# Patient Record
Sex: Female | Born: 1955
Health system: Southern US, Community
[De-identification: ages and names within clinical notes are randomized; demographics above are authoritative.]

## PROBLEM LIST (undated history)

## (undated) DIAGNOSIS — E119 Type 2 diabetes mellitus without complications: Secondary | ICD-10-CM

## (undated) DIAGNOSIS — R600 Localized edema: Secondary | ICD-10-CM

## (undated) DIAGNOSIS — E039 Hypothyroidism, unspecified: Secondary | ICD-10-CM

## (undated) DIAGNOSIS — K219 Gastro-esophageal reflux disease without esophagitis: Secondary | ICD-10-CM

## (undated) DIAGNOSIS — Z9289 Personal history of other medical treatment: Secondary | ICD-10-CM

## (undated) DIAGNOSIS — F419 Anxiety disorder, unspecified: Secondary | ICD-10-CM

## (undated) DIAGNOSIS — M1990 Unspecified osteoarthritis, unspecified site: Secondary | ICD-10-CM

## (undated) DIAGNOSIS — E785 Hyperlipidemia, unspecified: Secondary | ICD-10-CM

## (undated) DIAGNOSIS — K829 Disease of gallbladder, unspecified: Secondary | ICD-10-CM

## (undated) DIAGNOSIS — F32A Depression, unspecified: Secondary | ICD-10-CM

## (undated) DIAGNOSIS — R0600 Dyspnea, unspecified: Secondary | ICD-10-CM

## (undated) DIAGNOSIS — C801 Malignant (primary) neoplasm, unspecified: Secondary | ICD-10-CM

## (undated) DIAGNOSIS — K5900 Constipation, unspecified: Secondary | ICD-10-CM

## (undated) DIAGNOSIS — I2699 Other pulmonary embolism without acute cor pulmonale: Secondary | ICD-10-CM

## (undated) DIAGNOSIS — J45909 Unspecified asthma, uncomplicated: Secondary | ICD-10-CM

## (undated) DIAGNOSIS — F329 Major depressive disorder, single episode, unspecified: Secondary | ICD-10-CM

## (undated) DIAGNOSIS — M549 Dorsalgia, unspecified: Secondary | ICD-10-CM

## (undated) DIAGNOSIS — I10 Essential (primary) hypertension: Secondary | ICD-10-CM

## (undated) HISTORY — DX: Dorsalgia, unspecified: M54.9

## (undated) HISTORY — DX: Constipation, unspecified: K59.00

## (undated) HISTORY — DX: Essential (primary) hypertension: I10

## (undated) HISTORY — PX: BACK SURGERY: SHX140

## (undated) HISTORY — DX: Unspecified asthma, uncomplicated: J45.909

## (undated) HISTORY — DX: Disease of gallbladder, unspecified: K82.9

## (undated) HISTORY — PX: HERNIA REPAIR: SHX51

## (undated) HISTORY — DX: Hyperlipidemia, unspecified: E78.5

## (undated) HISTORY — DX: Gastro-esophageal reflux disease without esophagitis: K21.9

## (undated) HISTORY — DX: Localized edema: R60.0

## (undated) HISTORY — PX: TUBAL LIGATION: SHX77

## (undated) HISTORY — PX: OTHER SURGICAL HISTORY: SHX169

## (undated) HISTORY — PX: CYSTECTOMY: SUR359

## (undated) HISTORY — DX: Dyspnea, unspecified: R06.00

## (undated) HISTORY — DX: Type 2 diabetes mellitus without complications: E11.9

---

## 1984-01-15 DIAGNOSIS — I2699 Other pulmonary embolism without acute cor pulmonale: Secondary | ICD-10-CM

## 1984-01-15 HISTORY — DX: Other pulmonary embolism without acute cor pulmonale: I26.99

## 1985-01-14 HISTORY — PX: CHOLECYSTECTOMY: SHX55

## 1988-01-15 DIAGNOSIS — Z9289 Personal history of other medical treatment: Secondary | ICD-10-CM

## 1988-01-15 HISTORY — DX: Personal history of other medical treatment: Z92.89

## 2000-06-17 ENCOUNTER — Ambulatory Visit (HOSPITAL_COMMUNITY): Admission: RE | Admit: 2000-06-17 | Discharge: 2000-06-17 | Payer: Self-pay | Admitting: Specialist

## 2000-06-17 ENCOUNTER — Encounter: Payer: Self-pay | Admitting: Specialist

## 2000-07-30 ENCOUNTER — Other Ambulatory Visit: Admission: RE | Admit: 2000-07-30 | Discharge: 2000-07-30 | Payer: Self-pay | Admitting: Obstetrics and Gynecology

## 2001-10-23 ENCOUNTER — Ambulatory Visit (HOSPITAL_COMMUNITY): Admission: RE | Admit: 2001-10-23 | Discharge: 2001-10-23 | Payer: Self-pay | Admitting: Specialist

## 2001-10-23 ENCOUNTER — Encounter: Payer: Self-pay | Admitting: Specialist

## 2002-01-06 ENCOUNTER — Encounter: Payer: Self-pay | Admitting: General Surgery

## 2002-01-06 ENCOUNTER — Ambulatory Visit (HOSPITAL_COMMUNITY): Admission: RE | Admit: 2002-01-06 | Discharge: 2002-01-06 | Payer: Self-pay | Admitting: General Surgery

## 2002-10-29 ENCOUNTER — Encounter: Payer: Self-pay | Admitting: Specialist

## 2002-10-29 ENCOUNTER — Ambulatory Visit (HOSPITAL_COMMUNITY): Admission: RE | Admit: 2002-10-29 | Discharge: 2002-10-29 | Payer: Self-pay | Admitting: Specialist

## 2002-11-09 ENCOUNTER — Emergency Department (HOSPITAL_COMMUNITY): Admission: EM | Admit: 2002-11-09 | Discharge: 2002-11-09 | Payer: Self-pay | Admitting: Emergency Medicine

## 2003-08-19 ENCOUNTER — Ambulatory Visit (HOSPITAL_COMMUNITY): Admission: RE | Admit: 2003-08-19 | Discharge: 2003-08-19 | Payer: Self-pay | Admitting: General Surgery

## 2003-12-02 ENCOUNTER — Ambulatory Visit (HOSPITAL_COMMUNITY): Admission: RE | Admit: 2003-12-02 | Discharge: 2003-12-02 | Payer: Self-pay | Admitting: Specialist

## 2004-12-07 ENCOUNTER — Ambulatory Visit (HOSPITAL_COMMUNITY): Admission: RE | Admit: 2004-12-07 | Discharge: 2004-12-07 | Payer: Self-pay | Admitting: Obstetrics and Gynecology

## 2005-12-09 ENCOUNTER — Ambulatory Visit (HOSPITAL_COMMUNITY): Admission: RE | Admit: 2005-12-09 | Discharge: 2005-12-09 | Payer: Self-pay | Admitting: Obstetrics and Gynecology

## 2006-02-28 ENCOUNTER — Ambulatory Visit (HOSPITAL_COMMUNITY): Admission: RE | Admit: 2006-02-28 | Discharge: 2006-02-28 | Payer: Self-pay | Admitting: General Surgery

## 2006-05-19 ENCOUNTER — Ambulatory Visit (HOSPITAL_COMMUNITY): Admission: RE | Admit: 2006-05-19 | Discharge: 2006-05-19 | Payer: Self-pay | Admitting: Family Medicine

## 2006-12-12 ENCOUNTER — Ambulatory Visit (HOSPITAL_COMMUNITY): Admission: RE | Admit: 2006-12-12 | Discharge: 2006-12-12 | Payer: Self-pay | Admitting: Obstetrics and Gynecology

## 2007-02-13 ENCOUNTER — Other Ambulatory Visit: Admission: RE | Admit: 2007-02-13 | Discharge: 2007-02-13 | Payer: Self-pay | Admitting: Obstetrics and Gynecology

## 2007-12-16 ENCOUNTER — Ambulatory Visit (HOSPITAL_COMMUNITY): Admission: RE | Admit: 2007-12-16 | Discharge: 2007-12-16 | Payer: Self-pay | Admitting: Obstetrics and Gynecology

## 2008-03-16 ENCOUNTER — Other Ambulatory Visit: Admission: RE | Admit: 2008-03-16 | Discharge: 2008-03-16 | Payer: Self-pay | Admitting: Obstetrics and Gynecology

## 2008-12-16 ENCOUNTER — Ambulatory Visit (HOSPITAL_COMMUNITY): Admission: RE | Admit: 2008-12-16 | Discharge: 2008-12-16 | Payer: Self-pay | Admitting: Obstetrics and Gynecology

## 2009-03-20 ENCOUNTER — Ambulatory Visit (HOSPITAL_COMMUNITY): Admission: RE | Admit: 2009-03-20 | Discharge: 2009-03-20 | Payer: Self-pay | Admitting: Family Medicine

## 2009-08-28 ENCOUNTER — Other Ambulatory Visit: Admission: RE | Admit: 2009-08-28 | Discharge: 2009-08-28 | Payer: Self-pay | Admitting: Obstetrics and Gynecology

## 2009-11-06 ENCOUNTER — Ambulatory Visit (HOSPITAL_COMMUNITY): Admission: RE | Admit: 2009-11-06 | Discharge: 2009-11-06 | Payer: Self-pay | Admitting: Internal Medicine

## 2009-12-18 ENCOUNTER — Ambulatory Visit (HOSPITAL_COMMUNITY)
Admission: RE | Admit: 2009-12-18 | Discharge: 2009-12-18 | Payer: Self-pay | Source: Home / Self Care | Admitting: Obstetrics and Gynecology

## 2010-06-01 NOTE — H&P (Signed)
Crystal Roberts, Crystal Roberts                ACCOUNT NO.:  [id number]   MEDICAL RECORD NO.:  [id number]          PATIENT TYPE:  AMB   LOCATION:  DAY                           FACILITY:  [facility]   PHYSICIAN:  Dalia Heading, M.D.  DATE OF BIRTH:  March 04, 1955   DATE OF ADMISSION:  DATE OF DISCHARGE:  LH                              HISTORY & PHYSICAL   CHIEF COMPLAINT:  Need for screening colonoscopy.   HISTORY OF PRESENT ILLNESS:  The patient is a 55 year old white female  who is referred for endoscopic evaluation.  She needs a colonoscopy for  hematochezia.  She has been having episodes of blood per rectum  intermittently over the past few months.  No abdominal pain, weight  loss, nausea, vomiting, diarrhea, constipation, melena have been noted.  She has never had a colonoscopy.  There is no family history of colon  carcinoma.   PAST MEDICAL HISTORY:  Includes non-insulin dependent diabetes mellitus,  hypothyroidism.   PAST SURGICAL HISTORY:  Cholecystectomy, incisional herniorrhaphy,  excision of cyst on her back, tubal ligation.   CURRENT MEDICATIONS:  Levothyroxine, metformin, hydrochlorothiazide.   ALLERGIES:  SULFA.   REVIEW OF SYSTEMS:  Noncontributory.   PHYSICAL EXAMINATION:  GENERAL APPEARANCE:  The patient is a well-  developed, well-nourished white female in no acute distress.  LUNGS:  Clear to auscultation with equal breath sounds bilaterally.  HEART:  Examination reveals a regular rate and rhythm without S3, S4 or  murmurs.  ABDOMEN:  The abdomen is soft, nontender, nondistended.  No  hepatosplenomegaly or masses are noted.  RECTAL:  Examination was deferred to the procedure.   IMPRESSION:  Need for screening colonoscopy.   PLAN:  The patient is scheduled for a colonoscopy on February 28, 2006.  The risks and benefits of the procedure including bleeding and  perforation were fully explained to the patient, who gave informed  consent.      Dalia Heading, M.D.  Electronically Signed     MAJ/MEDQ  D:  02/13/2006  T:  02/13/2006  Job:  [id number]   cc:   [facility]  Fax: [phone]   Tilda Burrow, M.D.  Fax: [phone]   Corrie Mckusick, M.D.  Fax: [phone]

## 2010-06-01 NOTE — Op Note (Signed)
NAME:  Crystal Roberts, Crystal Roberts                          ACCOUNT NO.:  [id number]   MEDICAL RECORD NO.:  [id number]                   PATIENT TYPE:  AMB   LOCATION:  DAY                                  FACILITY:  [facility]   PHYSICIAN:  Dalia Heading, M.D.               DATE OF BIRTH:  16-Jul-1955   DATE OF PROCEDURE:  08/19/2003  DATE OF DISCHARGE:                                 OPERATIVE REPORT   PREOPERATIVE DIAGNOSIS:  Infected sebaceous cysts, back.   POSTOPERATIVE DIAGNOSIS:  Infected sebaceous cysts, back.   PROCEDURE:  Excision of 3-cm, infected, sebaceous cysts, back.   SURGEON:  Dalia Heading, M.D.   ANESTHESIA:  General endotracheal.   INDICATIONS:  The patient is a 55 year old white female who underwent a  limited incision and drainage of an infected sebaceous cyst.  Due to ongoing  pain and induration, the patient now comes to the operating room for formal  excision of the sebaceous cysts.  The risks and benefits of the procedure  were fully explained to the patient, who gave informed consent.   DESCRIPTION OF PROCEDURE:  The patient was placed in the right lateral  decubitus position after induction of general endotracheal anesthesia.  The  left upper back was prepped and draped using the usual sterile technique  with Betadine.  Surgical site confirmation was performed.   An oblique incision was made to include both areas of induration.  A large  sebaceous cyst with a smaller sebaceous cyst in tandem were removed.  Any  bleeding was controlled using Bovie electrocautery.  Any remaining necrotic  tissue was debrided using a curette.  The wound was injected with 0.5%  Sensorcaine.  Two 4-0 nylon sutures were placed to reapproximate the wound  loosely.  The wound was then packed with iodoform gauze.  A dry sterile  dressing was then applied.   All tape and needle counts were correct at the end of the procedure.  The  patient was extubated in the operating room and went back  to recovery room  awake, in stable condition.   COMPLICATIONS:  None.   SPECIMEN:  Sebaceous cyst, back.   BLOOD LOSS:  Minimal.      ___________________________________________                                            Dalia Heading, M.D.   MAJ/MEDQ  D:  08/19/2003  T:  08/21/2003  Job:  [id number]   cc:   Dalia Heading, M.D.  [address] [place]  [place] [address]  Fax: [phone]   Patrica Duel, M.D.  [address]  [place]  [place] [address]  Fax: [phone]

## 2010-06-01 NOTE — H&P (Signed)
NAME:  Crystal Roberts, Crystal Roberts                          ACCOUNT NO.:  [id number]   MEDICAL RECORD NO.:  [id number]                   PATIENT TYPE:  AMB   LOCATION:  DAY                                  FACILITY:  [facility]   PHYSICIAN:  Dalia Heading, M.D.               DATE OF BIRTH:  Jan 05, 1956   DATE OF ADMISSION:  DATE OF DISCHARGE:                                HISTORY & PHYSICAL   CHIEF COMPLAINT:  Multiple sebaceous cysts, back.   HISTORY OF PRESENT ILLNESS:  The patient is a 55 year old white female  referred for evaluation and treatment of an infected sebaceous cyst on her  back.  This was incised and drained at her primary care physician.  She has  been on antibiotics since that time.  She also has another area of  induration which is presumably secondary to an infected sebaceous cyst.  She  now presents to the operating room for formal excision of both sebaceous  cysts.   PAST MEDICAL HISTORY:  Hypertension.   PAST SURGICAL HISTORY:  1. Cholecystectomy.  2. Herniorrhaphy.   CURRENT MEDICATIONS:  Blood pressure pill, hydrocodone, an aspirin which she  is holding.   ALLERGIES:  SULFA.   REVIEW OF SYSTEMS:  The patient denies drinking or smoking.  She denies any  other cardiopulmonary difficulties or bleeding disorders.   PHYSICAL EXAMINATION:  GENERAL:  The patient is a well-developed, well-  nourished white female in no acute distress.  VITAL SIGNS:  She is afebrile and vital signs are stable.  LUNGS:  Clear to auscultation with equal breath sounds bilaterally.  HEART:  Regular rate and rhythm without S3, S4, or murmurs.  BACK:  Two areas of induration with punctums present.  No drainage is noted  presently.  Both are greater than 2 cm in size.   IMPRESSION:  Resolving infected sebaceous cysts, back.   PLAN:  The patient is scheduled for excision of the sebaceous cysts, back,  on August 19, 2003.  The risks and benefits of the procedure including  bleeding, infection, and  recurrence of the cysts were fully explained to the  patient who gave informed consent.     ___________________________________________                                         Dalia Heading, M.D.   MAJ/MEDQ  D:  08/16/2003  T:  08/16/2003  Job:  [id number]   cc:   Patrica Duel, M.D.  [address]  [place]  [place] [address]  Fax: [phone]

## 2010-11-15 ENCOUNTER — Other Ambulatory Visit: Payer: Self-pay | Admitting: Obstetrics and Gynecology

## 2010-11-15 DIAGNOSIS — Z139 Encounter for screening, unspecified: Secondary | ICD-10-CM

## 2010-12-25 ENCOUNTER — Ambulatory Visit (HOSPITAL_COMMUNITY)
Admission: RE | Admit: 2010-12-25 | Discharge: 2010-12-25 | Disposition: A | Discharge status: Home / Self Care | Payer: BC Managed Care – PPO | Source: Ambulatory Visit | Attending: Obstetrics and Gynecology | Admitting: Obstetrics and Gynecology

## 2010-12-25 DIAGNOSIS — Z139 Encounter for screening, unspecified: Secondary | ICD-10-CM

## 2010-12-25 DIAGNOSIS — Z1231 Encounter for screening mammogram for malignant neoplasm of breast: Secondary | ICD-10-CM | POA: Insufficient documentation

## 2010-12-26 ENCOUNTER — Other Ambulatory Visit: Payer: Self-pay | Admitting: Obstetrics and Gynecology

## 2010-12-26 ENCOUNTER — Other Ambulatory Visit (HOSPITAL_COMMUNITY)
Admission: RE | Admit: 2010-12-26 | Discharge: 2010-12-26 | Disposition: A | Discharge status: Home / Self Care | Payer: BC Managed Care – PPO | Source: Ambulatory Visit | Attending: Obstetrics and Gynecology | Admitting: Obstetrics and Gynecology

## 2010-12-26 DIAGNOSIS — Z01419 Encounter for gynecological examination (general) (routine) without abnormal findings: Secondary | ICD-10-CM | POA: Insufficient documentation

## 2011-12-17 ENCOUNTER — Other Ambulatory Visit (HOSPITAL_COMMUNITY): Payer: Self-pay | Admitting: Family Medicine

## 2011-12-17 DIAGNOSIS — Z139 Encounter for screening, unspecified: Secondary | ICD-10-CM

## 2011-12-27 ENCOUNTER — Ambulatory Visit (HOSPITAL_COMMUNITY)
Admission: RE | Admit: 2011-12-27 | Discharge: 2011-12-27 | Disposition: A | Discharge status: Home / Self Care | Payer: BC Managed Care – PPO | Source: Ambulatory Visit | Attending: Family Medicine | Admitting: Family Medicine

## 2011-12-27 DIAGNOSIS — Z1231 Encounter for screening mammogram for malignant neoplasm of breast: Secondary | ICD-10-CM | POA: Insufficient documentation

## 2011-12-27 DIAGNOSIS — Z139 Encounter for screening, unspecified: Secondary | ICD-10-CM

## 2012-05-26 ENCOUNTER — Ambulatory Visit (HOSPITAL_COMMUNITY)
Admission: RE | Admit: 2012-05-26 | Discharge: 2012-05-26 | Disposition: A | Discharge status: Home / Self Care | Payer: BC Managed Care – PPO | Source: Ambulatory Visit | Attending: Family Medicine | Admitting: Family Medicine

## 2012-05-26 ENCOUNTER — Other Ambulatory Visit (HOSPITAL_COMMUNITY): Payer: Self-pay | Admitting: Family Medicine

## 2012-05-26 DIAGNOSIS — M25532 Pain in left wrist: Secondary | ICD-10-CM

## 2012-05-26 DIAGNOSIS — M25539 Pain in unspecified wrist: Secondary | ICD-10-CM | POA: Insufficient documentation

## 2012-11-17 ENCOUNTER — Other Ambulatory Visit (HOSPITAL_COMMUNITY): Payer: Self-pay | Admitting: Family Medicine

## 2012-11-17 DIAGNOSIS — Z139 Encounter for screening, unspecified: Secondary | ICD-10-CM

## 2012-12-07 ENCOUNTER — Other Ambulatory Visit: Payer: Self-pay | Admitting: Obstetrics and Gynecology

## 2012-12-07 ENCOUNTER — Encounter: Payer: Self-pay | Admitting: *Deleted

## 2012-12-28 ENCOUNTER — Ambulatory Visit (HOSPITAL_COMMUNITY): Payer: BC Managed Care – PPO

## 2012-12-30 ENCOUNTER — Other Ambulatory Visit: Payer: Self-pay | Admitting: Obstetrics and Gynecology

## 2013-01-20 ENCOUNTER — Other Ambulatory Visit (HOSPITAL_COMMUNITY)
Admission: RE | Admit: 2013-01-20 | Discharge: 2013-01-20 | Disposition: A | Discharge status: Home / Self Care | Payer: BC Managed Care – PPO | Source: Ambulatory Visit | Attending: Obstetrics and Gynecology | Admitting: Obstetrics and Gynecology

## 2013-01-20 ENCOUNTER — Encounter (INDEPENDENT_AMBULATORY_CARE_PROVIDER_SITE_OTHER): Payer: Self-pay

## 2013-01-20 ENCOUNTER — Encounter: Payer: Self-pay | Admitting: Obstetrics and Gynecology

## 2013-01-20 ENCOUNTER — Ambulatory Visit (INDEPENDENT_AMBULATORY_CARE_PROVIDER_SITE_OTHER): Payer: BC Managed Care – PPO | Admitting: Obstetrics and Gynecology

## 2013-01-20 VITALS — BP 142/80 | Ht 65.0 in | Wt 256.0 lb

## 2013-01-20 DIAGNOSIS — Z01419 Encounter for gynecological examination (general) (routine) without abnormal findings: Secondary | ICD-10-CM

## 2013-01-20 DIAGNOSIS — Z1151 Encounter for screening for human papillomavirus (HPV): Secondary | ICD-10-CM | POA: Insufficient documentation

## 2013-01-20 DIAGNOSIS — Z1212 Encounter for screening for malignant neoplasm of rectum: Secondary | ICD-10-CM

## 2013-01-20 LAB — HEMOCCULT GUIAC POC 1CARD (OFFICE): FECAL OCCULT BLD: NEGATIVE

## 2013-01-20 NOTE — Progress Notes (Signed)
Patient ID: Crystal Roberts, female   DOB: February 04, 1955, 58 y.o.   MRN: [id number]  Assessment:  Annual Gyn Exam   Plan:  1. pap smear done, next pap due 3 yr 2. return annually or prn 3    Annual mammogram advised Subjective:  Crystal Roberts is a 58 y.o. female No obstetric history on file. who presents for annual exam. No LMP recorded. Patient is postmenopausal. The patient has complaints today of weight gain x 30 lb / 1 yr. Diet change acknowledged   The following portions of the patient's history were reviewed and updated as appropriate: allergies, current medications, past family history, past medical history, past social history, past surgical history and problem list.  Review of Systems Constitutional: negative Gastrointestinal: negative Genitourinary: Denies stress incontinence pelvic discomfort other than the mild lower abdominal ache nonspecific, no postmenopausal bleeding  Objective:  There were no vitals taken for this visit. weight 256 pounds pulse 70 blood pressure 142/80 height 65 inches  BMI: There is no height or weight on file to calculate BMI.  General Appearance: Alert, appropriate appearance for age. No acute distress HEENT: Grossly normal  crackling lower lip from fever blister, being treated Neck / Thyroid:  Cardiovascular: RRR; normal S1, S2, no murmur Lungs: CTA bilaterally Back: No CVAT Breast Exam: No dimpling, nipple retraction or discharge. No masses or nodes. and right breast slightly larger than left, unchanged, axilla negative Gastrointestinal: Soft, non-tender, no masses or organomegaly Pelvic Exam: Vulva and vagina appear normal. Bimanual exam reveals normal uterus and adnexa. Good support Rectovaginal: normal rectal, no masses and guaiac negative stool obtained Lymphatic Exam: Non-palpable nodes in neck, clavicular, axillary, or inguinal regions Skin: no rash or abnormalities Neurologic: Normal gait and speech, no tremor  Psychiatric: Alert and  oriented, appropriate affect.  Urinalysis:Not done  Mallory Shirk. MD Pgr [phone] 8:33 AM

## 2013-01-20 NOTE — Patient Instructions (Signed)
Your BMI is 42 please buy a step meter

## 2013-02-02 ENCOUNTER — Ambulatory Visit (HOSPITAL_COMMUNITY)
Admission: RE | Admit: 2013-02-02 | Discharge: 2013-02-02 | Disposition: A | Discharge status: Home / Self Care | Payer: BC Managed Care – PPO | Source: Ambulatory Visit | Attending: Family Medicine | Admitting: Family Medicine

## 2013-02-02 DIAGNOSIS — Z139 Encounter for screening, unspecified: Secondary | ICD-10-CM

## 2013-02-02 DIAGNOSIS — Z1231 Encounter for screening mammogram for malignant neoplasm of breast: Secondary | ICD-10-CM | POA: Insufficient documentation

## 2014-01-10 ENCOUNTER — Other Ambulatory Visit (HOSPITAL_COMMUNITY): Payer: Self-pay | Admitting: Family Medicine

## 2014-01-10 DIAGNOSIS — Z1231 Encounter for screening mammogram for malignant neoplasm of breast: Secondary | ICD-10-CM

## 2014-01-24 ENCOUNTER — Other Ambulatory Visit: Payer: BC Managed Care – PPO | Admitting: Obstetrics and Gynecology

## 2014-02-03 ENCOUNTER — Ambulatory Visit (HOSPITAL_COMMUNITY): Payer: BC Managed Care – PPO

## 2014-02-03 ENCOUNTER — Other Ambulatory Visit: Payer: Self-pay | Admitting: Obstetrics and Gynecology

## 2014-04-01 ENCOUNTER — Ambulatory Visit (HOSPITAL_COMMUNITY)
Admission: RE | Admit: 2014-04-01 | Discharge: 2014-04-01 | Disposition: A | Discharge status: Home / Self Care | Payer: BC Managed Care – PPO | Source: Ambulatory Visit | Attending: Family Medicine | Admitting: Family Medicine

## 2014-04-01 DIAGNOSIS — Z1231 Encounter for screening mammogram for malignant neoplasm of breast: Secondary | ICD-10-CM | POA: Insufficient documentation

## 2015-03-20 MED FILL — BUPROPION HCL XL 150 MG TAB: 150 | 90 days supply | Qty: 90 | Fill #0

## 2015-03-20 MED FILL — SIMVASTATIN 40 MG TABLET: 40 | 90 days supply | Qty: 90 | Fill #0

## 2015-03-20 MED FILL — NAPROXEN 500 MG TABLET: 500 | 30 days supply | Qty: 60 | Fill #0

## 2015-03-21 ENCOUNTER — Other Ambulatory Visit (HOSPITAL_COMMUNITY): Payer: Self-pay | Admitting: Family Medicine

## 2015-03-21 DIAGNOSIS — Z1231 Encounter for screening mammogram for malignant neoplasm of breast: Secondary | ICD-10-CM

## 2015-04-04 MED FILL — LOSARTAN-HCTZ 100-25 MG TAB: 100-25 | 90 days supply | Qty: 90 | Fill #0

## 2015-04-04 MED FILL — FREESTYLE LITE TEST STRIP: 25 days supply | Qty: 50 | Fill #0

## 2015-04-04 MED FILL — MELOXICAM 15 MG TABLET: 15 | 90 days supply | Qty: 90 | Fill #0

## 2015-04-07 ENCOUNTER — Ambulatory Visit (HOSPITAL_COMMUNITY)
Admission: RE | Admit: 2015-04-07 | Discharge: 2015-04-07 | Disposition: A | Discharge status: Home / Self Care | Payer: 59 | Source: Ambulatory Visit | Attending: Family Medicine | Admitting: Family Medicine

## 2015-04-07 DIAGNOSIS — Z1231 Encounter for screening mammogram for malignant neoplasm of breast: Secondary | ICD-10-CM | POA: Diagnosis not present

## 2015-04-09 DIAGNOSIS — E119 Type 2 diabetes mellitus without complications: Secondary | ICD-10-CM | POA: Diagnosis not present

## 2015-04-11 MED FILL — metFORMIN HCL 500 MG TABS: 500 | 30 days supply | Qty: 120 | Fill #0

## 2015-04-20 DIAGNOSIS — I10 Essential (primary) hypertension: Secondary | ICD-10-CM | POA: Diagnosis not present

## 2015-04-20 DIAGNOSIS — Z6841 Body Mass Index (BMI) 40.0 and over, adult: Secondary | ICD-10-CM | POA: Diagnosis not present

## 2015-04-20 DIAGNOSIS — J209 Acute bronchitis, unspecified: Secondary | ICD-10-CM | POA: Diagnosis not present

## 2015-04-20 DIAGNOSIS — E119 Type 2 diabetes mellitus without complications: Secondary | ICD-10-CM | POA: Diagnosis not present

## 2015-04-20 DIAGNOSIS — J069 Acute upper respiratory infection, unspecified: Secondary | ICD-10-CM | POA: Diagnosis not present

## 2015-04-20 DIAGNOSIS — Z1389 Encounter for screening for other disorder: Secondary | ICD-10-CM | POA: Diagnosis not present

## 2015-04-20 MED FILL — CYCLOBENZAPRINE 10 MG TAB: 10 | 20 days supply | Qty: 60 | Fill #0

## 2015-04-26 MED FILL — LEVOTHYROXINE 75 MCG TABLET: 75 | 90 days supply | Qty: 90 | Fill #0

## 2015-05-26 MED FILL — BUPROPION HCL XL 300 MG TAB: 300 | 90 days supply | Qty: 90 | Fill #0

## 2015-05-29 MED FILL — metFORMIN HCL 500 MG TABS: 500 | 30 days supply | Qty: 120 | Fill #1

## 2015-06-20 MED FILL — SIMVASTATIN 40 MG TABLET: 40 | 90 days supply | Qty: 90 | Fill #1

## 2015-07-03 MED FILL — MELOXICAM 15 MG TABLET: 15 | 90 days supply | Qty: 90 | Fill #1

## 2015-07-03 MED FILL — metFORMIN HCL 500 MG TABS: 500 | 30 days supply | Qty: 120 | Fill #0

## 2015-07-25 MED FILL — LOSARTAN-HCTZ 100-25 MG TAB: 100-25 | 90 days supply | Qty: 90 | Fill #1

## 2015-07-25 MED FILL — LEVOTHYROXINE 75 MCG TABLET: 75 | 90 days supply | Qty: 90 | Fill #1

## 2015-08-21 MED FILL — BUPROPION HCL XL 300 MG TAB: 300 | 90 days supply | Qty: 90 | Fill #1

## 2015-08-23 MED FILL — metFORMIN HCL 500 MG TABS: 500 | 30 days supply | Qty: 120 | Fill #0

## 2015-08-28 DIAGNOSIS — M310 Hypersensitivity angiitis: Secondary | ICD-10-CM | POA: Diagnosis not present

## 2015-08-28 DIAGNOSIS — Z1389 Encounter for screening for other disorder: Secondary | ICD-10-CM | POA: Diagnosis not present

## 2015-08-28 DIAGNOSIS — M9903 Segmental and somatic dysfunction of lumbar region: Secondary | ICD-10-CM | POA: Diagnosis not present

## 2015-08-28 DIAGNOSIS — M5432 Sciatica, left side: Secondary | ICD-10-CM | POA: Diagnosis not present

## 2015-08-28 DIAGNOSIS — E119 Type 2 diabetes mellitus without complications: Secondary | ICD-10-CM | POA: Diagnosis not present

## 2015-08-28 DIAGNOSIS — Z6841 Body Mass Index (BMI) 40.0 and over, adult: Secondary | ICD-10-CM | POA: Diagnosis not present

## 2015-08-28 DIAGNOSIS — M791 Myalgia: Secondary | ICD-10-CM | POA: Diagnosis not present

## 2015-08-28 DIAGNOSIS — M9904 Segmental and somatic dysfunction of sacral region: Secondary | ICD-10-CM | POA: Diagnosis not present

## 2015-09-01 DIAGNOSIS — M9903 Segmental and somatic dysfunction of lumbar region: Secondary | ICD-10-CM | POA: Diagnosis not present

## 2015-09-01 DIAGNOSIS — M9904 Segmental and somatic dysfunction of sacral region: Secondary | ICD-10-CM | POA: Diagnosis not present

## 2015-09-01 DIAGNOSIS — M791 Myalgia: Secondary | ICD-10-CM | POA: Diagnosis not present

## 2015-09-01 DIAGNOSIS — M5432 Sciatica, left side: Secondary | ICD-10-CM | POA: Diagnosis not present

## 2015-09-08 DIAGNOSIS — M9904 Segmental and somatic dysfunction of sacral region: Secondary | ICD-10-CM | POA: Diagnosis not present

## 2015-09-08 DIAGNOSIS — M9903 Segmental and somatic dysfunction of lumbar region: Secondary | ICD-10-CM | POA: Diagnosis not present

## 2015-09-08 DIAGNOSIS — M791 Myalgia: Secondary | ICD-10-CM | POA: Diagnosis not present

## 2015-09-08 DIAGNOSIS — M5432 Sciatica, left side: Secondary | ICD-10-CM | POA: Diagnosis not present

## 2015-09-19 MED FILL — SIMVASTATIN 40 MG TABLET: 40 | 30 days supply | Qty: 30 | Fill #2

## 2015-10-02 DIAGNOSIS — L708 Other acne: Secondary | ICD-10-CM | POA: Diagnosis not present

## 2015-10-02 DIAGNOSIS — Z08 Encounter for follow-up examination after completed treatment for malignant neoplasm: Secondary | ICD-10-CM | POA: Diagnosis not present

## 2015-10-02 DIAGNOSIS — Z85828 Personal history of other malignant neoplasm of skin: Secondary | ICD-10-CM | POA: Diagnosis not present

## 2015-10-05 MED FILL — MELOXICAM 15 MG TABLET: 15 | 30 days supply | Qty: 30 | Fill #0

## 2015-10-05 MED FILL — metFORMIN HCL 500 MG TABS: 500 | 30 days supply | Qty: 120 | Fill #0

## 2015-10-17 MED FILL — SIMVASTATIN 40 MG TABLET: 40 | 90 days supply | Qty: 90 | Fill #0

## 2015-10-18 MED FILL — LOSARTAN-HCTZ 100-25 MG TAB: 100-25 | 30 days supply | Qty: 30 | Fill #2

## 2015-10-26 DIAGNOSIS — Z6841 Body Mass Index (BMI) 40.0 and over, adult: Secondary | ICD-10-CM | POA: Diagnosis not present

## 2015-10-26 DIAGNOSIS — E039 Hypothyroidism, unspecified: Secondary | ICD-10-CM | POA: Diagnosis not present

## 2015-10-26 DIAGNOSIS — I10 Essential (primary) hypertension: Secondary | ICD-10-CM | POA: Diagnosis not present

## 2015-10-26 DIAGNOSIS — E782 Mixed hyperlipidemia: Secondary | ICD-10-CM | POA: Diagnosis not present

## 2015-10-26 DIAGNOSIS — E119 Type 2 diabetes mellitus without complications: Secondary | ICD-10-CM | POA: Diagnosis not present

## 2015-10-26 DIAGNOSIS — Z1389 Encounter for screening for other disorder: Secondary | ICD-10-CM | POA: Diagnosis not present

## 2015-10-26 MED FILL — CYCLOBENZAPRINE 10 MG TAB: 10 | 20 days supply | Qty: 60 | Fill #1

## 2015-11-02 MED FILL — LEVOTHYROXINE 75 MCG TABLET: 75 | 90 days supply | Qty: 90 | Fill #2

## 2015-11-03 MED FILL — MELOXICAM 15 MG TABLET: 15 | 30 days supply | Qty: 30 | Fill #0

## 2015-11-17 MED FILL — metFORMIN HCL 500 MG TABS: 500 | 30 days supply | Qty: 120 | Fill #0

## 2015-11-20 MED FILL — BUPROPION HCL XL 300 MG TAB: 300 | 90 days supply | Qty: 90 | Fill #2

## 2015-11-21 MED FILL — LOSARTAN-HCTZ 100-25 MG TAB: 100-25 | 90 days supply | Qty: 90 | Fill #0

## 2015-12-11 MED FILL — MELOXICAM 15 MG TABLET: 15 | 30 days supply | Qty: 30 | Fill #0

## 2015-12-26 MED FILL — metFORMIN HCL 500 MG TABS: 500 | 30 days supply | Qty: 120 | Fill #1

## 2016-01-17 MED FILL — MELOXICAM 15 MG TABLET: 15 | 30 days supply | Qty: 30 | Fill #0

## 2016-01-29 MED FILL — SIMVASTATIN 40 MG TABLET: 40 | 90 days supply | Qty: 90 | Fill #1

## 2016-01-29 MED FILL — LEVOTHYROXINE 75 MCG TABLET: 75 | 90 days supply | Qty: 90 | Fill #0

## 2016-02-06 MED FILL — metFORMIN HCL 500 MG TABS: 500 | 30 days supply | Qty: 120 | Fill #0

## 2016-02-15 MED FILL — LOSARTAN-HCTZ 100-25 MG TAB: 100-25 | 90 days supply | Qty: 90 | Fill #1

## 2016-02-15 MED FILL — MELOXICAM 15 MG TABLET: 15 | 30 days supply | Qty: 30 | Fill #1

## 2016-03-04 MED FILL — BUPROPION HCL XL 300 MG TAB: 300 | 90 days supply | Qty: 90 | Fill #3

## 2016-03-11 DIAGNOSIS — Z1389 Encounter for screening for other disorder: Secondary | ICD-10-CM | POA: Diagnosis not present

## 2016-03-11 DIAGNOSIS — J209 Acute bronchitis, unspecified: Secondary | ICD-10-CM | POA: Diagnosis not present

## 2016-03-11 DIAGNOSIS — R070 Pain in throat: Secondary | ICD-10-CM | POA: Diagnosis not present

## 2016-03-11 DIAGNOSIS — J343 Hypertrophy of nasal turbinates: Secondary | ICD-10-CM | POA: Diagnosis not present

## 2016-03-11 DIAGNOSIS — Z681 Body mass index (BMI) 19 or less, adult: Secondary | ICD-10-CM | POA: Diagnosis not present

## 2016-03-11 DIAGNOSIS — E063 Autoimmune thyroiditis: Secondary | ICD-10-CM | POA: Diagnosis not present

## 2016-03-11 DIAGNOSIS — J069 Acute upper respiratory infection, unspecified: Secondary | ICD-10-CM | POA: Diagnosis not present

## 2016-03-11 DIAGNOSIS — E782 Mixed hyperlipidemia: Secondary | ICD-10-CM | POA: Diagnosis not present

## 2016-03-11 DIAGNOSIS — E119 Type 2 diabetes mellitus without complications: Secondary | ICD-10-CM | POA: Diagnosis not present

## 2016-03-11 MED FILL — OSELTAMIVIR PHOSPHATE 75 MG: 75 | 5 days supply | Qty: 10 | Fill #0

## 2016-03-11 MED FILL — AMOX TR-K CLV 875-125 MG TA: 875-125 | 10 days supply | Qty: 20 | Fill #0

## 2016-03-20 MED FILL — MELOXICAM 15 MG TABLET: 15 | 30 days supply | Qty: 30 | Fill #2

## 2016-03-21 MED FILL — metFORMIN HCL 500 MG TABS: 500 | 30 days supply | Qty: 120 | Fill #0

## 2016-04-24 DIAGNOSIS — M5412 Radiculopathy, cervical region: Secondary | ICD-10-CM | POA: Diagnosis not present

## 2016-04-24 DIAGNOSIS — Z6841 Body Mass Index (BMI) 40.0 and over, adult: Secondary | ICD-10-CM | POA: Diagnosis not present

## 2016-04-24 DIAGNOSIS — M542 Cervicalgia: Secondary | ICD-10-CM | POA: Diagnosis not present

## 2016-04-24 DIAGNOSIS — Z1389 Encounter for screening for other disorder: Secondary | ICD-10-CM | POA: Diagnosis not present

## 2016-04-24 MED FILL — CYCLOBENZAPRINE 10 MG TAB: 10 | 20 days supply | Qty: 60 | Fill #0

## 2016-04-25 ENCOUNTER — Other Ambulatory Visit (HOSPITAL_COMMUNITY): Payer: Self-pay | Admitting: Family Medicine

## 2016-04-25 DIAGNOSIS — M5412 Radiculopathy, cervical region: Secondary | ICD-10-CM

## 2016-04-25 DIAGNOSIS — M542 Cervicalgia: Secondary | ICD-10-CM

## 2016-05-01 ENCOUNTER — Ambulatory Visit (HOSPITAL_COMMUNITY)
Admission: RE | Admit: 2016-05-01 | Discharge: 2016-05-01 | Disposition: A | Discharge status: Home / Self Care | Payer: 59 | Source: Ambulatory Visit | Attending: Family Medicine | Admitting: Family Medicine

## 2016-05-01 DIAGNOSIS — M542 Cervicalgia: Secondary | ICD-10-CM | POA: Diagnosis not present

## 2016-05-01 DIAGNOSIS — R221 Localized swelling, mass and lump, neck: Secondary | ICD-10-CM | POA: Diagnosis not present

## 2016-05-01 DIAGNOSIS — M5412 Radiculopathy, cervical region: Secondary | ICD-10-CM | POA: Diagnosis not present

## 2016-05-03 DIAGNOSIS — E119 Type 2 diabetes mellitus without complications: Secondary | ICD-10-CM | POA: Diagnosis not present

## 2016-05-03 DIAGNOSIS — Z6839 Body mass index (BMI) 39.0-39.9, adult: Secondary | ICD-10-CM | POA: Diagnosis not present

## 2016-05-03 DIAGNOSIS — I10 Essential (primary) hypertension: Secondary | ICD-10-CM | POA: Diagnosis not present

## 2016-05-03 DIAGNOSIS — M542 Cervicalgia: Secondary | ICD-10-CM | POA: Diagnosis not present

## 2016-05-03 DIAGNOSIS — Z1389 Encounter for screening for other disorder: Secondary | ICD-10-CM | POA: Diagnosis not present

## 2016-05-03 DIAGNOSIS — E063 Autoimmune thyroiditis: Secondary | ICD-10-CM | POA: Diagnosis not present

## 2016-05-03 DIAGNOSIS — E039 Hypothyroidism, unspecified: Secondary | ICD-10-CM | POA: Diagnosis not present

## 2016-05-03 DIAGNOSIS — E782 Mixed hyperlipidemia: Secondary | ICD-10-CM | POA: Diagnosis not present

## 2016-05-06 MED FILL — LEVOTHYROXINE 75 MCG TABLET: 75 | 90 days supply | Qty: 90 | Fill #1

## 2016-05-07 MED FILL — metFORMIN HCL 500 MG TABS: 500 | 90 days supply | Qty: 360 | Fill #0

## 2016-05-07 MED FILL — SIMVASTATIN 40 MG TABLET: 40 | 90 days supply | Qty: 90 | Fill #0

## 2016-05-07 MED FILL — MELOXICAM 15 MG TABLET: 15 | 90 days supply | Qty: 90 | Fill #0

## 2016-05-08 ENCOUNTER — Other Ambulatory Visit (HOSPITAL_COMMUNITY): Payer: Self-pay | Admitting: Family Medicine

## 2016-05-10 ENCOUNTER — Other Ambulatory Visit (HOSPITAL_COMMUNITY): Payer: Self-pay | Admitting: Family Medicine

## 2016-05-10 DIAGNOSIS — R9389 Abnormal findings on diagnostic imaging of other specified body structures: Secondary | ICD-10-CM

## 2016-05-15 ENCOUNTER — Ambulatory Visit (HOSPITAL_COMMUNITY)
Admission: RE | Admit: 2016-05-15 | Discharge: 2016-05-15 | Disposition: A | Discharge status: Home / Self Care | Payer: 59 | Source: Ambulatory Visit | Attending: Family Medicine | Admitting: Family Medicine

## 2016-05-15 DIAGNOSIS — D170 Benign lipomatous neoplasm of skin and subcutaneous tissue of head, face and neck: Secondary | ICD-10-CM | POA: Insufficient documentation

## 2016-05-15 DIAGNOSIS — M4802 Spinal stenosis, cervical region: Secondary | ICD-10-CM | POA: Diagnosis not present

## 2016-05-15 DIAGNOSIS — M50221 Other cervical disc displacement at C4-C5 level: Secondary | ICD-10-CM | POA: Insufficient documentation

## 2016-05-15 DIAGNOSIS — R9389 Abnormal findings on diagnostic imaging of other specified body structures: Secondary | ICD-10-CM

## 2016-05-15 DIAGNOSIS — R938 Abnormal findings on diagnostic imaging of other specified body structures: Secondary | ICD-10-CM | POA: Diagnosis present

## 2016-05-17 DIAGNOSIS — M5030 Other cervical disc degeneration, unspecified cervical region: Secondary | ICD-10-CM | POA: Diagnosis not present

## 2016-05-17 DIAGNOSIS — Z6839 Body mass index (BMI) 39.0-39.9, adult: Secondary | ICD-10-CM | POA: Diagnosis not present

## 2016-05-17 DIAGNOSIS — Z1389 Encounter for screening for other disorder: Secondary | ICD-10-CM | POA: Diagnosis not present

## 2016-05-17 DIAGNOSIS — D179 Benign lipomatous neoplasm, unspecified: Secondary | ICD-10-CM | POA: Diagnosis not present

## 2016-05-17 MED FILL — FREESTYLE LITE TEST STRIP: 50 days supply | Qty: 100 | Fill #0

## 2016-05-20 MED FILL — LOSARTAN-HCTZ 100-25 MG TAB: 100-25 | 90 days supply | Qty: 90 | Fill #2

## 2016-06-03 MED FILL — BUPROPION HCL XL 300 MG TAB: 300 | 90 days supply | Qty: 90 | Fill #0

## 2016-06-16 DIAGNOSIS — E119 Type 2 diabetes mellitus without complications: Secondary | ICD-10-CM | POA: Diagnosis not present

## 2016-06-16 DIAGNOSIS — H524 Presbyopia: Secondary | ICD-10-CM | POA: Diagnosis not present

## 2016-07-24 MED FILL — FREESTYLE LITE TEST STRIP: 50 days supply | Qty: 100 | Fill #1

## 2016-07-29 DIAGNOSIS — I10 Essential (primary) hypertension: Secondary | ICD-10-CM | POA: Diagnosis not present

## 2016-07-29 DIAGNOSIS — M542 Cervicalgia: Secondary | ICD-10-CM | POA: Diagnosis not present

## 2016-07-29 DIAGNOSIS — Z6841 Body Mass Index (BMI) 40.0 and over, adult: Secondary | ICD-10-CM | POA: Diagnosis not present

## 2016-07-29 DIAGNOSIS — M5020 Other cervical disc displacement, unspecified cervical region: Secondary | ICD-10-CM | POA: Diagnosis not present

## 2016-07-29 DIAGNOSIS — M5412 Radiculopathy, cervical region: Secondary | ICD-10-CM | POA: Diagnosis not present

## 2016-07-30 ENCOUNTER — Other Ambulatory Visit: Payer: Self-pay | Admitting: Neurosurgery

## 2016-08-06 MED FILL — LEVOTHYROXINE 75 MCG TABLET: 75 | 90 days supply | Qty: 90 | Fill #2

## 2016-08-07 MED FILL — SIMVASTATIN 40 MG TABLET: 40 | 90 days supply | Qty: 90 | Fill #1

## 2016-08-12 ENCOUNTER — Other Ambulatory Visit (HOSPITAL_COMMUNITY): Payer: 59

## 2016-08-12 NOTE — H&P (Signed)
Patient ID:   [id number] Patient: Crystal Roberts  Date of Birth: 1955-07-03 Visit Type: Office Visit   Date: 07/29/2016 03:30 PM Provider: Marchia Meiers. Vertell Limber MD   This 61 year old female presents for neck pain.   History of Present Illness: 1.  neck pain  Crystal Roberts, 61 year old female employed with [employer], visits for evaluation of neck right shoulder, and right arm pain, numbness, weakness.  She recalls no injury, stating symptoms have been increasing since March.  Home exercise has increased range of motion  Mobic 15 milligrams daily  Norco and Flexeril taken very rarely  History:  HTN, NIDDM, hypothyroidism Surgical history:  Sebaceous cyst on back 2009, 1997cholecystectomy, 1997 incisional hernia, ovarian cyst/tubal ligation  CT and MRI, x-ray on [place]  MRI of the cervical spine shows a significant disc herniation   at the C4-5 level with a right sided  C5 nerve root compression.   I reviewed the soft tissue MRI which was suggestive a small lipoma and I told her that I do not think that the lipoma is the problem  but that the disc herniation is.  She also has spinal  stenosis at the C4-5 level.    The patient is complaining of neck and right  shoulder and arm pain.  She says this is been going on since March.  She has not been able  to get any relief from the discomfort.           PAST MEDICAL/SURGICAL HISTORY   (Detailed)  Disease/disorder Onset Date Management Date Comments    sebaceous cyst on back removed 2009     incisional hernia 1997     ovarian cyst/tubal ligation    hypothyroidism        Cholecystectomy 1997   Diabetes      Hypertension         PAST MEDICAL HISTORY, SURGICAL HISTORY, FAMILY HISTORY, SOCIAL HISTORY AND REVIEW OF SYSTEMS I have reviewed the patient's past medical, surgical, family and social history as well as the comprehensive review of systems as included on the [facility] history  form dated 07/29/2016, which I have signed.  Family History  (Detailed) Relationship Family Member Name Deceased Age at Death Condition Onset Age Cause of Death      Family history of Melanoma  N      Family history of Diabetes mellitus  N      Family history of Cardiovascular disease  N      Family history of Hypertension  N     Social History:  (Detailed) Tobacco use reviewed. Preferred language is Unknown.   Tobacco use status: Current non-smoker. Smoking status: Never smoker.  SMOKING STATUS Type Smoking Status Usage Per Day Years Used Total Pack Years   Never smoker          MEDICATIONS(added, continued or stopped this visit): Started Medication Directions Instruction Stopped   Aspirin Low Dose 81 mg tablet,delayed release take 1 tablet by oral route  every day     Ibuprofen  ORAL TABLET      levothyroxine 75 mcg tablet take 1 tablet by oral route  every day     losartan 100 mg-hydrochlorothiazide 25 mg tablet take 1 tablet by oral route  every day     meloxicam 15 mg tablet take 1 tablet by oral route  every day     metformin ER 750 mg tablet,extended release 24 hr take 1 tablet by oral route  2 times every day with the evening meal     simvastatin 40 mg tablet take 1 tablet by oral route  every day in the evening       ALLERGIES: Ingredient Reaction Medication Name Comment  SULFA (SULFONAMIDE ANTIBIOTICS) Rash     Reviewed, updated.   Review of Systems System Neg/Pos Details  Constitutional Negative Chills, Fatigue, Fever, Malaise, Night sweats, Weight gain and Weight loss.  ENMT Negative Ear drainage, Hearing loss, Nasal drainage, Otalgia, Sinus pressure and Sore throat.  Eyes Negative Eye discharge, Eye pain and Vision changes.  Respiratory Negative Chronic cough, Cough, Dyspnea, Known TB exposure and Wheezing.  Cardio Negative Chest pain, Claudication, Edema and Irregular heartbeat/palpitations.  GI Negative Abdominal pain, Blood in stool, Change in stool  pattern, Constipation, Decreased appetite, Diarrhea, Heartburn, Nausea and Vomiting.  GU Negative Dysuria, Hematuria, Polyuria (Genitourinary), Urinary frequency, Urinary incontinence and Urinary retention.  Endocrine Negative Cold intolerance, Heat intolerance, Polydipsia and Polyphagia.  Neuro Positive Extremity weakness, Numbness in extremity.  Psych Negative Anxiety, Depression and Insomnia.  Integumentary Negative Brittle hair, Brittle nails, Change in shape/size of mole(s), Hair loss, Hirsutism, Hives, Pruritus, Rash and Skin lesion.  MS Positive Neck pain.  Hema/Lymph Negative Easy bleeding, Easy bruising and Lymphadenopathy.  Allergic/Immuno Negative Contact allergy, Environmental allergies, Food allergies and Seasonal allergies.  Reproductive Negative Breast discharge, Breast lumps, Dysmenorrhea, Dyspareunia, History of abnormal PAP smear, Hot flashes, Irregular menses and Vaginal discharge.   Vitals Date Temp F BP Pulse Ht In Wt Lb BMI BSA Pain Score  07/29/2016  128/75 80 65 241.6 40.2  3/10     PHYSICAL EXAM General Level of Distress: no acute distress Overall Appearance: normal  Head and Face  Right Left  Fundoscopic Exam:  normal normal    Cardiovascular Cardiac: regular rate and rhythm without murmur  Right Left  Carotid Pulses: normal normal  Respiratory Lungs: clear to auscultation  Neurological Orientation: normal Recent and Remote Memory: normal Attention Span and Concentration:   normal Language: normal Fund of Knowledge: normal  Right Left Sensation: normal normal Upper Extremity Coordination: normal normal  Lower Extremity Coordination: normal normal  Musculoskeletal Gait and Station: normal  Right Left Upper Extremity Muscle Strength: normal normal Lower Extremity Muscle Strength: normal normal Upper Extremity Muscle Tone:  normal normal Lower Extremity Muscle Tone: normal normal   Motor Strength Upper and lower extremity motor strength  was tested in the clinically pertinent muscles. Any abnormal findings will be noted below.   Right Left Deltoid: 4-/5  Biceps: 4/5    Deep Tendon Reflexes  Right Left Biceps: normal normal Triceps: normal normal Brachioradialis: normal normal Patellar: normal normal Achilles: normal normal  Sensory Sensation was tested at C2 to T1. Any abnormal findings will be noted below.  Right Left C5: decreased    Cranial Nerves II. Optic Nerve/Visual Fields: normal III. Oculomotor: normal IV. Trochlear: normal V. Trigeminal: normal VI. Abducens: normal VII. Facial: normal VIII. Acoustic/Vestibular: normal IX. Glossopharyngeal: normal X. Vagus: normal XI. Spinal Accessory: normal XII. Hypoglossal: normal  Motor and other Tests Lhermittes: negative Rhomberg: negative Pronator drift: absent     Right Left Spurlings positive negative Hoffman's: normal normal Clonus: normal normal Babinski: normal normal SLR: negative negative Patrick's Corky Sox): negative negative Toe Walk: normal normal Toe Lift: normal normal Heel Walk: normal normal Tinels Elbow: negative negative Tinels Wrist: negative negative Phalen: negative negative   Additional Findings:   small lipoma in the right periscapular region which is  mildly tender.  Patient also has significant right   periscapular discomfort to palpation.  This appears to be more consistent with   a radiculopathy.    IMPRESSION   Cervical disc herniation C4-5 level with profound right arm weakness.  I recommended proceeding with anterior cervical  decompression and fusion at the C4-5 level.  Completed Orders (this encounter) Order Details Reason Side Interpretation Result Initial Treatment Date Region  Cervical Spine- AP/Lat/Obls/Odontoid/Flex/Ex W/ SWIMMERS     07/29/2016 All Levels to All Levels   Assessment/Plan # Detail Type Description   1. Assessment Cervicalgia (M54.2).       2. Assessment Cervical radiculopathy  (M54.12).       3. Assessment Herniated nucleus pulposus, cervical (M50.20).   Plan Orders Soft Collar Regular [phone]).       4. Assessment Essential (primary) hypertension (I10).       5. Assessment Body mass index (BMI) 40.0-44.9, adult (Z68.41).   Plan Orders Today's instructions / counseling include(s) Dietary management education, guidance, and counseling.           Pain Management Plan Pain Scale: 3/10. Method: Numeric Pain Intensity Scale. Location: neck and right side shoulder. Onset: 03/29/2016. Duration: varies. Quality: discomforting. Pain management follow-up plan of care: Patient uses ice therapy to help with the pain..    ACDF C4-5 in the near future.  Patient was fitted with a soft cervical  collar.     nurse education was performed with the patient  Orders: Diagnostic Procedures: Assessment Procedure  M54.12 Cervical Spine- AP/Lat/Obls/Odontoid/Flex/Ex  M54.12 Cervical Spine- Lateral  Instruction(s)/Education: Assessment Instruction  I10 Hypertension education  Z68.41 Dietary management education, guidance, and counseling  Miscellaneous: Assessment   M50.20 Soft Collar Regular ([id number])             Provider:  Vertell Limber MD, Marchia Meiers 08/03/2016 3:03 PM  Dictation edited by: Marchia Meiers. Vertell Limber    CC Providers: Sharilyn Sites [facility] [address],  [place]  [address]-   John Golding  [facility] [address] [place] [address]-              Electronically signed by Marchia Meiers. Vertell Limber MD on 08/03/2016 03:03 PM

## 2016-08-12 NOTE — Pre-Procedure Instructions (Signed)
Crystal Roberts  08/12/2016      [facility] [address], [address] [address] [address] [place] [address] Phone: [phone] Fax: [phone]  [place], [place] - [address] [facility]. [address] [place] [address] Phone: [phone] Fax: [phone]    Your procedure is scheduled on  August 16, 2016  Report to [place] at 1120 AM.  Call this number if you have problems the morning of surgery:  [phone]   Remember:  Do not eat food or drink liquids after midnight.  Take these medicines the morning of surgery with A SIP OF WATER bupropion (wellbutrin), allergy eye drops-if needed, levothyroxine (synthroid)  7 days prior to surgery STOP taking any meloxicam (mobic),  Aspirin, Aleve, Naproxen, Ibuprofen, Motrin, Advil, Goody's, BC's, all herbal medications, fish oil, and all vitamins     How to Manage Your Diabetes Before and After Surgery  Why is it important to control my blood sugar before and after surgery? . Improving blood sugar levels before and after surgery helps healing and can limit problems. . A way of improving blood sugar control is eating a healthy diet by: o  Eating less sugar and carbohydrates o  Increasing activity/exercise o  Talking with your doctor about reaching your blood sugar goals . High blood sugars (greater than 180 mg/dL) can raise your risk of infections and slow your recovery, so you will need to focus on controlling your diabetes during the weeks before surgery. . Make sure that the doctor who takes care of your diabetes knows about your planned surgery including the date and location.  How do I manage my blood sugar before surgery? . Check your blood sugar at least 4 times a day, starting 2 days before surgery, to make sure that the level is not too high or low. o Check your blood sugar the morning of your surgery when you wake up and every 2  hours until you get to the Short Stay unit. . If your blood sugar is less than 70 mg/dL, you will need to treat for low blood sugar: o Do not take insulin. o Treat a low blood sugar (less than 70 mg/dL) with  cup of clear juice (cranberry or apple), 4 glucose tablets, OR glucose gel. o Recheck blood sugar in 15 minutes after treatment (to make sure it is greater than 70 mg/dL). If your blood sugar is not greater than 70 mg/dL on recheck, call [phone] for further instructions. . Report your blood sugar to the short stay nurse when you get to Short Stay.  . If you are admitted to the hospital after surgery: o Your blood sugar will be checked by the staff and you will probably be given insulin after surgery (instead of oral diabetes medicines) to make sure you have good blood sugar levels. o The goal for blood sugar control after surgery is 80-180 mg/dL.       WHAT DO I DO ABOUT MY DIABETES MEDICATION?   Marland Kitchen Do not take oral diabetes medicines (pills) the morning of surgery.  . The day of surgery, do not take other diabetes injectables, including Byetta (exenatide), Bydureon (exenatide ER), Victoza (liraglutide), or Trulicity (dulaglutide).  . If your CBG is greater than 220 mg/dL, you may take  of your sliding scale (correction) dose of insulin.   Reviewed and Endorsed by [facility] Patient Education Committee, August 2015  Do  not wear jewelry, make-up or nail polish.  Do not wear lotions, powders, or perfumes, or deoderant.  Do not shave 48 hours prior to surgery.    Do not bring valuables to the hospital.  [facility] is not responsible for any belongings or valuables.  Contacts, dentures or bridgework may not be worn into surgery.  Leave your suitcase in the car.  After surgery it may be brought to your room.  For patients admitted to the hospital, discharge time will be determined by your treatment team.  Patients discharged the day of surgery will not be allowed to drive  home.    Special instructions:  [facility]- Preparing For Surgery  Before surgery, you can play an important role. Because skin is not sterile, your skin needs to be as free of germs as possible. You can reduce the number of germs on your skin by washing with CHG (chlorahexidine gluconate) Soap before surgery.  CHG is an antiseptic cleaner which kills germs and bonds with the skin to continue killing germs even after washing.  Please do not use if you have an allergy to CHG or antibacterial soaps. If your skin becomes reddened/irritated stop using the CHG.  Do not shave (including legs and underarms) for at least 48 hours prior to first CHG shower. It is OK to shave your face.  Please follow these instructions carefully.   1. Shower the NIGHT BEFORE SURGERY and the MORNING OF SURGERY with CHG.   2. If you chose to wash your hair, wash your hair first as usual with your normal shampoo.  3. After you shampoo, rinse your hair and body thoroughly to remove the shampoo.  4. Use CHG as you would any other liquid soap. You can apply CHG directly to the skin and wash gently with a scrungie or a clean washcloth.   5. Apply the CHG Soap to your body ONLY FROM THE NECK DOWN.  Do not use on open wounds or open sores. Avoid contact with your eyes, ears, mouth and genitals (private parts). Wash genitals (private parts) with your normal soap.  6. Wash thoroughly, paying special attention to the area where your surgery will be performed.  7. Thoroughly rinse your body with warm water from the neck down.  8. DO NOT shower/wash with your normal soap after using and rinsing off the CHG Soap.  9. Pat yourself dry with a CLEAN TOWEL.   10. Wear CLEAN PAJAMAS   11. Place CLEAN SHEETS on your bed the night of your first shower and DO NOT SLEEP WITH PETS.    Day of Surgery: Do not apply any deodorants/lotions. Please wear clean clothes to the hospital/surgery center.     Please read over the  following fact sheets that you were given. Pain Booklet, Coughing and Deep Breathing, MRSA Information and Surgical Site Infection Prevention

## 2016-08-13 ENCOUNTER — Encounter (HOSPITAL_COMMUNITY)
Admission: RE | Admit: 2016-08-13 | Discharge: 2016-08-13 | Disposition: A | Discharge status: Home / Self Care | Payer: 59 | Source: Ambulatory Visit | Attending: Neurosurgery | Admitting: Neurosurgery

## 2016-08-13 ENCOUNTER — Other Ambulatory Visit: Payer: Self-pay

## 2016-08-13 ENCOUNTER — Encounter (HOSPITAL_COMMUNITY): Payer: Self-pay

## 2016-08-13 DIAGNOSIS — Z01812 Encounter for preprocedural laboratory examination: Secondary | ICD-10-CM | POA: Insufficient documentation

## 2016-08-13 DIAGNOSIS — Z87891 Personal history of nicotine dependence: Secondary | ICD-10-CM | POA: Diagnosis not present

## 2016-08-13 DIAGNOSIS — E039 Hypothyroidism, unspecified: Secondary | ICD-10-CM | POA: Diagnosis not present

## 2016-08-13 DIAGNOSIS — Z882 Allergy status to sulfonamides status: Secondary | ICD-10-CM | POA: Diagnosis not present

## 2016-08-13 DIAGNOSIS — M50121 Cervical disc disorder at C4-C5 level with radiculopathy: Secondary | ICD-10-CM | POA: Diagnosis not present

## 2016-08-13 DIAGNOSIS — M5080 Other cervical disc disorders, unspecified cervical region: Secondary | ICD-10-CM | POA: Insufficient documentation

## 2016-08-13 DIAGNOSIS — Z79899 Other long term (current) drug therapy: Secondary | ICD-10-CM | POA: Diagnosis not present

## 2016-08-13 DIAGNOSIS — E119 Type 2 diabetes mellitus without complications: Secondary | ICD-10-CM | POA: Diagnosis not present

## 2016-08-13 DIAGNOSIS — Z6841 Body Mass Index (BMI) 40.0 and over, adult: Secondary | ICD-10-CM | POA: Diagnosis not present

## 2016-08-13 DIAGNOSIS — I10 Essential (primary) hypertension: Secondary | ICD-10-CM | POA: Diagnosis not present

## 2016-08-13 HISTORY — DX: Other pulmonary embolism without acute cor pulmonale: I26.99

## 2016-08-13 HISTORY — DX: Anxiety disorder, unspecified: F41.9

## 2016-08-13 HISTORY — DX: Unspecified osteoarthritis, unspecified site: M19.90

## 2016-08-13 HISTORY — DX: Malignant (primary) neoplasm, unspecified: C80.1

## 2016-08-13 HISTORY — DX: Hypothyroidism, unspecified: E03.9

## 2016-08-13 HISTORY — DX: Personal history of other medical treatment: Z92.89

## 2016-08-13 HISTORY — DX: Major depressive disorder, single episode, unspecified: F32.9

## 2016-08-13 HISTORY — DX: Depression, unspecified: F32.A

## 2016-08-13 LAB — CBC
HEMATOCRIT: 39.6 % (ref 36.0–46.0)
HEMOGLOBIN: 13 g/dL (ref 12.0–15.0)
MCH: 29.6 pg (ref 26.0–34.0)
MCHC: 32.8 g/dL (ref 30.0–36.0)
MCV: 90.2 fL (ref 78.0–100.0)
PLATELETS: 370 10*3/uL (ref 150–400)
RBC: 4.39 MIL/uL (ref 3.87–5.11)
RDW: 12.8 % (ref 11.5–15.5)
WBC: 7 10*3/uL (ref 4.0–10.5)

## 2016-08-13 LAB — SURGICAL PCR SCREEN
MRSA, PCR: NEGATIVE
Staphylococcus aureus: NEGATIVE

## 2016-08-13 LAB — BASIC METABOLIC PANEL
ANION GAP: 10 (ref 5–15)
BUN: 13 mg/dL (ref 6–20)
CHLORIDE: 102 mmol/L (ref 101–111)
CO2: 26 mmol/L (ref 22–32)
CREATININE: 0.69 mg/dL (ref 0.44–1.00)
Calcium: 9.8 mg/dL (ref 8.9–10.3)
GFR calc non Af Amer: >60 mL/min (ref >60)
Glucose, Bld: 87 mg/dL (ref 65–99)
POTASSIUM: 3.6 mmol/L (ref 3.5–5.1)
SODIUM: 138 mmol/L (ref 135–145)

## 2016-08-13 LAB — GLUCOSE, CAPILLARY: Glucose-Capillary: 81 mg/dL (ref 65–99)

## 2016-08-13 LAB — TYPE AND SCREEN
ABO/RH(D): A POS
Antibody Screen: NEGATIVE

## 2016-08-13 LAB — ABO/RH: ABO/RH(D): A POS

## 2016-08-13 NOTE — Progress Notes (Signed)
Pt. Denies all chest concerns. PCP- Eddie Candle. Pt. Reports that she had an exercise stress test many yrs. Ago, told that it was negative & that she was reacting to stress with some chest pressure.

## 2016-08-14 LAB — HEMOGLOBIN A1C
HEMOGLOBIN A1C: 5.8 % — AB (ref 4.8–5.6)
Mean Plasma Glucose: 120 mg/dL

## 2016-08-16 ENCOUNTER — Ambulatory Visit (HOSPITAL_COMMUNITY): Payer: 59

## 2016-08-16 ENCOUNTER — Inpatient Hospital Stay (HOSPITAL_COMMUNITY)
Admission: AD | Admit: 2016-08-16 | Discharge: 2016-08-17 | DRG: 472 | Disposition: A | Discharge status: Home / Self Care | Payer: 59 | Source: Ambulatory Visit | Attending: Neurosurgery | Admitting: Neurosurgery

## 2016-08-16 ENCOUNTER — Encounter (HOSPITAL_COMMUNITY): Payer: Self-pay | Admitting: Anesthesiology

## 2016-08-16 ENCOUNTER — Encounter (HOSPITAL_COMMUNITY)
Admission: AD | Disposition: A | Discharge status: Home / Self Care | Payer: Self-pay | Source: Ambulatory Visit | Attending: Neurosurgery

## 2016-08-16 ENCOUNTER — Ambulatory Visit (HOSPITAL_COMMUNITY): Payer: 59 | Admitting: Anesthesiology

## 2016-08-16 DIAGNOSIS — E039 Hypothyroidism, unspecified: Secondary | ICD-10-CM | POA: Diagnosis present

## 2016-08-16 DIAGNOSIS — E119 Type 2 diabetes mellitus without complications: Secondary | ICD-10-CM | POA: Diagnosis not present

## 2016-08-16 DIAGNOSIS — Z6841 Body Mass Index (BMI) 40.0 and over, adult: Secondary | ICD-10-CM | POA: Diagnosis not present

## 2016-08-16 DIAGNOSIS — Z419 Encounter for procedure for purposes other than remedying health state, unspecified: Secondary | ICD-10-CM

## 2016-08-16 DIAGNOSIS — I10 Essential (primary) hypertension: Secondary | ICD-10-CM | POA: Diagnosis present

## 2016-08-16 DIAGNOSIS — Z882 Allergy status to sulfonamides status: Secondary | ICD-10-CM | POA: Diagnosis not present

## 2016-08-16 DIAGNOSIS — M50121 Cervical disc disorder at C4-C5 level with radiculopathy: Secondary | ICD-10-CM | POA: Diagnosis not present

## 2016-08-16 DIAGNOSIS — Z87891 Personal history of nicotine dependence: Secondary | ICD-10-CM | POA: Diagnosis not present

## 2016-08-16 DIAGNOSIS — Z79899 Other long term (current) drug therapy: Secondary | ICD-10-CM | POA: Diagnosis not present

## 2016-08-16 DIAGNOSIS — Z7984 Long term (current) use of oral hypoglycemic drugs: Secondary | ICD-10-CM | POA: Diagnosis not present

## 2016-08-16 DIAGNOSIS — M4322 Fusion of spine, cervical region: Secondary | ICD-10-CM | POA: Diagnosis not present

## 2016-08-16 DIAGNOSIS — M50221 Other cervical disc displacement at C4-C5 level: Secondary | ICD-10-CM | POA: Diagnosis not present

## 2016-08-16 DIAGNOSIS — M4802 Spinal stenosis, cervical region: Secondary | ICD-10-CM | POA: Diagnosis not present

## 2016-08-16 DIAGNOSIS — M5020 Other cervical disc displacement, unspecified cervical region: Secondary | ICD-10-CM | POA: Diagnosis present

## 2016-08-16 HISTORY — PX: ANTERIOR CERVICAL DECOMP/DISCECTOMY FUSION: SHX1161

## 2016-08-16 LAB — GLUCOSE, CAPILLARY
GLUCOSE-CAPILLARY: 85 mg/dL (ref 65–99)
GLUCOSE-CAPILLARY: 82 mg/dL (ref 65–99)
GLUCOSE-CAPILLARY: 129 mg/dL — AB (ref 65–99)
Glucose-Capillary: 97 mg/dL (ref 65–99)
Glucose-Capillary: 176 mg/dL — ABNORMAL HIGH (ref 65–99)

## 2016-08-16 SURGERY — ANTERIOR CERVICAL DECOMPRESSION/DISCECTOMY FUSION 1 LEVEL
Anesthesia: General | Site: Spine Cervical

## 2016-08-16 MED ORDER — BUPROPION HCL ER (XL) 150 MG PO TB24
150.0000 mg | ORAL_TABLET | Freq: Every day | ORAL | Status: DC
  Administered 2016-08-17: 150 mg via ORAL
  Filled 2016-08-16: qty 1

## 2016-08-16 MED ORDER — SENNOSIDES-DOCUSATE SODIUM 8.6-50 MG PO TABS: 1.0000 | ORAL_TABLET | Freq: Every evening | ORAL | Status: DC | PRN

## 2016-08-16 MED ORDER — INSULIN ASPART 100 UNIT/ML ~~LOC~~ SOLN
6.0000 [IU] | Freq: Three times a day (TID) | SUBCUTANEOUS | Status: DC
  Administered 2016-08-17: 6 [IU] via SUBCUTANEOUS

## 2016-08-16 MED ORDER — CHLORHEXIDINE GLUCONATE CLOTH 2 % EX PADS: 6.0000 | MEDICATED_PAD | Freq: Once | CUTANEOUS | Status: DC

## 2016-08-16 MED ORDER — LACTATED RINGERS IV SOLN
INTRAVENOUS | Status: DC
  Administered 2016-08-16: 50 mL/h via INTRAVENOUS
  Administered 2016-08-16 (×2): via INTRAVENOUS

## 2016-08-16 MED ORDER — ADULT MULTIVITAMIN W/MINERALS CH
1.0000 | ORAL_TABLET | Freq: Every day | ORAL | Status: DC
  Administered 2016-08-17: 1 via ORAL
  Filled 2016-08-16 (×2): qty 1

## 2016-08-16 MED ORDER — HYDROCHLOROTHIAZIDE 25 MG PO TABS
25.0000 mg | ORAL_TABLET | Freq: Every day | ORAL | Status: DC
  Administered 2016-08-17: 25 mg via ORAL
  Filled 2016-08-16: qty 1

## 2016-08-16 MED ORDER — ONDANSETRON HCL 4 MG PO TABS: 4.0000 mg | ORAL_TABLET | Freq: Four times a day (QID) | ORAL | Status: DC | PRN

## 2016-08-16 MED ORDER — DEXAMETHASONE SODIUM PHOSPHATE 10 MG/ML IJ SOLN
INTRAMUSCULAR | Status: AC
  Filled 2016-08-16: qty 1

## 2016-08-16 MED ORDER — METHOCARBAMOL 1000 MG/10ML IJ SOLN
500.0000 mg | Freq: Four times a day (QID) | INTRAVENOUS | Status: DC | PRN
  Filled 2016-08-16: qty 5

## 2016-08-16 MED ORDER — BISACODYL 10 MG RE SUPP: 10.0000 mg | Freq: Every day | RECTAL | Status: DC | PRN

## 2016-08-16 MED ORDER — PANTOPRAZOLE SODIUM 40 MG IV SOLR
40.0000 mg | Freq: Every day | INTRAVENOUS | Status: DC
  Administered 2016-08-16: 40 mg via INTRAVENOUS
  Filled 2016-08-16: qty 40

## 2016-08-16 MED ORDER — LIDOCAINE HCL (CARDIAC) 20 MG/ML IV SOLN
INTRAVENOUS | Status: DC | PRN
  Administered 2016-08-16: 100 mg via INTRAVENOUS

## 2016-08-16 MED ORDER — MENTHOL 3 MG MT LOZG: 1.0000 | LOZENGE | OROMUCOSAL | Status: DC | PRN

## 2016-08-16 MED ORDER — LOSARTAN POTASSIUM 50 MG PO TABS
100.0000 mg | ORAL_TABLET | Freq: Every day | ORAL | Status: DC
  Administered 2016-08-17: 100 mg via ORAL
  Filled 2016-08-16: qty 2

## 2016-08-16 MED ORDER — PROPOFOL 10 MG/ML IV BOLUS
INTRAVENOUS | Status: DC | PRN
  Administered 2016-08-16: 120 mg via INTRAVENOUS

## 2016-08-16 MED ORDER — HYDROMORPHONE HCL 1 MG/ML IJ SOLN
INTRAMUSCULAR | Status: AC
  Filled 2016-08-16: qty 1

## 2016-08-16 MED ORDER — BUPIVACAINE HCL (PF) 0.5 % IJ SOLN
INTRAMUSCULAR | Status: DC | PRN
  Administered 2016-08-16: 3.5 mL

## 2016-08-16 MED ORDER — HYDROMORPHONE HCL 1 MG/ML IJ SOLN
0.2500 mg | INTRAMUSCULAR | Status: DC | PRN
  Administered 2016-08-16 (×2): 0.5 mg via INTRAVENOUS

## 2016-08-16 MED ORDER — LEVOTHYROXINE SODIUM 75 MCG PO TABS
75.0000 ug | ORAL_TABLET | Freq: Every day | ORAL | Status: DC
  Administered 2016-08-17: 75 ug via ORAL
  Filled 2016-08-16: qty 1

## 2016-08-16 MED ORDER — ROCURONIUM BROMIDE 100 MG/10ML IV SOLN
INTRAVENOUS | Status: DC | PRN
  Administered 2016-08-16: 50 mg via INTRAVENOUS

## 2016-08-16 MED ORDER — HEMOSTATIC AGENTS (NO CHARGE) OPTIME
TOPICAL | Status: DC | PRN
  Administered 2016-08-16: 1 via TOPICAL

## 2016-08-16 MED ORDER — ONDANSETRON HCL 4 MG/2ML IJ SOLN
INTRAMUSCULAR | Status: DC | PRN
  Administered 2016-08-16: 4 mg via INTRAVENOUS

## 2016-08-16 MED ORDER — SCOPOLAMINE 1 MG/3DAYS TD PT72
MEDICATED_PATCH | TRANSDERMAL | Status: DC | PRN
  Administered 2016-08-16: 1 via TRANSDERMAL

## 2016-08-16 MED ORDER — 0.9 % SODIUM CHLORIDE (POUR BTL) OPTIME
TOPICAL | Status: DC | PRN
  Administered 2016-08-16: 1000 mL

## 2016-08-16 MED ORDER — SCOPOLAMINE 1 MG/3DAYS TD PT72
MEDICATED_PATCH | TRANSDERMAL | Status: AC
  Filled 2016-08-16: qty 1

## 2016-08-16 MED ORDER — THROMBIN 5000 UNITS EX SOLR
CUTANEOUS | Status: DC | PRN
  Administered 2016-08-16 (×2): 5000 [IU] via TOPICAL

## 2016-08-16 MED ORDER — SODIUM CHLORIDE 0.9% FLUSH: 3.0000 mL | INTRAVENOUS | Status: DC | PRN

## 2016-08-16 MED ORDER — SIMVASTATIN 20 MG PO TABS
40.0000 mg | ORAL_TABLET | Freq: Every day | ORAL | Status: DC
  Administered 2016-08-16: 40 mg via ORAL
  Filled 2016-08-16: qty 2

## 2016-08-16 MED ORDER — ZOLPIDEM TARTRATE 5 MG PO TABS: 5.0000 mg | ORAL_TABLET | Freq: Every evening | ORAL | Status: DC | PRN

## 2016-08-16 MED ORDER — LIDOCAINE 2% (20 MG/ML) 5 ML SYRINGE
INTRAMUSCULAR | Status: AC
  Filled 2016-08-16: qty 5

## 2016-08-16 MED ORDER — PHENYLEPHRINE 40 MCG/ML (10ML) SYRINGE FOR IV PUSH (FOR BLOOD PRESSURE SUPPORT)
PREFILLED_SYRINGE | INTRAVENOUS | Status: AC
  Filled 2016-08-16: qty 10

## 2016-08-16 MED ORDER — KCL IN DEXTROSE-NACL 20-5-0.45 MEQ/L-%-% IV SOLN: INTRAVENOUS | Status: DC

## 2016-08-16 MED ORDER — HYDROCODONE-ACETAMINOPHEN 7.5-325 MG PO TABS: 1.0000 | ORAL_TABLET | Freq: Once | ORAL | Status: DC | PRN

## 2016-08-16 MED ORDER — METHOCARBAMOL 500 MG PO TABS
500.0000 mg | ORAL_TABLET | Freq: Four times a day (QID) | ORAL | Status: DC | PRN
  Administered 2016-08-17: 500 mg via ORAL
  Filled 2016-08-16: qty 1

## 2016-08-16 MED ORDER — CINNAMON 500 MG PO CAPS: ORAL_CAPSULE | Freq: Every day | ORAL | Status: DC

## 2016-08-16 MED ORDER — ACETAMINOPHEN 650 MG RE SUPP: 650.0000 mg | RECTAL | Status: DC | PRN

## 2016-08-16 MED ORDER — ROCURONIUM BROMIDE 10 MG/ML (PF) SYRINGE
PREFILLED_SYRINGE | INTRAVENOUS | Status: AC
  Filled 2016-08-16: qty 5

## 2016-08-16 MED ORDER — ASPIRIN EC 81 MG PO TBEC
81.0000 mg | DELAYED_RELEASE_TABLET | Freq: Every day | ORAL | Status: DC
  Administered 2016-08-16 – 2016-08-17 (×2): 81 mg via ORAL
  Filled 2016-08-16 (×2): qty 1

## 2016-08-16 MED ORDER — DEXAMETHASONE SODIUM PHOSPHATE 10 MG/ML IJ SOLN
INTRAMUSCULAR | Status: DC | PRN
  Administered 2016-08-16: 10 mg via INTRAVENOUS

## 2016-08-16 MED ORDER — ACETAMINOPHEN 325 MG PO TABS: 650.0000 mg | ORAL_TABLET | ORAL | Status: DC | PRN

## 2016-08-16 MED ORDER — CEFAZOLIN SODIUM-DEXTROSE 2-4 GM/100ML-% IV SOLN
2.0000 g | Freq: Three times a day (TID) | INTRAVENOUS | Status: AC
  Administered 2016-08-16 – 2016-08-17 (×2): 2 g via INTRAVENOUS
  Filled 2016-08-16: qty 100

## 2016-08-16 MED ORDER — CYCLOBENZAPRINE HCL 10 MG PO TABS
10.0000 mg | ORAL_TABLET | Freq: Every day | ORAL | Status: DC
  Administered 2016-08-16: 10 mg via ORAL
  Filled 2016-08-16: qty 1

## 2016-08-16 MED ORDER — ONDANSETRON HCL 4 MG/2ML IJ SOLN
INTRAMUSCULAR | Status: AC
  Filled 2016-08-16: qty 2

## 2016-08-16 MED ORDER — SODIUM CHLORIDE 0.9% FLUSH
3.0000 mL | Freq: Two times a day (BID) | INTRAVENOUS | Status: DC
  Administered 2016-08-16: 3 mL via INTRAVENOUS

## 2016-08-16 MED ORDER — MIDAZOLAM HCL 5 MG/5ML IJ SOLN
INTRAMUSCULAR | Status: DC | PRN
  Administered 2016-08-16: 2 mg via INTRAVENOUS

## 2016-08-16 MED ORDER — PROPOFOL 10 MG/ML IV BOLUS
INTRAVENOUS | Status: AC
  Filled 2016-08-16: qty 20

## 2016-08-16 MED ORDER — LIDOCAINE-EPINEPHRINE 1 %-1:100000 IJ SOLN
INTRAMUSCULAR | Status: AC
  Filled 2016-08-16: qty 1

## 2016-08-16 MED ORDER — MIDAZOLAM HCL 2 MG/2ML IJ SOLN
INTRAMUSCULAR | Status: AC
  Filled 2016-08-16: qty 2

## 2016-08-16 MED ORDER — METFORMIN HCL 500 MG PO TABS
750.0000 mg | ORAL_TABLET | Freq: Two times a day (BID) | ORAL | Status: DC
  Administered 2016-08-17: 750 mg via ORAL
  Filled 2016-08-16: qty 2

## 2016-08-16 MED ORDER — METOCLOPRAMIDE HCL 5 MG/ML IJ SOLN
10.0000 mg | Freq: Once | INTRAMUSCULAR | Status: DC | PRN
Start: 2016-08-16 — End: 2016-08-16

## 2016-08-16 MED ORDER — THROMBIN 5000 UNITS EX SOLR
OROMUCOSAL | Status: DC | PRN
  Administered 2016-08-16: 17:00:00 via TOPICAL

## 2016-08-16 MED ORDER — ALUM & MAG HYDROXIDE-SIMETH 200-200-20 MG/5ML PO SUSP: 30.0000 mL | Freq: Four times a day (QID) | ORAL | Status: DC | PRN

## 2016-08-16 MED ORDER — FENTANYL CITRATE (PF) 250 MCG/5ML IJ SOLN
INTRAMUSCULAR | Status: AC
  Filled 2016-08-16: qty 5

## 2016-08-16 MED ORDER — CEFAZOLIN SODIUM-DEXTROSE 2-4 GM/100ML-% IV SOLN
2.0000 g | INTRAVENOUS | Status: AC
  Administered 2016-08-16: 2 g via INTRAVENOUS
  Filled 2016-08-16: qty 100

## 2016-08-16 MED ORDER — FLEET ENEMA 7-19 GM/118ML RE ENEM: 1.0000 | ENEMA | Freq: Once | RECTAL | Status: DC | PRN

## 2016-08-16 MED ORDER — LIDOCAINE-EPINEPHRINE 1 %-1:100000 IJ SOLN
INTRAMUSCULAR | Status: DC | PRN
  Administered 2016-08-16: 3.5 mL

## 2016-08-16 MED ORDER — OXYCODONE HCL 5 MG PO TABS
5.0000 mg | ORAL_TABLET | ORAL | Status: DC | PRN
  Administered 2016-08-16 – 2016-08-17 (×4): 10 mg via ORAL
  Filled 2016-08-16 (×4): qty 2

## 2016-08-16 MED ORDER — INSULIN ASPART 100 UNIT/ML ~~LOC~~ SOLN
0.0000 [IU] | Freq: Three times a day (TID) | SUBCUTANEOUS | Status: DC
  Administered 2016-08-17: 3 [IU] via SUBCUTANEOUS

## 2016-08-16 MED ORDER — THROMBIN 5000 UNITS EX SOLR
CUTANEOUS | Status: AC
  Filled 2016-08-16: qty 15000

## 2016-08-16 MED ORDER — KETOTIFEN FUMARATE 0.025 % OP SOLN
1.0000 [drp] | Freq: Every day | OPHTHALMIC | Status: DC | PRN
  Filled 2016-08-16: qty 5

## 2016-08-16 MED ORDER — NEOSTIGMINE METHYLSULFATE 5 MG/5ML IV SOSY
PREFILLED_SYRINGE | INTRAVENOUS | Status: AC
  Filled 2016-08-16: qty 5

## 2016-08-16 MED ORDER — PHENYLEPHRINE HCL 10 MG/ML IJ SOLN
INTRAMUSCULAR | Status: DC | PRN
  Administered 2016-08-16 (×2): 80 ug via INTRAVENOUS

## 2016-08-16 MED ORDER — FENTANYL CITRATE (PF) 100 MCG/2ML IJ SOLN
INTRAMUSCULAR | Status: DC | PRN
  Administered 2016-08-16: 100 ug via INTRAVENOUS
  Administered 2016-08-16: 50 ug via INTRAVENOUS
  Administered 2016-08-16: 100 ug via INTRAVENOUS

## 2016-08-16 MED ORDER — BUPIVACAINE HCL (PF) 0.5 % IJ SOLN
INTRAMUSCULAR | Status: AC
  Filled 2016-08-16: qty 30

## 2016-08-16 MED ORDER — INSULIN ASPART 100 UNIT/ML ~~LOC~~ SOLN: 0.0000 [IU] | Freq: Every day | SUBCUTANEOUS | Status: DC

## 2016-08-16 MED ORDER — NEOSTIGMINE METHYLSULFATE 10 MG/10ML IV SOLN
INTRAVENOUS | Status: DC | PRN
  Administered 2016-08-16: 3 mg via INTRAVENOUS

## 2016-08-16 MED ORDER — GLYCOPYRROLATE 0.2 MG/ML IJ SOLN
INTRAMUSCULAR | Status: DC | PRN
  Administered 2016-08-16: 0.4 mg via INTRAVENOUS

## 2016-08-16 MED ORDER — DOCUSATE SODIUM 100 MG PO CAPS
100.0000 mg | ORAL_CAPSULE | Freq: Two times a day (BID) | ORAL | Status: DC
  Administered 2016-08-16 – 2016-08-17 (×2): 100 mg via ORAL
  Filled 2016-08-16 (×2): qty 1

## 2016-08-16 MED ORDER — PHENOL 1.4 % MT LIQD: 1.0000 | OROMUCOSAL | Status: DC | PRN

## 2016-08-16 MED ORDER — MORPHINE SULFATE (PF) 4 MG/ML IV SOLN: 2.0000 mg | INTRAVENOUS | Status: DC | PRN

## 2016-08-16 MED ORDER — ONDANSETRON HCL 4 MG/2ML IJ SOLN: 4.0000 mg | Freq: Four times a day (QID) | INTRAMUSCULAR | Status: DC | PRN

## 2016-08-16 MED ORDER — LOSARTAN POTASSIUM-HCTZ 100-25 MG PO TABS: 1.0000 | ORAL_TABLET | Freq: Every day | ORAL | Status: DC

## 2016-08-16 MED ORDER — MEPERIDINE HCL 25 MG/ML IJ SOLN: 6.2500 mg | INTRAMUSCULAR | Status: DC | PRN

## 2016-08-16 SURGICAL SUPPLY — 63 items
ADH SKN CLS APL DERMABOND .7 (GAUZE/BANDAGES/DRESSINGS) ×1
BASKET BONE COLLECTION (BASKET) ×2 IMPLANT
BIT DRILL 12X2.5XAVTR (BIT) ×1 IMPLANT
BIT DRILL AVIATOR 12 (BIT) ×2
BIT DRILL NEURO 2X3.1 SFT TUCH (MISCELLANEOUS) ×1 IMPLANT
BIT DRL 12X2.5XAVTR (BIT) ×1
BNDG GAUZE ELAST 4 BULKY (GAUZE/BANDAGES/DRESSINGS) IMPLANT
BUR BARREL STRAIGHT FLUTE 4.0 (BURR) ×2 IMPLANT
CANISTER SUCT 3000ML PPV (MISCELLANEOUS) ×2 IMPLANT
CARTRIDGE OIL MAESTRO DRILL (MISCELLANEOUS) ×1 IMPLANT
COVER MAYO STAND STRL (DRAPES) ×2 IMPLANT
DERMABOND ADVANCED (GAUZE/BANDAGES/DRESSINGS) ×1
DERMABOND ADVANCED .7 DNX12 (GAUZE/BANDAGES/DRESSINGS) ×1 IMPLANT
DIFFUSER DRILL AIR PNEUMATIC (MISCELLANEOUS) ×2 IMPLANT
DRAPE HALF SHEET 40X57 (DRAPES) ×1 IMPLANT
DRAPE LAPAROTOMY 100X72 PEDS (DRAPES) ×2 IMPLANT
DRAPE MICROSCOPE LEICA (MISCELLANEOUS) ×2 IMPLANT
DRAPE POUCH INSTRU U-SHP 10X18 (DRAPES) ×2 IMPLANT
DRILL NEURO 2X3.1 SOFT TOUCH (MISCELLANEOUS) ×2
DRSG OPSITE POSTOP 3X4 (GAUZE/BANDAGES/DRESSINGS) ×2 IMPLANT
DURAPREP 6ML APPLICATOR 50/CS (WOUND CARE) ×2 IMPLANT
ELECT COATED BLADE 2.86 ST (ELECTRODE) ×2 IMPLANT
ELECT REM PT RETURN 9FT ADLT (ELECTROSURGICAL) ×2
ELECTRODE REM PT RTRN 9FT ADLT (ELECTROSURGICAL) ×1 IMPLANT
GAUZE SPONGE 4X4 12PLY STRL (GAUZE/BANDAGES/DRESSINGS) IMPLANT
GAUZE SPONGE 4X4 16PLY XRAY LF (GAUZE/BANDAGES/DRESSINGS) IMPLANT
GLOVE BIO SURGEON STRL SZ8 (GLOVE) ×4 IMPLANT
GLOVE BIOGEL PI IND STRL 8 (GLOVE) ×1 IMPLANT
GLOVE BIOGEL PI IND STRL 8.5 (GLOVE) ×2 IMPLANT
GLOVE BIOGEL PI INDICATOR 8 (GLOVE) ×2
GLOVE BIOGEL PI INDICATOR 8.5 (GLOVE) ×2
GLOVE ECLIPSE 8.0 STRL XLNG CF (GLOVE) ×3 IMPLANT
GOWN STRL REUS W/ TWL LRG LVL3 (GOWN DISPOSABLE) IMPLANT
GOWN STRL REUS W/ TWL XL LVL3 (GOWN DISPOSABLE) IMPLANT
GOWN STRL REUS W/TWL 2XL LVL3 (GOWN DISPOSABLE) ×4 IMPLANT
GOWN STRL REUS W/TWL LRG LVL3 (GOWN DISPOSABLE)
GOWN STRL REUS W/TWL XL LVL3 (GOWN DISPOSABLE) ×4
HALTER HD/CHIN CERV TRACTION D (MISCELLANEOUS) ×2 IMPLANT
HEMOSTAT POWDER KIT SURGIFOAM (HEMOSTASIS) ×1 IMPLANT
KIT BASIN OR (CUSTOM PROCEDURE TRAY) ×2 IMPLANT
KIT ROOM TURNOVER OR (KITS) ×2 IMPLANT
NDL HYPO 25X1 1.5 SAFETY (NEEDLE) ×1 IMPLANT
NDL SPNL 22GX3.5 QUINCKE BK (NEEDLE) ×1 IMPLANT
NEEDLE HYPO 25X1 1.5 SAFETY (NEEDLE) ×2 IMPLANT
NEEDLE SPNL 22GX3.5 QUINCKE BK (NEEDLE) ×2 IMPLANT
NS IRRIG 1000ML POUR BTL (IV SOLUTION) ×2 IMPLANT
OIL CARTRIDGE MAESTRO DRILL (MISCELLANEOUS) ×2
PACK LAMINECTOMY NEURO (CUSTOM PROCEDURE TRAY) ×2 IMPLANT
PAD ARMBOARD 7.5X6 YLW CONV (MISCELLANEOUS) ×6 IMPLANT
PEEK SPACER AVS AS 7X14X16X4% (Peek) ×1 IMPLANT
PIN DISTRACTION 14MM (PIN) ×4 IMPLANT
PLATE AVIATOR ASSY 1LVL SZ 14 (Plate) ×1 IMPLANT
PUTTY DBX 1CC (Putty) ×2 IMPLANT
PUTTY DBX 1CC DEPUY (Putty) ×1 IMPLANT
RUBBERBAND STERILE (MISCELLANEOUS) ×4 IMPLANT
SCREW AVIATOR VAR SELFTAP 4X14 (Screw) ×8 IMPLANT
SPONGE INTESTINAL PEANUT (DISPOSABLE) ×2 IMPLANT
SPONGE SURGIFOAM ABS GEL SZ50 (HEMOSTASIS) ×2 IMPLANT
SUT VIC AB 3-0 SH 8-18 (SUTURE) ×4 IMPLANT
SYR 3ML LL SCALE MARK (SYRINGE) IMPLANT
TOWEL GREEN STERILE (TOWEL DISPOSABLE) ×2 IMPLANT
TOWEL GREEN STERILE FF (TOWEL DISPOSABLE) ×2 IMPLANT
WATER STERILE IRR 1000ML POUR (IV SOLUTION) ×2 IMPLANT

## 2016-08-16 NOTE — Brief Op Note (Signed)
08/16/2016  5:52 PM  PATIENT:  Crystal Roberts  61 y.o. female  PRE-OPERATIVE DIAGNOSIS:  Cervical herniated disc C 45 with radiculopathy, stenosis, cervicalgia  POST-OPERATIVE DIAGNOSIS:  Cervical herniated disc C 45 with radiculopathy, stenosis, cervicalgia  PROCEDURE:  Procedure(s) with comments: Cervical Four-Five Anterior cervical decompression/discectomy/fusion (N/A) - C4-5 Anterior cervical decompression/discectomy/fusion with PEEK cage, autograft,, DBM, plate  SURGEON:  Surgeon(s) and Role:    Erline Levine, MD - Primary    * Newman Pies, MD - Assisting  PHYSICIAN ASSISTANT:   ASSISTANTS: Poteat, RN   ANESTHESIA:   general  EBL:  Total I/O In: 1000 [I.V.:1000] Out: 120 [Blood:120]  BLOOD ADMINISTERED:none  DRAINS: none   LOCAL MEDICATIONS USED:  LIDOCAINE   SPECIMEN:  No Specimen  DISPOSITION OF SPECIMEN:  N/A  COUNTS:  YES  TOURNIQUET:  * No tourniquets in log *  DICTATION: Patient is 61 year old female with herniated cervical disc C 45 with stenosis,radiculopathy.  It was elected to take her to surgery for anterior cervical decompression and fusion C 45 level.  PROCEDURE: Patient was brought to operating room and following the smooth and uncomplicated induction of general endotracheal anesthesia her head was placed on a horseshoe head holder she was placed in 5 pounds of Holter traction and her anterior neck was prepped and draped in usual sterile fashion. An incision was made on the left side of midline after infiltrating the skin and subcutaneous tissues with local lidocaine. The platysmal layer was incised and subplatysmal dissection was performed exposing the anterior border sternocleidomastoid muscle. Using blunt dissection the carotid sheath was kept lateral and trachea and esophagus kept medial exposing the anterior cervical spine. A bent spinal needle was placed it was felt to be the C 34 level and this was confirmed on intraoperative x-ray. Longus  coli muscles were taken down from the anterior cervical spine using electrocautery and key elevator and self-retaining retractor was placed exposing the C 45 level. The interspace was incised and a thorough discectomy was performed. Distraction pins were placed. Uncinate spurs and central spondylitic ridges were drilled down with a high-speed drill. The spinal cord dura and both C5 nerve roots were widely decompressed. A disc herniation was removed from the right side, with wide decompression of exiting right C 5 nerve root.  Hemostasis was assured. After trial sizing a 7 mm lordotic PEEK cage was selected and packed with local autograft along with DBM. This was tamped into position and countersunk appropriately. Distraction weight was removed. A 14 mm Aviator anterior cervical plate was affixed to the cervical spine with 14 mm variable-angle screws 2 at C4, 2 at C5. All screws were well-positioned and locking mechanisms were engaged. A final X ray was obtained which showed well positioned anterior plate at the uppermost aspect of the construct without complicating features. Soft tissues were inspected and found to be in good repair. The wound was irrigated. The platysma layer was closed with 3-0 Vicryl stitches and the skin was reapproximated with 3-0 Vicryl subcuticular stitches. The wound was dressed with Dermabond and an occlusive dressing. Counts were correct at the end of the case. Patient was extubated and taken to recovery in stable and satisfactory condition.   PLAN OF CARE: Admit to inpatient   PATIENT DISPOSITION:  PACU - hemodynamically stable.   Delay start of Pharmacological VTE agent (>24hrs) due to surgical blood loss or risk of bleeding: yes

## 2016-08-16 NOTE — Interval H&P Note (Signed)
History and Physical Interval Note:  08/16/2016 3:22 PM  Crystal Roberts  has presented today for surgery, with the diagnosis of Cervical herniated disc  The various methods of treatment have been discussed with the patient and family. After consideration of risks, benefits and other options for treatment, the patient has consented to  Procedure(s) with comments: C4-5 Anterior cervical decompression/discectomy/fusion (N/A) - C4-5 Anterior cervical decompression/discectomy/fusion as a surgical intervention .  The patient's history has been reviewed, patient examined, no change in status, stable for surgery.  I have reviewed the patient's chart and labs.  Questions were answered to the patient's satisfaction.     [name]

## 2016-08-16 NOTE — Anesthesia Preprocedure Evaluation (Signed)
Anesthesia Evaluation  Patient identified by MRN, date of birth, ID band Patient awake    Reviewed: Allergy & Precautions, NPO status , Patient's Chart, lab work & pertinent test results  Airway Mallampati: II  TM Distance: >3 FB Neck ROM: Full    Dental no notable dental hx. (+) Teeth Intact   Pulmonary former smoker,    Pulmonary exam normal breath sounds clear to auscultation       Cardiovascular hypertension, Pt. on medications Normal cardiovascular exam Rhythm:Regular Rate:Normal  Hx/o PTE   Neuro/Psych negative neurological ROS  negative psych ROS   GI/Hepatic negative GI ROS, Neg liver ROS,   Endo/Other  diabetes, Well Controlled, Type 2, Oral Hypoglycemic AgentsHypothyroidism Morbid obesityHyperlipidemia  Renal/GU negative Renal ROS  negative genitourinary   Musculoskeletal  (+) Arthritis , Herniated cervical disc- C4-5    Abdominal (+) + obese,   Peds  Hematology negative hematology ROS (+)   Anesthesia Other Findings   Reproductive/Obstetrics                             Anesthesia Physical Anesthesia Plan  ASA: III  Anesthesia Plan: General   Post-op Pain Management:    Induction: Intravenous  PONV Risk Score and Plan: 4 or greater and Ondansetron, Dexamethasone, Midazolam, Scopolamine patch - Pre-op and Propofol infusion  Airway Management Planned: Oral ETT  Additional Equipment:   Intra-op Plan:   Post-operative Plan: Extubation in OR  Informed Consent: I have reviewed the patients History and Physical, chart, labs and discussed the procedure including the risks, benefits and alternatives for the proposed anesthesia with the patient or authorized representative who has indicated his/her understanding and acceptance.   Dental advisory given  Plan Discussed with: CRNA, Anesthesiologist and Surgeon  Anesthesia Plan Comments:         Anesthesia Quick  Evaluation

## 2016-08-16 NOTE — Progress Notes (Signed)
Awake, alert, conversant.  Full strength bilateral Deltoids, biceps.  MAEW.  Doing well.

## 2016-08-16 NOTE — Op Note (Signed)
08/16/2016  5:52 PM  PATIENT:  Crystal Roberts  61 y.o. female  PRE-OPERATIVE DIAGNOSIS:  Cervical herniated disc C 45 with radiculopathy, stenosis, cervicalgia  POST-OPERATIVE DIAGNOSIS:  Cervical herniated disc C 45 with radiculopathy, stenosis, cervicalgia  PROCEDURE:  Procedure(s) with comments: Cervical Four-Five Anterior cervical decompression/discectomy/fusion (N/A) - C4-5 Anterior cervical decompression/discectomy/fusion with PEEK cage, autograft,, DBM, plate  SURGEON:  Surgeon(s) and Role:    Erline Levine, MD - Primary    * Newman Pies, MD - Assisting  PHYSICIAN ASSISTANT:   ASSISTANTS: Poteat, RN   ANESTHESIA:   general  EBL:  Total I/O In: 1000 [I.V.:1000] Out: 120 [Blood:120]  BLOOD ADMINISTERED:none  DRAINS: none   LOCAL MEDICATIONS USED:  LIDOCAINE   SPECIMEN:  No Specimen  DISPOSITION OF SPECIMEN:  N/A  COUNTS:  YES  TOURNIQUET:  * No tourniquets in log *  DICTATION: Patient is 61 year old female with herniated cervical disc C 45 with stenosis,radiculopathy.  It was elected to take her to surgery for anterior cervical decompression and fusion C 45 level.  PROCEDURE: Patient was brought to operating room and following the smooth and uncomplicated induction of general endotracheal anesthesia her head was placed on a horseshoe head holder she was placed in 5 pounds of Holter traction and her anterior neck was prepped and draped in usual sterile fashion. An incision was made on the left side of midline after infiltrating the skin and subcutaneous tissues with local lidocaine. The platysmal layer was incised and subplatysmal dissection was performed exposing the anterior border sternocleidomastoid muscle. Using blunt dissection the carotid sheath was kept lateral and trachea and esophagus kept medial exposing the anterior cervical spine. A bent spinal needle was placed it was felt to be the C 34 level and this was confirmed on intraoperative x-ray. Longus  coli muscles were taken down from the anterior cervical spine using electrocautery and key elevator and self-retaining retractor was placed exposing the C 45 level. The interspace was incised and a thorough discectomy was performed. Distraction pins were placed. Uncinate spurs and central spondylitic ridges were drilled down with a high-speed drill. The spinal cord dura and both C5 nerve roots were widely decompressed. A disc herniation was removed from the right side, with wide decompression of exiting right C 5 nerve root.  Hemostasis was assured. After trial sizing a 7 mm lordotic PEEK cage was selected and packed with local autograft along with DBM. This was tamped into position and countersunk appropriately. Distraction weight was removed. A 14 mm Aviator anterior cervical plate was affixed to the cervical spine with 14 mm variable-angle screws 2 at C4, 2 at C5. All screws were well-positioned and locking mechanisms were engaged. A final X ray was obtained which showed well positioned anterior plate at the uppermost aspect of the construct without complicating features. Soft tissues were inspected and found to be in good repair. The wound was irrigated. The platysma layer was closed with 3-0 Vicryl stitches and the skin was reapproximated with 3-0 Vicryl subcuticular stitches. The wound was dressed with Dermabond and an occlusive dressing. Counts were correct at the end of the case. Patient was extubated and taken to recovery in stable and satisfactory condition.   PLAN OF CARE: Admit to inpatient   PATIENT DISPOSITION:  PACU - hemodynamically stable.   Delay start of Pharmacological VTE agent (>24hrs) due to surgical blood loss or risk of bleeding: yes

## 2016-08-16 NOTE — Progress Notes (Signed)
PHARMACIST - PHYSICIAN ORDER COMMUNICATION  CONCERNING: P&T Medication Policy on Herbal Medications  DESCRIPTION:  This patient's order for:  Cinnamon  has been noted.  This product(s) is classified as an "herbal" or natural product. Due to a lack of definitive safety studies or FDA approval, nonstandard manufacturing practices, plus the potential risk of unknown drug-drug interactions while on inpatient medications, the Pharmacy and Therapeutics Committee does not permit the use of "herbal" or natural products of this type within [facility]   ACTION TAKEN: The pharmacy department is unable to verify this order at this time and your patient has been informed of this safety policy. Please reevaluate patient's clinical condition at discharge and address if the herbal or natural product(s) should be resumed at that time.   Lewie Chamber., PharmD Clinical Pharmacist [facility]

## 2016-08-16 NOTE — Anesthesia Postprocedure Evaluation (Signed)
Anesthesia Post Note  Patient: Crystal Roberts  Procedure(s) Performed: Procedure(s) (LRB): Cervical Four-Five Anterior cervical decompression/discectomy/fusion (N/A)     Patient location during evaluation: PACU Anesthesia Type: General Level of consciousness: sedated Pain management: pain level controlled Vital Signs Assessment: post-procedure vital signs reviewed and stable Respiratory status: spontaneous breathing and respiratory function stable Cardiovascular status: stable Anesthetic complications: no    Last Vitals:  Vitals:   08/16/16 1851 08/16/16 1908  BP:  140/67  Pulse:  79  Resp:  18  Temp: 36.9 C 36.6 C    Last Pain:  Vitals:   08/16/16 1908  TempSrc: Oral  PainSc: 3                  Josephyne Tarter DANIEL

## 2016-08-16 NOTE — Transfer of Care (Signed)
Immediate Anesthesia Transfer of Care Note  Patient: Crystal Roberts  Procedure(s) Performed: Procedure(s) with comments: Cervical Four-Five Anterior cervical decompression/discectomy/fusion (N/A) - C4-5 Anterior cervical decompression/discectomy/fusion  Patient Location: PACU  Anesthesia Type:General  Level of Consciousness: awake, alert , oriented and patient cooperative  Airway & Oxygen Therapy: Patient Spontanous Breathing and Patient connected to nasal cannula oxygen  Post-op Assessment: Report given to RN and Post -op Vital signs reviewed and stable  Post vital signs: Reviewed and stable  Last Vitals:  Vitals:   08/16/16 1107 08/16/16 1755  BP: (!) 184/84   Pulse: 63   Resp: 18   Temp:  37.1 C    Last Pain:  Vitals:   08/16/16 1107  TempSrc: Oral         Complications: No apparent anesthesia complications

## 2016-08-17 LAB — GLUCOSE, CAPILLARY: GLUCOSE-CAPILLARY: 146 mg/dL — AB (ref 65–99)

## 2016-08-17 MED ORDER — HYDROCODONE-ACETAMINOPHEN 5-325 MG PO TABS: 1.0000 | ORAL_TABLET | ORAL | 0 refills | Status: DC | PRN

## 2016-08-17 MED ORDER — CYCLOBENZAPRINE HCL 10 MG PO TABS: 10.0000 mg | ORAL_TABLET | Freq: Every day | ORAL | 0 refills | Status: DC

## 2016-08-17 NOTE — Progress Notes (Signed)
PT Note:    08/17/16 0800  PT Visit Information  Last PT Received On 08/17/16  Reason Eval/Treat Not Completed PT screened, no needs identified, will sign off   Pt worked with OT this AM and no PT needs identified. I met with pt and she states she has no mobility issues and no questions for PT. She feels ready for DC home. Ambulating independently without device. Justin, Albin 08/17/2016

## 2016-08-17 NOTE — Evaluation (Signed)
Occupational Therapy Evaluation Patient Details Name: Crystal Roberts MRN: [id number] DOB: [birth year] Today's Date: 08/17/2016    History of Present Illness 61 y.o. Female s/p C4-5 decompression/fusion.    Clinical Impression   PTA, pt was living with her husband and was independent. Currently, pt performing ADLs and functional mobility at [employer] level. Provided education on cervical precautions and adherence during ADLs; pt demonstrated good understanding. Recommend dc home once medically stable per physician. Provided all education and answered pt's questions in preparation for dc today. All acute OT needs met and will sign off. Thank you.     Follow Up Recommendations  DC plan and follow up therapy as arranged by surgeon;Supervision/Assistance - 24 hour    Equipment Recommendations  None recommended by OT    Recommendations for Other Services       Precautions / Restrictions Precautions Precautions: Back;Cervical Precaution Booklet Issued: Yes (comment) Precaution Comments: Reviewed neck and back precautions. Pt able to verbalize and demonstrate understanding during ADLs Required Braces or Orthoses: Cervical Brace Cervical Brace: Soft collar;At all times;Other (comment) (Off in showers) Restrictions Weight Bearing Restrictions: No      Mobility Bed Mobility Overal bed mobility: Needs Assistance Bed Mobility: Rolling;Sidelying to Sit Rolling: Min guard Sidelying to sit: Min guard       General bed mobility comments: Pt demonstrating good log roll technique with bed set up to simulate home (HOB and rails lowered).   Transfers Overall transfer level: Independent                    Balance Overall balance assessment: Independent                                         ADL either performed or assessed with clinical judgement   ADL Overall ADL's : Needs assistance/impaired Eating/Feeding: Set up;Sitting   Grooming: Set  up;Sitting Grooming Details (indicate cue type and reason): Provided education for grooming and hair care while adhering to cervical precautions Upper Body Bathing: Min guard;Sitting;Cueing for sequencing   Lower Body Bathing: Min guard;Sit to/from stand;Cueing for sequencing   Upper Body Dressing : Min guard;Sitting;Cueing for sequencing Upper Body Dressing Details (indicate cue type and reason): Pt donned shirt with Min VCs for sequencing task while adhering to cervical precautions Lower Body Dressing: Min guard;Sit to/from stand Lower Body Dressing Details (indicate cue type and reason): Pt donned pants with educational VCs for LB dressing techniques Toilet Transfer: Min guard;Ambulation (Simulated to recliner)     Toileting - Clothing Manipulation Details (indicate cue type and reason): Educated pt on toilet hygiene while adhering to precautions     Functional mobility during ADLs: Supervision/safety General ADL Comments: Pt performing ADLs and functional mobility at supervision-Min guard level. Educated pt on cerivcal/back precautions during ADLs. Pt demonstrating understanding. Feel pt will progress well with time.      Vision Baseline Vision/History: Wears glasses Wears Glasses: At all times Patient Visual Report: No change from baseline       Perception     Praxis      Pertinent Vitals/Pain Pain Assessment: Faces Faces Pain Scale: Hurts a little bit Pain Location: Neck/Back Pain Descriptors / Indicators: Constant;Grimacing Pain Intervention(s): Monitored during session     Hand Dominance Right   Extremity/Trunk Assessment Upper Extremity Assessment Upper Extremity Assessment: Overall WFL for tasks assessed   Lower Extremity Assessment Lower Extremity  Assessment: Overall WFL for tasks assessed   Cervical / Trunk Assessment Cervical / Trunk Assessment: Other exceptions (s/p)   Communication Communication Communication: No difficulties   Cognition  Arousal/Alertness: Awake/alert Behavior During Therapy: WFL for tasks assessed/performed Overall Cognitive Status: Within Functional Limits for tasks assessed                                     General Comments       Exercises     Shoulder Instructions      Home Living Family/patient expects to be discharged to:: Private residence Living Arrangements: Spouse/significant other Available Help at Discharge: Family (Sister and husband) Type of Home: House Home Access: Stairs to enter Technical brewer of Steps: 4 Entrance Stairs-Rails: Can reach both [place]: One level     Bathroom Shower/Tub: Occupational psychologist: [place]: [place];Shower seat - built in          Prior Functioning/Environment Level of Independence: Independent        Comments: ADLs, IADLs, and working        OT Problem List: Decreased range of motion;Decreased knowledge of use of DME or AE;Decreased knowledge of precautions;Pain      OT Treatment/Interventions:      OT Goals(Current goals can be found in the care plan section) Acute Rehab OT Goals Patient Stated Goal: Go home today OT Goal Formulation: With patient Time For Goal Achievement: 08/31/16 Potential to Achieve Goals: Good  OT Frequency:     Barriers to D/C:            Co-evaluation              AM-PAC PT "6 Clicks" Daily Activity     Outcome Measure Help from another person eating meals?: None Help from another person taking care of personal grooming?: A Little Help from another person toileting, which includes using toliet, bedpan, or urinal?: A Little Help from another person bathing (including washing, rinsing, drying)?: A Little Help from another person to put on and taking off regular upper body clothing?: A Little Help from another person to put on and taking off regular lower body clothing?: A Little 6 Click Score: 19   End of Session  Equipment Utilized During Treatment: Cervical collar Nurse Communication: Mobility status  Activity Tolerance: Patient tolerated treatment well Patient left: in chair;with call bell/phone within reach;with family/visitor present;with nursing/sitter in room  OT Visit Diagnosis: Pain;Other (comment) (Cervical precautions) Pain - Right/Left:  (Neck/back) Pain - part of body:  (Back.neck)                Time: 8115-7262 OT Time Calculation (min): 17 min Charges:  OT General Charges $OT Visit: 1 Procedure OT Evaluation $OT Eval Low Complexity: 1 Procedure G-Codes:     Toia Micale MSOT, OTR/L Acute Rehab Pager: [phone] Office: [place] 08/17/2016, 8:43 AM

## 2016-08-17 NOTE — Care Management Note (Signed)
Case Management Note  Patient Details  Name: Crystal Roberts MRN: [id number] Date of Birth: 06-28-1955  Subjective/Objective:                 Patient with order to DC to home today. Chart reviewed. No Home Health or Equipment needs, no unacknowledged Case Management consults or medication needs identified at the time of this note. Plan for DC to home. If needs arise today prior to discharge, please call Carles Collet RN CM at [phone].    Action/Plan:   Expected Discharge Date:  08/17/16               Expected Discharge Plan:  Home/Self Care  In-House Referral:     Discharge planning Services  CM Consult  Post Acute Care Choice:    Choice offered to:     DME Arranged:    DME Agency:     HH Arranged:    HH Agency:     Status of Service:  Completed, signed off  If discussed at [employer] of Stay Meetings, dates discussed:    Additional Comments:  Carles Collet, RN 08/17/2016, 9:05 AM

## 2016-08-17 NOTE — Progress Notes (Signed)
Patient is discharged from room 3C09 at this time. Alert and in stable condition. IV site d/c'd and instructions read to patient and husband with understanding verbalized. Left unit via wheelchair with all belongings at side.

## 2016-08-17 NOTE — Discharge Summary (Signed)
Physician Discharge Summary  Patient ID: Crystal Roberts MRN: [id number] DOB/AGE: 59-Feb-1957 59 y.o.  Admit date: 08/16/2016 Discharge date: 08/17/2016  Admission Diagnoses:  HNP Cervical  Discharge Diagnoses:  Same Active Problems:   Herniated cervical disc   Discharged Condition: Stable  Hospital Course:  Crystal Roberts is a 61 y.o. female who was admitted for the below procedure. There were no post operative complications. At time of discharge, pain was well controlled, ambulating with Pt/OT, tolerating po, voiding normal. Ready for discharge.  Treatments: Surgery -  Cervical Four-Five Anterior cervical decompression/discectomy/fusion (N/A) - C4-5 Anterior cervical decompression/discectomy/fusion with PEEK cage, autograft,, DBM, plate  Discharge Exam: Blood pressure (!) 146/67, pulse 79, temperature (!) 97.5 F (36.4 C), temperature source Oral, resp. rate 18, height 5\' 5"  (1.651 m), weight 109.8 kg (242 lb 1.6 oz), SpO2 93 %. Awake, alert, oriented Speech fluent, appropriate CN grossly intact 5/5 BUE/BLE Wound c/d/i  Disposition: Final discharge disposition not confirmed  Discharge Instructions    Call MD for:  difficulty breathing, headache or visual disturbances    Complete by:  As directed    Call MD for:  persistant dizziness or light-headedness    Complete by:  As directed    Call MD for:  redness, tenderness, or signs of infection (pain, swelling, redness, odor or green/yellow discharge around incision site)    Complete by:  As directed    Call MD for:  severe uncontrolled pain    Complete by:  As directed    Call MD for:  temperature >100.4    Complete by:  As directed    Diet general    Complete by:  As directed    Driving Restrictions    Complete by:  As directed    Do not drive until given clearance.   Increase activity slowly    Complete by:  As directed    Lifting restrictions    Complete by:  As directed    Do not lift anything >10lbs. Avoid  bending and twisting in awkward positions. Avoid bending at the back.   May shower / Bathe    Complete by:  As directed    In 24 hours. Okay to wash wound with warm soapy water. Avoid scrubbing the wound. Pat dry.   Remove dressing in 24 hours    Complete by:  As directed      Allergies as of 08/17/2016      Reactions   Sulfa Antibiotics Itching, Rash      Medication List    TAKE these medications   ALLERGY EYE DROPS OP Apply 1 drop to eye daily as needed (itching).   aspirin 81 MG tablet Take 81 mg by mouth daily.   buPROPion 150 MG 24 hr tablet Commonly known as:  WELLBUTRIN XL Take 150 mg by mouth daily before breakfast.   CINNAMON PO Take 1 tablet by mouth at bedtime.   cyclobenzaprine 10 MG tablet Commonly known as:  FLEXERIL Take 1 tablet (10 mg total) by mouth at bedtime.   HYDROcodone-acetaminophen 5-325 MG tablet Commonly known as:  NORCO/VICODIN Take 1-2 tablets by mouth every 4 (four) hours as needed for moderate pain.   ibuprofen 200 MG tablet Commonly known as:  ADVIL,MOTRIN Take 400 mg by mouth daily as needed for headache.   levothyroxine 75 MCG tablet Commonly known as:  SYNTHROID, LEVOTHROID Take 75 mcg by mouth daily before breakfast.   losartan-hydrochlorothiazide 100-25 MG tablet Commonly known as:  HYZAAR Take 1  tablet by mouth daily.   meloxicam 15 MG tablet Commonly known as:  MOBIC Take 15 mg by mouth daily.   metFORMIN 500 MG tablet Commonly known as:  GLUCOPHAGE Take 750 mg by mouth 2 (two) times daily with a meal.   multivitamin tablet Take 1 tablet by mouth daily.   naproxen sodium 220 MG tablet Commonly known as:  ANAPROX Take 440 mg by mouth daily as needed (headaches).   simvastatin 40 MG tablet Commonly known as:  ZOCOR Take 40 mg by mouth at bedtime.      Follow-up Information    Erline Levine, MD Follow up.   Specialty:  Neurosurgery Contact information: [address] [place]  [address] [phone]           Signed: Traci Sermon 08/17/2016, 8:56 AM

## 2016-08-19 ENCOUNTER — Encounter (HOSPITAL_COMMUNITY): Payer: Self-pay | Admitting: Neurosurgery

## 2016-08-26 MED FILL — LOSARTAN-HCTZ 100-25 MG TAB: 100-25 | 90 days supply | Qty: 90 | Fill #3

## 2016-08-26 MED FILL — metFORMIN HCL 500 MG TABS: 500 | 30 days supply | Qty: 120 | Fill #0

## 2016-08-26 MED FILL — buPROPion HCL ER (XL) 300 M: 300 | 90 days supply | Qty: 90 | Fill #1

## 2016-08-26 MED FILL — MELOXICAM 15 MG TABLET: 15 | 90 days supply | Qty: 90 | Fill #1

## 2016-09-18 DIAGNOSIS — Z6841 Body Mass Index (BMI) 40.0 and over, adult: Secondary | ICD-10-CM | POA: Diagnosis not present

## 2016-09-18 DIAGNOSIS — I10 Essential (primary) hypertension: Secondary | ICD-10-CM | POA: Diagnosis not present

## 2016-09-18 DIAGNOSIS — M5412 Radiculopathy, cervical region: Secondary | ICD-10-CM | POA: Diagnosis not present

## 2016-09-18 DIAGNOSIS — M542 Cervicalgia: Secondary | ICD-10-CM | POA: Diagnosis not present

## 2016-10-11 DIAGNOSIS — E119 Type 2 diabetes mellitus without complications: Secondary | ICD-10-CM | POA: Diagnosis not present

## 2016-10-11 DIAGNOSIS — Z1389 Encounter for screening for other disorder: Secondary | ICD-10-CM | POA: Diagnosis not present

## 2016-10-11 DIAGNOSIS — M5030 Other cervical disc degeneration, unspecified cervical region: Secondary | ICD-10-CM | POA: Diagnosis not present

## 2016-10-11 DIAGNOSIS — Z6841 Body Mass Index (BMI) 40.0 and over, adult: Secondary | ICD-10-CM | POA: Diagnosis not present

## 2016-10-11 DIAGNOSIS — E063 Autoimmune thyroiditis: Secondary | ICD-10-CM | POA: Diagnosis not present

## 2016-10-11 DIAGNOSIS — E782 Mixed hyperlipidemia: Secondary | ICD-10-CM | POA: Diagnosis not present

## 2016-10-22 MED FILL — metFORMIN HCL 500 MG TABS: 500 | 30 days supply | Qty: 120 | Fill #0

## 2016-10-29 DIAGNOSIS — I10 Essential (primary) hypertension: Secondary | ICD-10-CM | POA: Diagnosis not present

## 2016-10-29 DIAGNOSIS — M542 Cervicalgia: Secondary | ICD-10-CM | POA: Diagnosis not present

## 2016-10-29 DIAGNOSIS — Z6841 Body Mass Index (BMI) 40.0 and over, adult: Secondary | ICD-10-CM | POA: Diagnosis not present

## 2016-10-29 DIAGNOSIS — M5020 Other cervical disc displacement, unspecified cervical region: Secondary | ICD-10-CM | POA: Diagnosis not present

## 2016-10-29 DIAGNOSIS — M5412 Radiculopathy, cervical region: Secondary | ICD-10-CM | POA: Diagnosis not present

## 2016-11-05 MED FILL — SIMVASTATIN 40 MG TABLET: 40 | 90 days supply | Qty: 90 | Fill #2

## 2016-11-12 MED FILL — FREESTYLE LITE TEST STRIP: 50 days supply | Qty: 100 | Fill #2

## 2016-11-13 MED FILL — LEVOTHYROXINE 75 MCG TABLET: 75 | 90 days supply | Qty: 90 | Fill #0

## 2016-12-03 MED FILL — LOSARTAN-HCTZ 100-25 MG TAB: 100-25 | 90 days supply | Qty: 90 | Fill #0

## 2016-12-03 MED FILL — MELOXICAM 15 MG TABLET: 15 | 90 days supply | Qty: 90 | Fill #2

## 2016-12-03 MED FILL — BUPROPION HCL XL 300 MG TAB: 300 | 90 days supply | Qty: 90 | Fill #0

## 2016-12-03 MED FILL — metFORMIN HCL 500 MG TABS: 500 | 30 days supply | Qty: 120 | Fill #1

## 2017-01-10 MED FILL — metFORMIN HCL 500 MG TABS: 500 | 30 days supply | Qty: 120 | Fill #2

## 2017-01-30 MED FILL — SIMVASTATIN 40 MG TABLET: 40 | 90 days supply | Qty: 90 | Fill #3

## 2017-02-24 MED FILL — metFORMIN HCL 500 MG TABS: 500 | 30 days supply | Qty: 120 | Fill #3

## 2017-02-24 MED FILL — LOSARTAN-HCTZ 100-25 MG TAB: 100-25 | 90 days supply | Qty: 90 | Fill #1

## 2017-02-24 MED FILL — LEVOTHYROXINE 75 MCG TABLET: 75 | 90 days supply | Qty: 90 | Fill #1

## 2017-03-27 MED FILL — MELOXICAM 15 MG TABLET: 15 | 90 days supply | Qty: 90 | Fill #3

## 2017-03-27 MED FILL — BUPROPION HCL XL 300 MG TAB: 300 | 90 days supply | Qty: 90 | Fill #0

## 2017-04-21 ENCOUNTER — Other Ambulatory Visit (HOSPITAL_COMMUNITY): Payer: Self-pay | Admitting: Family Medicine

## 2017-04-21 DIAGNOSIS — Z1231 Encounter for screening mammogram for malignant neoplasm of breast: Secondary | ICD-10-CM

## 2017-04-24 ENCOUNTER — Ambulatory Visit (HOSPITAL_COMMUNITY)
Admission: RE | Admit: 2017-04-24 | Discharge: 2017-04-24 | Disposition: A | Discharge status: Home / Self Care | Payer: 59 | Source: Ambulatory Visit | Attending: Family Medicine | Admitting: Family Medicine

## 2017-04-24 ENCOUNTER — Encounter (HOSPITAL_COMMUNITY): Payer: Self-pay

## 2017-04-24 DIAGNOSIS — Z1231 Encounter for screening mammogram for malignant neoplasm of breast: Secondary | ICD-10-CM | POA: Insufficient documentation

## 2017-05-12 ENCOUNTER — Encounter: Payer: Self-pay | Admitting: Obstetrics & Gynecology

## 2017-05-13 DIAGNOSIS — Z1389 Encounter for screening for other disorder: Secondary | ICD-10-CM | POA: Diagnosis not present

## 2017-05-13 DIAGNOSIS — I10 Essential (primary) hypertension: Secondary | ICD-10-CM | POA: Diagnosis not present

## 2017-05-13 DIAGNOSIS — J069 Acute upper respiratory infection, unspecified: Secondary | ICD-10-CM | POA: Diagnosis not present

## 2017-05-13 DIAGNOSIS — E119 Type 2 diabetes mellitus without complications: Secondary | ICD-10-CM | POA: Diagnosis not present

## 2017-05-13 DIAGNOSIS — E782 Mixed hyperlipidemia: Secondary | ICD-10-CM | POA: Diagnosis not present

## 2017-05-13 DIAGNOSIS — Z6841 Body Mass Index (BMI) 40.0 and over, adult: Secondary | ICD-10-CM | POA: Diagnosis not present

## 2017-05-13 DIAGNOSIS — E063 Autoimmune thyroiditis: Secondary | ICD-10-CM | POA: Diagnosis not present

## 2017-05-13 MED FILL — metFORMIN HCL 500 MG TABS: 500 | 30 days supply | Qty: 120 | Fill #0

## 2017-05-13 MED FILL — AMOX-CLAV 875-125 MG TABLET: 875-125 | 10 days supply | Qty: 20 | Fill #0

## 2017-05-13 MED FILL — SIMVASTATIN 40 MG TABLET: 40 | 90 days supply | Qty: 90 | Fill #0

## 2017-05-26 MED FILL — LEVOTHYROXINE 75 MCG TABLET: 75 | 90 days supply | Qty: 90 | Fill #2

## 2017-05-26 MED FILL — LOSARTAN-HCTZ 100-25 MG TAB: 100-25 | 90 days supply | Qty: 90 | Fill #0

## 2017-05-26 MED FILL — FREESTYLE LITE TEST STRIP: 50 days supply | Qty: 100 | Fill #0

## 2017-05-30 ENCOUNTER — Other Ambulatory Visit: Payer: Self-pay | Admitting: Obstetrics & Gynecology

## 2017-06-02 ENCOUNTER — Telehealth: Payer: 59 | Admitting: Nurse Practitioner

## 2017-06-02 DIAGNOSIS — J029 Acute pharyngitis, unspecified: Secondary | ICD-10-CM | POA: Diagnosis not present

## 2017-06-02 MED ORDER — DOXYCYCLINE HYCLATE 100 MG PO TABS: 100.0000 mg | ORAL_TABLET | Freq: Two times a day (BID) | ORAL | 0 refills | Status: DC

## 2017-06-02 NOTE — Progress Notes (Signed)

## 2017-06-13 ENCOUNTER — Other Ambulatory Visit: Payer: Self-pay | Admitting: Obstetrics and Gynecology

## 2017-06-24 MED FILL — BUPROPION HCL XL 300 MG TAB: 300 | 90 days supply | Qty: 90 | Fill #1

## 2017-06-24 MED FILL — metFORMIN HCL 500 MG TABS: 500 | 30 days supply | Qty: 120 | Fill #1

## 2017-06-26 MED FILL — MELOXICAM 15 MG TABLET: 15 | 90 days supply | Qty: 90 | Fill #0

## 2017-07-04 ENCOUNTER — Other Ambulatory Visit: Payer: Self-pay | Admitting: Obstetrics and Gynecology

## 2017-07-11 ENCOUNTER — Other Ambulatory Visit (HOSPITAL_COMMUNITY)
Admission: RE | Admit: 2017-07-11 | Discharge: 2017-07-11 | Disposition: A | Discharge status: Home / Self Care | Payer: 59 | Source: Ambulatory Visit | Attending: Obstetrics & Gynecology | Admitting: Obstetrics & Gynecology

## 2017-07-11 ENCOUNTER — Encounter: Payer: Self-pay | Admitting: Obstetrics and Gynecology

## 2017-07-11 ENCOUNTER — Ambulatory Visit (INDEPENDENT_AMBULATORY_CARE_PROVIDER_SITE_OTHER): Payer: 59 | Admitting: Obstetrics and Gynecology

## 2017-07-11 VITALS — BP 139/76 | HR 69 | Ht 64.5 in | Wt 241.4 lb

## 2017-07-11 DIAGNOSIS — Z6841 Body Mass Index (BMI) 40.0 and over, adult: Secondary | ICD-10-CM

## 2017-07-11 DIAGNOSIS — Z01419 Encounter for gynecological examination (general) (routine) without abnormal findings: Secondary | ICD-10-CM | POA: Insufficient documentation

## 2017-07-11 DIAGNOSIS — Z01411 Encounter for gynecological examination (general) (routine) with abnormal findings: Secondary | ICD-10-CM | POA: Diagnosis not present

## 2017-07-11 DIAGNOSIS — E669 Obesity, unspecified: Secondary | ICD-10-CM | POA: Diagnosis not present

## 2017-07-11 NOTE — Progress Notes (Signed)
Patient ID: Crystal Roberts, female   DOB: 10-Dec-1955, 62 y.o.   MRN: [id number]   Assessment:  1. Annual Gyn Exam 2. Obesity Body mass index is 40.8 kg/m. Plan:  1. pap smear done, next pap due 3 years 2. return in 3 years or prn 3    Annual mammogram advised after age 54 Subjective:  Crystal Roberts is a 62 y.o. female G2P0020 who presents for annual exam. No LMP recorded. Patient is postmenopausal. The patient has no complaints today. Pt sees Dr. Armandina Gemma every 6 months. Her last A1c was 6.3. She is not very physically active, but she does mow her lawn. Her last PAP was on 01/20/2013 and was normal. Her last colpo was 10 years ago. She has no issues with her bowel movements. The patient denies fever, chills or any other symptoms or complaints at this time.    The following portions of the patient's history were reviewed and updated as appropriate: allergies, current medications, past family history, past medical history, past social history, past surgical history and problem list. Past Medical History:  Diagnosis Date  . Anxiety   . Arthritis    neck- cervical disc herniation   . Cancer ([place])    skin - low back , insitu  melanoma   . Depression   . Diabetes mellitus without complication ([place])   . H/O cardiovascular stress test 1990   pt. reports that she was having a lot of stress & she was seen by cardiac- had stress test & told that it was negative   . Hyperlipidemia   . Hypertension   . Hypothyroidism   . Pulmonary embolism ([place]) [place]. at [facility]- on blood thinning med., thought to be related to lack of activity, smoking & BCP    Past Surgical History:  Procedure Laterality Date  . ANTERIOR CERVICAL DECOMP/DISCECTOMY FUSION N/A 08/16/2016   Procedure: Cervical Four-Five Anterior cervical decompression/discectomy/fusion;  Surgeon: Erline Levine, MD;  Location: [facility];  Service: Neurosurgery;  Laterality: N/A;  C4-5 Anterior cervical decompression/discectomy/fusion  . BACK SURGERY      sebaceous cyst removed  . CHOLECYSTECTOMY  5852   umbilical hernia ------ followed cholecystectomy  . CYSTECTOMY Bilateral    cyst on ovaries  . HERNIA REPAIR     umbilical hernia repair   . TUBAL LIGATION       Current Outpatient Medications:  .  aspirin 81 MG tablet, Take 81 mg by mouth daily., Disp: , Rfl:  .  buPROPion (WELLBUTRIN XL) 150 MG 24 hr tablet, Take 150 mg by mouth daily before breakfast. , Disp: , Rfl:  .  CINNAMON PO, Take 1 tablet by mouth at bedtime., Disp: , Rfl:  .  cyclobenzaprine (FLEXERIL) 10 MG tablet, Take 1 tablet (10 mg total) by mouth at bedtime. (Patient taking differently: Take 10 mg by mouth as needed. ), Disp: 30 tablet, Rfl: 0 .  HYDROcodone-acetaminophen (NORCO/VICODIN) 5-325 MG tablet, Take 1-2 tablets by mouth every 4 (four) hours as needed for moderate pain., Disp: 60 tablet, Rfl: 0 .  ibuprofen (ADVIL,MOTRIN) 200 MG tablet, Take 400 mg by mouth daily as needed for headache., Disp: , Rfl:  .  Ketotifen Fumarate (ALLERGY EYE DROPS OP), Apply 1 drop to eye daily as needed (itching)., Disp: , Rfl:  .  levothyroxine (SYNTHROID, LEVOTHROID) 75 MCG tablet, Take 75 mcg by mouth daily before breakfast., Disp: , Rfl:  .  losartan-hydrochlorothiazide (HYZAAR) 100-25 MG per tablet, Take 1 tablet by mouth  daily., Disp: , Rfl:  .  meloxicam (MOBIC) 15 MG tablet, Take 15 mg by mouth daily., Disp: , Rfl:  .  metFORMIN (GLUCOPHAGE) 500 MG tablet, Take 750 mg by mouth 2 (two) times daily with a meal., Disp: , Rfl:  .  Multiple Vitamin (MULTIVITAMIN) tablet, Take 1 tablet by mouth daily., Disp: , Rfl:  .  naproxen sodium (ANAPROX) 220 MG tablet, Take 440 mg by mouth daily as needed (headaches)., Disp: , Rfl:  .  simvastatin (ZOCOR) 40 MG tablet, Take 40 mg by mouth at bedtime. , Disp: , Rfl:   Review of Systems Constitutional: negative Gastrointestinal: negative Genitourinary: negative  Objective:  BP 139/76 (BP Location: Right Arm, Patient Position: Sitting,  Cuff Size: Large)   Pulse 69   Ht 5' 4.5" (1.638 m)   Wt 241 lb 6.4 oz (109.5 kg)   BMI 40.80 kg/m    BMI: Body mass index is 40.8 kg/m.  General Appearance: Alert, appropriate appearance for age. No acute distress. Overweight. HEENT: Grossly normal Neck / Thyroid:  Cardiovascular: RRR; normal S1, S2, no murmur Lungs: CTA bilaterally Back: No CVAT Breast Exam: No dimpling, nipple retraction or discharge. No masses or nodes., Normal to inspection, Normal breast tissue bilaterally and No masses or nodes.No dimpling, nipple retraction or discharge. Gastrointestinal: Soft, non-tender, no masses or organomegaly Pelvic Exam: Vulva and vagina appear normal. Bimanual exam reveals normal uterus and adnexa. Rectovaginal: normal rectal, no masses and guaiac negative stool obtained Lymphatic Exam: Non-palpable nodes in neck, clavicular, axillary, or inguinal regions Skin: no rash or abnormalities Neurologic: Normal gait and speech, no tremor  Psychiatric: Alert and oriented, appropriate affect.  Urinalysis:Not done  By signing my name below, I, De Burrs, attest that this documentation has been prepared under the direction and in the presence of Jonnie Kind, MD Electronically Signed: De Burrs, Medical Scribe. 07/11/17. 8:53 AM.  I personally performed the services described in this documentation, which was SCRIBED in my presence. The recorded information has been reviewed and considered accurate. It has been edited as necessary during review. Jonnie Kind, MD

## 2017-07-15 LAB — CYTOLOGY - PAP: HPV (WINDOPATH): NOT DETECTED

## 2017-07-21 ENCOUNTER — Encounter: Payer: Self-pay | Admitting: Obstetrics and Gynecology

## 2017-08-04 MED FILL — metFORMIN HCL 500 MG TABS: 500 | 30 days supply | Qty: 120 | Fill #2

## 2017-08-05 MED FILL — SIMVASTATIN 40 MG TABLET: 40 | 90 days supply | Qty: 90 | Fill #1

## 2017-08-24 DIAGNOSIS — H524 Presbyopia: Secondary | ICD-10-CM | POA: Diagnosis not present

## 2017-08-27 MED FILL — LOSARTAN-HCTZ 100-25 MG TAB: 100-25 | 90 days supply | Qty: 90 | Fill #1

## 2017-08-28 ENCOUNTER — Encounter (INDEPENDENT_AMBULATORY_CARE_PROVIDER_SITE_OTHER): Payer: 59

## 2017-08-29 MED FILL — LEVOTHYROXINE 75 MCG TABLET: 75 | 90 days supply | Qty: 90 | Fill #0

## 2017-09-10 ENCOUNTER — Ambulatory Visit (INDEPENDENT_AMBULATORY_CARE_PROVIDER_SITE_OTHER): Payer: 59 | Admitting: Family Medicine

## 2017-09-10 ENCOUNTER — Encounter (INDEPENDENT_AMBULATORY_CARE_PROVIDER_SITE_OTHER): Payer: Self-pay | Admitting: Family Medicine

## 2017-09-10 VITALS — BP 134/83 | HR 58 | Temp 98.0°F | Ht 64.0 in | Wt 241.0 lb

## 2017-09-10 DIAGNOSIS — Z0289 Encounter for other administrative examinations: Secondary | ICD-10-CM

## 2017-09-10 DIAGNOSIS — Z6841 Body Mass Index (BMI) 40.0 and over, adult: Secondary | ICD-10-CM

## 2017-09-10 DIAGNOSIS — Z1331 Encounter for screening for depression: Secondary | ICD-10-CM | POA: Diagnosis not present

## 2017-09-10 DIAGNOSIS — E119 Type 2 diabetes mellitus without complications: Secondary | ICD-10-CM

## 2017-09-10 DIAGNOSIS — Z9189 Other specified personal risk factors, not elsewhere classified: Secondary | ICD-10-CM | POA: Diagnosis not present

## 2017-09-10 DIAGNOSIS — I10 Essential (primary) hypertension: Secondary | ICD-10-CM | POA: Diagnosis not present

## 2017-09-10 DIAGNOSIS — E7849 Other hyperlipidemia: Secondary | ICD-10-CM | POA: Diagnosis not present

## 2017-09-10 DIAGNOSIS — E038 Other specified hypothyroidism: Secondary | ICD-10-CM

## 2017-09-10 DIAGNOSIS — R0602 Shortness of breath: Secondary | ICD-10-CM

## 2017-09-10 DIAGNOSIS — R5383 Other fatigue: Secondary | ICD-10-CM

## 2017-09-10 DIAGNOSIS — E782 Mixed hyperlipidemia: Secondary | ICD-10-CM | POA: Diagnosis not present

## 2017-09-10 NOTE — Progress Notes (Signed)
.  Office: [phone]  /  Fax: [phone]   HPI:   Chief Complaint: OBESITY  Crystal Roberts (MR# [id number]) is a 62 y.o. female who presents on 09/10/2017 for obesity evaluation and treatment. Current BMI is Body mass index is 41.37 kg/m.Marland Kitchen Crystal Roberts has struggled with obesity for years and has been unsuccessful in either losing weight or maintaining long term weight loss. Crystal Roberts attended our information session and states she is currently in the action stage of change and ready to dedicate time achieving and maintaining a healthier weight.  Crystal Roberts states her family eats meals together she thinks her family will eat healthier with  her her desired weight loss is 84 to 94 lbs she has been heavy most of  her life she started gaining weight in the last couple of years her heaviest weight ever was 267 lbs. she is a picky eater and doesn't like to eat healthier foods  she has significant food cravings issues  she snacks frequently in the evenings she skips meals frequently she is frequently drinking liquids with calories she struggles with emotional eating    Fatigue Crystal Roberts feels her energy is lower than it should be. This has worsened with weight gain and has not worsened recently. Crystal Roberts admits to daytime somnolence and admits to waking up still tired. Patient is at risk for obstructive sleep apnea. Patent has a history of symptoms of daytime fatigue, morning fatigue and hypertension. Patient generally gets 7 hours of sleep per night, and states they generally have restless sleep. Snoring is present. Apneic episodes are not present. Epworth Sleepiness Score is 7  Dyspnea on exertion Crystal Roberts notes increasing shortness of breath with exercising and seems to be worsening over time with weight gain. She notes getting out of breath sooner with activity than she used to. This has not gotten worse recently. Kemara denies orthopnea.  Diabetes II Crystal Roberts has a diagnosis of diabetes type II. Nicki states her  fasting BGs ranged between 110 and 122 over the last week. There is no recent Hgb A1c in Epic. She is currently on metformin and denies any hypoglycemic episodes. She is attempting to work on intensive lifestyle modifications including diet, exercise, and weight loss to help control her blood glucose levels.  Hypertension Crystal Roberts is a 62 y.o. female with hypertension. Her blood pressure is stable on [place] denies chest pain, headache or dizziness. She is working weight loss to help control her blood pressure with the goal of decreasing her risk of heart attack and stroke. Crystal Roberts blood pressure is currently controlled.  Hyperlipidemia Leylah has hyperlipidemia and she would like to improve her cholesterol levels with intensive lifestyle modification including a low saturated fat diet, exercise and weight loss. Crystal Roberts is currently on simvastatin and she denies any chest pain or myalgias.  At risk for cardiovascular disease Crystal Roberts is at a higher than average risk for cardiovascular disease due to obesity, hypertension, hyperlipidemia and diabetes. She currently denies any chest pain.  Hypothyroid Crystal Roberts has a diagnosis of hypothyroidism. She is on levothyroxine. There are no recent labs in Epic. She denies hot or cold intolerance or palpitations, but does admit to ongoing fatigue.  Depression Screen Crystal Roberts's Food and Mood (modified PHQ-9) score was  Depression screen PHQ 2/9 09/10/2017  Decreased Interest 2  Down, Depressed, Hopeless 1  PHQ - 2 Score 3  Altered sleeping 1  Tired, decreased energy 2  Change in appetite 1  Feeling bad or failure about  yourself  1  Trouble concentrating 1  Moving slowly or fidgety/restless 0  Suicidal thoughts 0  PHQ-9 Score 9  Difficult doing work/chores Not difficult at all    ALLERGIES: Allergies  Allergen Reactions  . Sulfa Antibiotics Itching and Rash    MEDICATIONS: Current Outpatient Medications on File Prior to Visit    Medication Sig Dispense Refill  . aspirin 81 MG tablet Take 81 mg by mouth daily.    Marland Kitchen buPROPion (WELLBUTRIN XL) 150 MG 24 hr tablet Take 150 mg by mouth daily before breakfast.     . CINNAMON PO Take 1 tablet by mouth at bedtime.    Marland Kitchen levothyroxine (SYNTHROID, LEVOTHROID) 75 MCG tablet Take 75 mcg by mouth daily before breakfast.    . losartan-hydrochlorothiazide (HYZAAR) 100-25 MG per tablet Take 1 tablet by mouth daily.    . meloxicam (MOBIC) 15 MG tablet Take 15 mg by mouth daily.    . metFORMIN (GLUCOPHAGE-XR) 750 MG 24 hr tablet Take 750 mg by mouth 2 (two) times daily with a meal.    . Multiple Vitamin (MULTIVITAMIN) tablet Take 1 tablet by mouth daily.    . simvastatin (ZOCOR) 40 MG tablet Take 40 mg by mouth at bedtime.      No current facility-administered medications on file prior to visit.     PAST MEDICAL HISTORY: Past Medical History:  Diagnosis Date  . Anxiety   . Arthritis    neck- cervical disc herniation   . Asthma   . Back pain   . Cancer ([place])    skin - low back , insitu  melanoma   . Constipation   . Depression   . Diabetes mellitus without complication ([place])   . Dyspnea   . Gallbladder problem   . GERD (gastroesophageal reflux disease)   . H/O cardiovascular stress test 1990   pt. reports that she was having a lot of stress & she was seen by cardiac- had stress test & told that it was negative   . Hyperlipidemia   . Hypertension   . Hypothyroidism   . Leg edema   . Pulmonary embolism ([place]) [place]. at [facility]- on blood thinning med., thought to be related to lack of activity, smoking & BCP    PAST SURGICAL HISTORY: Past Surgical History:  Procedure Laterality Date  . ANTERIOR CERVICAL DECOMP/DISCECTOMY FUSION N/A 08/16/2016   Procedure: Cervical Four-Five Anterior cervical decompression/discectomy/fusion;  Surgeon: Erline Levine, MD;  Location: [facility];  Service: Neurosurgery;  Laterality: N/A;  C4-5 Anterior cervical decompression/discectomy/fusion  .  BACK SURGERY     sebaceous cyst removed  . CHOLECYSTECTOMY  0938   umbilical hernia ------ followed cholecystectomy  . CYSTECTOMY Bilateral    cyst on ovaries  . HERNIA REPAIR     umbilical hernia repair   . TUBAL LIGATION      SOCIAL HISTORY: Social History   Tobacco Use  . Smoking status: Former Smoker    Packs/day: 1.00    Years: 10.00    Pack years: 10.00    Types: Cigarettes    Last attempt to quit: 08/14/1986    Years since quitting: 31.0  . Smokeless tobacco: Never Used  Substance Use Topics  . Alcohol use: Yes    Comment: very rare   . Drug use: No    FAMILY HISTORY: Family History  Problem Relation Age of Onset  . Heart disease Mother   . Hypertension Mother   . Hyperlipidemia Mother   .  Cancer Father        melenoma  . Diabetes Sister   . Stroke Brother   . Heart disease Brother   . Diabetes Paternal Grandmother   . Diabetes Sister   . Cancer Sister        kidney; had kidney removed    ROS: Review of Systems  Constitutional: Positive for malaise/fatigue.  HENT:       Positive for Dentures  Eyes: Positive for redness.       + Wear Glasses or Contacts  Respiratory: Positive for shortness of breath.   Cardiovascular: Negative for chest pain and orthopnea.       Positive for Shortness of Breath with Activity Positive for Leg Cramping   Musculoskeletal: Negative for myalgias.  Skin:       Positive for Dryness  Neurological: Positive for headaches. Negative for dizziness.  Endo/Heme/Allergies: Positive for polydipsia.       Negative for hypoglycemia Negative for hot or cold intolerance  Psychiatric/Behavioral: The patient has insomnia.        Positive for Stress    PHYSICAL EXAM: Blood pressure 134/83, pulse (!) 58, temperature 98 F (36.7 C), temperature source Oral, height 5\' 4"  (1.626 m), weight 241 lb (109.3 kg), SpO2 98 %. Body mass index is 41.37 kg/m. Physical Exam  Constitutional: She is oriented to person, place, and time. She  appears well-developed and well-nourished.  HENT:  Head: Normocephalic and atraumatic.  Nose: Nose normal.  Eyes: EOM are normal. No scleral icterus.  Neck: Normal range of motion. Neck supple. No thyromegaly present.  Positive for neck acrochordon  Cardiovascular: Normal rate and regular rhythm.  Pulmonary/Chest: Effort normal. No respiratory distress.  Abdominal: Soft. There is no tenderness.  + obesity  Musculoskeletal: Normal range of motion.  Range of Motion normal in all 4 extremities  Neurological: She is alert and oriented to person, place, and time. Coordination normal.  Skin: Skin is warm and dry.  Psychiatric: She has a normal mood and affect. Her behavior is normal.  Vitals reviewed.   RECENT LABS AND TESTS: BMET    Component Value Date/Time   NA 138 08/13/2016 1558   K 3.6 08/13/2016 1558   CL 102 08/13/2016 1558   CO2 26 08/13/2016 1558   GLUCOSE 87 08/13/2016 1558   BUN 13 08/13/2016 1558   CREATININE 0.69 08/13/2016 1558   CALCIUM 9.8 08/13/2016 1558   GFRNONAA >60 08/13/2016 1558   GFRAA >60 08/13/2016 1558   Lab Results  Component Value Date   HGBA1C 5.8 (H) 08/13/2016   No results found for: INSULIN CBC    Component Value Date/Time   WBC 7.0 08/13/2016 1558   RBC 4.39 08/13/2016 1558   HGB 13.0 08/13/2016 1558   HCT 39.6 08/13/2016 1558   PLT 370 08/13/2016 1558   MCV 90.2 08/13/2016 1558   MCH 29.6 08/13/2016 1558   MCHC 32.8 08/13/2016 1558   RDW 12.8 08/13/2016 1558   Iron/TIBC/Ferritin/ %Sat No results found for: IRON, TIBC, FERRITIN, IRONPCTSAT Lipid Panel  No results found for: CHOL, TRIG, HDL, CHOLHDL, VLDL, LDLCALC, LDLDIRECT Hepatic Function Panel  No results found for: PROT, ALBUMIN, AST, ALT, ALKPHOS, BILITOT, BILIDIR, IBILI No results found for: TSH Vitamin D There are no recent lab results  ECG  shows NSR with a rate of 61 BPM INDIRECT CALORIMETER done today shows a VO2 of 302 and a REE of 2097. Her calculated basal  metabolic rate is 5784 thus her basal metabolic rate  is better than expected.    ASSESSMENT AND PLAN: Other fatigue - Plan: EKG 12-Lead, Vitamin B12, CBC With Differential, Folate  Shortness of breath on exertion - Plan: CBC With Differential  Type 2 diabetes mellitus without complication, without long-term current use of insulin (HCC) - Plan: Microalbumin / creatinine urine ratio, Hemoglobin A1c, Insulin, random  Essential hypertension - Plan: Comprehensive metabolic panel  Other hyperlipidemia - Plan: Lipid Panel With LDL/HDL Ratio  Other specified hypothyroidism - Plan: T3, T4, free, TSH, VITAMIN D 25 Hydroxy (Vit-D Deficiency, Fractures)  Depression screening  At risk for heart disease  Class 3 severe obesity with serious comorbidity and body mass index (BMI) of 40.0 to 44.9 in adult, unspecified obesity type (HCC)  PLAN:  Fatigue Klani was informed that her fatigue may be related to obesity, depression or many other causes. Labs will be ordered, and in the meanwhile Clarisa has agreed to work on diet, exercise and weight loss to help with fatigue. Proper sleep hygiene was discussed including the need for 7-8 hours of quality sleep each night. A sleep study was not ordered based on symptoms and Epworth score.  Dyspnea on exertion Jisel's shortness of breath appears to be obesity related and exercise induced. She has agreed to work on weight loss and gradually increase exercise to treat her exercise induced shortness of breath. If Crystal Roberts follows our instructions and loses weight without improvement of her shortness of breath, we will plan to refer to pulmonology. We will monitor this condition regularly. Crystal Roberts agrees to this plan.  Diabetes II Farryn has been given extensive diabetes education by myself today including ideal fasting and post-prandial blood glucose readings, individual ideal Hgb A1c goals and hypoglycemia prevention. We discussed the importance of good blood sugar  control to decrease the likelihood of diabetic complications such as nephropathy, neuropathy, limb loss, blindness, coronary artery disease, and death. We discussed the importance of intensive lifestyle modification including diet, exercise and weight loss as the first line treatment for diabetes. Lawanda agrees to start diet and continue her diabetes medications. We will check labs and she will follow up at the agreed upon time.   Hypertension We discussed sodium restriction, working on healthy weight loss, and a regular exercise program as the means to achieve improved blood pressure control. Crystal Roberts agreed with this plan and agreed to follow up as directed. We will continue to monitor her blood pressure as well as her progress with the above lifestyle modifications. We will check labs and she will continue her medications as prescribed and will watch for signs of hypotension as she continues her lifestyle modifications.   Hyperlipidemia Crystal Roberts was informed of the American Heart Association Guidelines emphasizing intensive lifestyle modifications as the first line treatment for hyperlipidemia. We discussed many lifestyle modifications today in depth, and Crystal Roberts will start to work on decreasing saturated fats such as fatty red meat, butter and many fried foods. She will also increase vegetables and lean protein in her diet and continue to work on exercise and weight loss efforts. We will check labs and Crystal Roberts will continue her medications as prescribed.  Cardiovascular risk counseling Crystal Roberts was given extended (15 minutes) coronary artery disease prevention counseling today. She is 62 y.o. female and has risk factors for heart disease including obesity, hypertension, hyperlipidemia and diabetes. We discussed intensive lifestyle modifications today with an emphasis on specific weight loss instructions and strategies. Pt was also informed of the importance of increasing exercise and decreasing saturated fats to help  prevent heart disease.  Hypothyroid Crystal Roberts was informed of the importance of good thyroid control to help with weight loss efforts. She was also informed that supertheraputic thyroid levels are dangerous and will not improve weight loss results. We will check labs and follow. Depression Screen Crystal Roberts had a mildly positive depression screening. Depression is commonly associated with obesity and often results in emotional eating behaviors. We will monitor this closely and work on CBT to help improve the non-hunger eating patterns. Referral to Psychology may be required if no improvement is seen as she continues in our clinic.  Obesity Crystal Roberts is currently in the action stage of change and her goal is to continue with weight loss efforts She has agreed to follow the Category 2 plan Leshay has been instructed to work up to a goal of 150 minutes of combined cardio and strengthening exercise per week for weight loss and overall health benefits. We discussed the following Behavioral Modification Strategies today: no skipping meals, increasing lean protein intake, decreasing simple carbohydrates  and work on meal planning and easy cooking plans  Juliya has agreed to follow up with our clinic in 2 weeks. She was informed of the importance of frequent follow up visits to maximize her success with intensive lifestyle modifications for her multiple health conditions. She was informed we would discuss her lab results at her next visit unless there is a critical issue that needs to be addressed sooner. Shaneisha agreed to keep her next visit at the agreed upon time to discuss these results.    OBESITY BEHAVIORAL INTERVENTION VISIT  Today's visit was # 1   Starting weight: 241 lbs Starting date: 09/10/17 Today's weight : 241 lbs Today's date: 09/10/2017 Total lbs lost to date: 0 At least 15 minutes were spent on discussing the following behavioral intervention visit.   ASK: We discussed the diagnosis of obesity  with Crystal Mcclintock today and Dreanna agreed to give [place] permission to discuss obesity behavioral modification therapy today.  ASSESS: Akilah has the diagnosis of obesity and her BMI today is 41.35 Anvita is in the action stage of change   ADVISE: Atara was educated on the multiple health risks of obesity as well as the benefit of weight loss to improve her health. She was advised of the need for long term treatment and the importance of lifestyle modifications to improve her current health and to decrease her risk of future health problems.  AGREE: Multiple dietary modification options and treatment options were discussed and  Kassidy agreed to follow the recommendations documented in the above note.  ARRANGE: Ruthene was educated on the importance of frequent visits to treat obesity as outlined per CMS and USPSTF guidelines and agreed to schedule her next follow up appointment today.   I, Doreene Nest, am acting as transcriptionist for Dennard Nip, MD   I have reviewed the above documentation for accuracy and completeness, and I agree with the above. -Dennard Nip, MD

## 2017-09-11 LAB — T3: T3, Total: 113 ng/dL (ref 71–180)

## 2017-09-11 LAB — COMPREHENSIVE METABOLIC PANEL
A/G RATIO: 1.7 (ref 1.2–2.2)
ALBUMIN: 4.4 g/dL (ref 3.5–5.5)
ALT: 26 IU/L (ref 0–32)
AST: 25 IU/L (ref 0–40)
Alkaline Phosphatase: 82 IU/L (ref 39–117)
BUN / CREAT RATIO: 14 (ref 9–23)
BUN: 11 mg/dL (ref 6–24)
Bilirubin Total: 0.3 mg/dL (ref 0.0–1.2)
CALCIUM: 9.6 mg/dL (ref 8.7–10.2)
CO2: 22 mmol/L (ref 20–29)
CREATININE: 0.81 mg/dL (ref 0.57–1.00)
Chloride: 100 mmol/L (ref 96–106)
GFR, EST AFRICAN AMERICAN: 92 mL/min/{1.73_m2} (ref >59)
GFR, EST NON AFRICAN AMERICAN: 80 mL/min/{1.73_m2} (ref >59)
Globulin, Total: 2.6 g/dL (ref 1.5–4.5)
Glucose: 100 mg/dL — ABNORMAL HIGH (ref 65–99)
POTASSIUM: 4.5 mmol/L (ref 3.5–5.2)
SODIUM: 140 mmol/L (ref 134–144)
Total Protein: 7 g/dL (ref 6.0–8.5)

## 2017-09-11 LAB — CBC WITH DIFFERENTIAL
BASOS: 0 %
Basophils Absolute: 0 10*3/uL (ref 0.0–0.2)
EOS (ABSOLUTE): 0.3 10*3/uL (ref 0.0–0.4)
Eos: 5 %
HEMOGLOBIN: 13.1 g/dL (ref 11.1–15.9)
Hematocrit: 39.2 % (ref 34.0–46.6)
IMMATURE GRANULOCYTES: 0 %
Immature Grans (Abs): 0 10*3/uL (ref 0.0–0.1)
LYMPHS ABS: 1.4 10*3/uL (ref 0.7–3.1)
Lymphs: 25 %
MCH: 29.6 pg (ref 26.6–33.0)
MCHC: 33.4 g/dL (ref 31.5–35.7)
MCV: 89 fL (ref 79–97)
MONOCYTES: 6 %
MONOS ABS: 0.3 10*3/uL (ref 0.1–0.9)
Neutrophils Absolute: 3.6 10*3/uL (ref 1.4–7.0)
Neutrophils: 64 %
RBC: 4.42 x10E6/uL (ref 3.77–5.28)
RDW: 13.2 % (ref 12.3–15.4)
WBC: 5.6 10*3/uL (ref 3.4–10.8)

## 2017-09-11 LAB — MICROALBUMIN / CREATININE URINE RATIO
CREATININE, UR: 37.9 mg/dL
Microalb/Creat Ratio: <7.9 mg/g creat (ref 0.0–30.0)
Microalbumin, Urine: <3 ug/mL

## 2017-09-11 LAB — FOLATE: Folate: >20 ng/mL (ref >3.0)

## 2017-09-11 LAB — LIPID PANEL WITH LDL/HDL RATIO
CHOLESTEROL TOTAL: 171 mg/dL (ref 100–199)
HDL: 57 mg/dL (ref >39)
LDL CALC: 93 mg/dL (ref 0–99)
LDL/HDL RATIO: 1.6 ratio (ref 0.0–3.2)
TRIGLYCERIDES: 105 mg/dL (ref 0–149)
VLDL CHOLESTEROL CAL: 21 mg/dL (ref 5–40)

## 2017-09-11 LAB — HEMOGLOBIN A1C
Est. average glucose Bld gHb Est-mCnc: 128 mg/dL
Hgb A1c MFr Bld: 6.1 % — ABNORMAL HIGH (ref 4.8–5.6)

## 2017-09-11 LAB — VITAMIN B12: VITAMIN B 12: 448 pg/mL (ref 232–1245)

## 2017-09-11 LAB — T4, FREE: Free T4: 1.24 ng/dL (ref 0.82–1.77)

## 2017-09-11 LAB — TSH: TSH: 3.39 u[IU]/mL (ref 0.450–4.500)

## 2017-09-11 LAB — INSULIN, RANDOM: INSULIN: 27.5 u[IU]/mL — ABNORMAL HIGH (ref 2.6–24.9)

## 2017-09-11 LAB — VITAMIN D 25 HYDROXY (VIT D DEFICIENCY, FRACTURES): Vit D, 25-Hydroxy: 46.3 ng/mL (ref 30.0–100.0)

## 2017-09-16 MED FILL — metFORMIN HCL 500 MG TABS: 500 | 30 days supply | Qty: 120 | Fill #3

## 2017-09-16 MED FILL — MELOXICAM 15 MG TABLET: 15 | 90 days supply | Qty: 90 | Fill #1

## 2017-09-16 MED FILL — BuPROPion HCL ER (XL) 300 M: 300 | 90 days supply | Qty: 90 | Fill #0

## 2017-09-24 ENCOUNTER — Ambulatory Visit (INDEPENDENT_AMBULATORY_CARE_PROVIDER_SITE_OTHER): Payer: 59 | Admitting: Family Medicine

## 2017-09-24 VITALS — BP 137/78 | HR 69 | Temp 98.2°F | Ht 64.0 in | Wt 232.0 lb

## 2017-09-24 DIAGNOSIS — Z6839 Body mass index (BMI) 39.0-39.9, adult: Secondary | ICD-10-CM

## 2017-09-24 DIAGNOSIS — E119 Type 2 diabetes mellitus without complications: Secondary | ICD-10-CM

## 2017-09-25 NOTE — Progress Notes (Signed)
Office: [phone]  /  Fax: [phone]   HPI:   Chief Complaint: OBESITY Crystal Roberts is here to discuss her progress with her obesity treatment plan. She is on the Category 2 plan and is following her eating plan approximately 70 % of the time. She states she is walking for 10-15 minutes 3 times per week. Crystal Roberts did very well with weight loss but deviated from her plan significantly by the 2nd week. She tried to make good choices but her protein decreased.  Her weight is 232 lb (105.2 kg) today and has had a weight loss of 9 pounds over a period of 2 weeks since her last visit. She has lost 9 lbs since starting treatment with [place].  Diabetes II Crystal Roberts has a diagnosis of diabetes type II. Crystal Roberts is on metformin, her last A1c was at 6.2 and she did not bring in BGs log today. She denies feeling hypoglycemic. She is working on intensive lifestyle modifications including diet, exercise, and weight loss to help control her blood glucose levels.  ALLERGIES: Allergies  Allergen Reactions  . Sulfa Antibiotics Itching and Rash    MEDICATIONS: Current Outpatient Medications on File Prior to Visit  Medication Sig Dispense Refill  . aspirin 81 MG tablet Take 81 mg by mouth daily.    Marland Kitchen buPROPion (WELLBUTRIN XL) 150 MG 24 hr tablet Take 150 mg by mouth daily before breakfast.     . CINNAMON PO Take 1 tablet by mouth at bedtime.    Marland Kitchen levothyroxine (SYNTHROID, LEVOTHROID) 75 MCG tablet Take 75 mcg by mouth daily before breakfast.    . losartan-hydrochlorothiazide (HYZAAR) 100-25 MG per tablet Take 1 tablet by mouth daily.    . meloxicam (MOBIC) 15 MG tablet Take 15 mg by mouth daily.    . metFORMIN (GLUCOPHAGE-XR) 750 MG 24 hr tablet Take 750 mg by mouth 2 (two) times daily with a meal.    . Multiple Vitamin (MULTIVITAMIN) tablet Take 1 tablet by mouth daily.    . simvastatin (ZOCOR) 40 MG tablet Take 40 mg by mouth at bedtime.      No current facility-administered medications on file prior to visit.       PAST MEDICAL HISTORY: Past Medical History:  Diagnosis Date  . Anxiety   . Arthritis    neck- cervical disc herniation   . Asthma   . Back pain   . Cancer ([place])    skin - low back , insitu  melanoma   . Constipation   . Depression   . Diabetes mellitus without complication ([place])   . Dyspnea   . Gallbladder problem   . GERD (gastroesophageal reflux disease)   . H/O cardiovascular stress test 1990   pt. reports that she was having a lot of stress & she was seen by cardiac- had stress test & told that it was negative   . Hyperlipidemia   . Hypertension   . Hypothyroidism   . Leg edema   . Pulmonary embolism ([place]) [place]. at [facility]- on blood thinning med., thought to be related to lack of activity, smoking & BCP    PAST SURGICAL HISTORY: Past Surgical History:  Procedure Laterality Date  . ANTERIOR CERVICAL DECOMP/DISCECTOMY FUSION N/A 08/16/2016   Procedure: Cervical Four-Five Anterior cervical decompression/discectomy/fusion;  Surgeon: Erline Levine, MD;  Location: [facility];  Service: Neurosurgery;  Laterality: N/A;  C4-5 Anterior cervical decompression/discectomy/fusion  . BACK SURGERY     sebaceous cyst removed  . CHOLECYSTECTOMY  1987  umbilical hernia ------ followed cholecystectomy  . CYSTECTOMY Bilateral    cyst on ovaries  . HERNIA REPAIR     umbilical hernia repair   . TUBAL LIGATION      SOCIAL HISTORY: Social History   Tobacco Use  . Smoking status: Former Smoker    Packs/day: 1.00    Years: 10.00    Pack years: 10.00    Types: Cigarettes    Last attempt to quit: 08/14/1986    Years since quitting: 31.1  . Smokeless tobacco: Never Used  Substance Use Topics  . Alcohol use: Yes    Comment: very rare   . Drug use: No    FAMILY HISTORY: Family History  Problem Relation Age of Onset  . Heart disease Mother   . Hypertension Mother   . Hyperlipidemia Mother   . Cancer Father        melenoma  . Diabetes Sister   . Stroke Brother   . Heart  disease Brother   . Diabetes Paternal Grandmother   . Diabetes Sister   . Cancer Sister        kidney; had kidney removed    ROS: Review of Systems  Constitutional: Positive for weight loss.  Endo/Heme/Allergies:       Negative hypoglycemia    PHYSICAL EXAM: Blood pressure 137/78, pulse 69, temperature 98.2 F (36.8 C), temperature source Oral, height 5\' 4"  (1.626 m), weight 232 lb (105.2 kg), SpO2 97 %. Body mass index is 39.82 kg/m. Physical Exam  Constitutional: She is oriented to person, place, and time. She appears well-developed and well-nourished.  Cardiovascular: Normal rate.  Pulmonary/Chest: Effort normal.  Musculoskeletal: Normal range of motion.  Neurological: She is oriented to person, place, and time.  Skin: Skin is warm and dry.  Psychiatric: She has a normal mood and affect. Her behavior is normal.  Vitals reviewed.   RECENT LABS AND TESTS: BMET    Component Value Date/Time   NA 140 09/10/2017 0923   K 4.5 09/10/2017 0923   CL 100 09/10/2017 0923   CO2 22 09/10/2017 0923   GLUCOSE 100 (H) 09/10/2017 0923   GLUCOSE 87 08/13/2016 1558   BUN 11 09/10/2017 0923   CREATININE 0.81 09/10/2017 0923   CALCIUM 9.6 09/10/2017 0923   GFRNONAA 80 09/10/2017 0923   GFRAA 92 09/10/2017 0923   Lab Results  Component Value Date   HGBA1C 6.1 (H) 09/10/2017   HGBA1C 5.8 (H) 08/13/2016   Lab Results  Component Value Date   INSULIN 27.5 (H) 09/10/2017   CBC    Component Value Date/Time   WBC 5.6 09/10/2017 0923   WBC 7.0 08/13/2016 1558   RBC 4.42 09/10/2017 0923   RBC 4.39 08/13/2016 1558   HGB 13.1 09/10/2017 0923   HCT 39.2 09/10/2017 0923   PLT 370 08/13/2016 1558   MCV 89 09/10/2017 0923   MCH 29.6 09/10/2017 0923   MCH 29.6 08/13/2016 1558   MCHC 33.4 09/10/2017 0923   MCHC 32.8 08/13/2016 1558   RDW 13.2 09/10/2017 0923   LYMPHSABS 1.4 09/10/2017 0923   EOSABS 0.3 09/10/2017 0923   BASOSABS 0.0 09/10/2017 0923   Iron/TIBC/Ferritin/ %Sat No  results found for: IRON, TIBC, FERRITIN, IRONPCTSAT Lipid Panel     Component Value Date/Time   CHOL 171 09/10/2017 0923   TRIG 105 09/10/2017 0923   HDL 57 09/10/2017 0923   LDLCALC 93 09/10/2017 0923   Hepatic Function Panel     Component Value Date/Time  PROT 7.0 09/10/2017 0923   ALBUMIN 4.4 09/10/2017 0923   AST 25 09/10/2017 0923   ALT 26 09/10/2017 0923   ALKPHOS 82 09/10/2017 0923   BILITOT 0.3 09/10/2017 0923      Component Value Date/Time   TSH 3.390 09/10/2017 0923    ASSESSMENT AND PLAN: Type 2 diabetes mellitus without complication, without long-term current use of insulin (HCC)  Class 2 severe obesity with serious comorbidity and body mass index (BMI) of 39.0 to 39.9 in adult, unspecified obesity type ([place])  PLAN:  Diabetes II Crystal Roberts has been given extensive diabetes education by myself today including ideal fasting and post-prandial blood glucose readings, individual ideal Hgb A1c goals and hypoglycemia prevention. We discussed the importance of good blood sugar control to decrease the likelihood of diabetic complications such as nephropathy, neuropathy, limb loss, blindness, coronary artery disease, and death. We discussed the importance of intensive lifestyle modification including diet, exercise and weight loss as the first line treatment for diabetes. Crystal Roberts agrees to continue taking metformin, work on diet, and we will recheck labs in 3 months. Crystal Roberts agrees to follow up with our clinic in 2 weeks with Dr. Owens Shark.  I spent > than 50% of the 45 minute visit on counseling as documented in the note.  Obesity Crystal Roberts is currently in the action stage of change. As such, her goal is to continue with weight loss efforts She has agreed to follow the Category 2 plan Crystal Roberts has been instructed to work up to a goal of 150 minutes of combined cardio and strengthening exercise per week for weight loss and overall health benefits. We discussed the following Behavioral  Modification Strategies today: increasing lean protein intake, decreasing simple carbohydrates  and work on meal planning and easy cooking plans   Crystal Roberts has agreed to follow up with our clinic in 2 weeks. She was informed of the importance of frequent follow up visits to maximize her success with intensive lifestyle modifications for her multiple health conditions.   OBESITY BEHAVIORAL INTERVENTION VISIT  Today's visit was # 2   Starting weight: 241 lbs Starting date: 09/10/17 Today's weight : 232 lbs  Today's date: 09/24/2017 Total lbs lost to date: 9    ASK: We discussed the diagnosis of obesity with Crystal Roberts today and Crystal Roberts agreed to give [place] permission to discuss obesity behavioral modification therapy today.  ASSESS: Crystal Roberts has the diagnosis of obesity and her BMI today is 39.8 Crystal Roberts is in the action stage of change   ADVISE: Crystal Roberts was educated on the multiple health risks of obesity as well as the benefit of weight loss to improve her health. She was advised of the need for long term treatment and the importance of lifestyle modifications to improve her current health and to decrease her risk of future health problems.  AGREE: Multiple dietary modification options and treatment options were discussed and  Crystal Roberts agreed to follow the recommendations documented in the above note.  ARRANGE: Crystal Roberts was educated on the importance of frequent visits to treat obesity as outlined per CMS and USPSTF guidelines and agreed to schedule her next follow up appointment today.  I, Trixie Dredge, am acting as transcriptionist for Dennard Nip, MD  I have reviewed the above documentation for accuracy and completeness, and I agree with the above. -Dennard Nip, MD

## 2017-10-08 ENCOUNTER — Ambulatory Visit (INDEPENDENT_AMBULATORY_CARE_PROVIDER_SITE_OTHER): Payer: 59 | Admitting: Bariatrics

## 2017-10-08 ENCOUNTER — Encounter (INDEPENDENT_AMBULATORY_CARE_PROVIDER_SITE_OTHER): Payer: Self-pay | Admitting: Bariatrics

## 2017-10-08 VITALS — BP 120/75 | HR 76 | Temp 98.4°F | Ht 64.0 in | Wt 228.0 lb

## 2017-10-08 DIAGNOSIS — F3289 Other specified depressive episodes: Secondary | ICD-10-CM

## 2017-10-08 DIAGNOSIS — K5900 Constipation, unspecified: Secondary | ICD-10-CM

## 2017-10-08 DIAGNOSIS — E119 Type 2 diabetes mellitus without complications: Secondary | ICD-10-CM

## 2017-10-08 DIAGNOSIS — Z6839 Body mass index (BMI) 39.0-39.9, adult: Secondary | ICD-10-CM | POA: Diagnosis not present

## 2017-10-08 DIAGNOSIS — Z9189 Other specified personal risk factors, not elsewhere classified: Secondary | ICD-10-CM

## 2017-10-09 NOTE — Progress Notes (Signed)
Office: [phone]  /  Fax: [phone]   HPI:   Chief Complaint: OBESITY Crystal Roberts is here to discuss her progress with her obesity treatment plan. She is on the  follow the Category 2 plan and is following her eating plan approximately 100 % of the time. She states she is exercising by walking for 15 minutes 3 times per week. Crystal Roberts is currently denies struggling with meal plan. She denies polyphagia and is minimally stress eating.  Her weight is 228 lb (103.4 kg) today and has had a weight loss of 4 pounds over a period of 2 weeks since her last visit. She has lost 13 lbs since starting treatment with [place].  Diabetes II Crystal Roberts has a diagnosis of diabetes type II. Crystal Roberts states fasting BGs between 115-125 and 2 hour post prandial between 100-106 and denies any hypoglycemic episodes and polyphagia. Last A1c was 6.1. She has been working on intensive lifestyle modifications including diet, exercise, and weight loss to help control her blood glucose levels.  Depression with emotional eating behaviors Crystal Roberts is struggling with emotional eating and using food for comfort to the extent that it is negatively impacting her health. She often snacks when she is not hungry. Crystal Roberts sometimes feels she is out of control and then feels guilty that she made poor food choices. She has been working on behavior modification techniques to help reduce her emotional eating and has been somewhat successful. She is currently taking Wellbutrin.  She shows no sign of suicidal or homicidal ideations.  Depression screen Crystal Roberts 2/9 09/10/2017 07/11/2017  Decreased Interest 2 0  Down, Depressed, Hopeless 1 0  PHQ - 2 Score 3 0  Altered sleeping 1 -  Tired, decreased energy 2 -  Change in appetite 1 -  Feeling bad or failure about yourself  1 -  Trouble concentrating 1 -  Moving slowly or fidgety/restless 0 -  Suicidal thoughts 0 -  PHQ-9 Score 9 -  Difficult doing work/chores Not difficult at all -    Constipation    Crystal Roberts notes constipation for the last few weeks, worse since attempting weight loss. She states BM are less frequent (usually 3-4 times per week now 2 times per week)  and are not hard and painful. She denies hematochezia or melena. She denies drinking less H20 recently.  At risk for cardiovascular disease Crystal Roberts is at a higher than average risk for cardiovascular disease due to obesity. She currently denies any chest pain.  ALLERGIES: Allergies  Allergen Reactions  . Sulfa Antibiotics Itching and Rash    MEDICATIONS: Current Outpatient Medications on File Prior to Visit  Medication Sig Dispense Refill  . aspirin 81 MG tablet Take 81 mg by mouth daily.    Marland Kitchen buPROPion (WELLBUTRIN XL) 150 MG 24 hr tablet Take 150 mg by mouth daily before breakfast.     . CINNAMON PO Take 1 tablet by mouth at bedtime.    Marland Kitchen levothyroxine (SYNTHROID, LEVOTHROID) 75 MCG tablet Take 75 mcg by mouth daily before breakfast.    . losartan-hydrochlorothiazide (HYZAAR) 100-25 MG per tablet Take 1 tablet by mouth daily.    . meloxicam (MOBIC) 15 MG tablet Take 15 mg by mouth daily.    . metFORMIN (GLUCOPHAGE-XR) 750 MG 24 hr tablet Take 750 mg by mouth 2 (two) times daily with a meal.    . Multiple Vitamin (MULTIVITAMIN) tablet Take 1 tablet by mouth daily.    . simvastatin (ZOCOR) 40 MG tablet Take 40 mg by mouth  at bedtime.      No current facility-administered medications on file prior to visit.     PAST MEDICAL HISTORY: Past Medical History:  Diagnosis Date  . Anxiety   . Arthritis    neck- cervical disc herniation   . Asthma   . Back pain   . Cancer ([place])    skin - low back , insitu  melanoma   . Constipation   . Depression   . Diabetes mellitus without complication ([place])   . Dyspnea   . Gallbladder problem   . GERD (gastroesophageal reflux disease)   . H/O cardiovascular stress test 1990   pt. reports that she was having a lot of stress & she was seen by cardiac- had stress test & told that it  was negative   . Hyperlipidemia   . Hypertension   . Hypothyroidism   . Leg edema   . Pulmonary embolism ([place]) Crystal Roberts. at [facility]- on blood thinning med., thought to be related to lack of activity, smoking & BCP    PAST SURGICAL HISTORY: Past Surgical History:  Procedure Laterality Date  . ANTERIOR CERVICAL DECOMP/DISCECTOMY FUSION N/A 08/16/2016   Procedure: Cervical Four-Five Anterior cervical decompression/discectomy/fusion;  Surgeon: Erline Levine, MD;  Location: [facility];  Service: Neurosurgery;  Laterality: N/A;  C4-5 Anterior cervical decompression/discectomy/fusion  . BACK SURGERY     sebaceous cyst removed  . CHOLECYSTECTOMY  2992   umbilical hernia ------ followed cholecystectomy  . CYSTECTOMY Bilateral    cyst on ovaries  . HERNIA REPAIR     umbilical hernia repair   . TUBAL LIGATION      SOCIAL HISTORY: Social History   Tobacco Use  . Smoking status: Former Smoker    Packs/day: 1.00    Years: 10.00    Pack years: 10.00    Types: Cigarettes    Last attempt to quit: 08/14/1986    Years since quitting: 31.1  . Smokeless tobacco: Never Used  Substance Use Topics  . Alcohol use: Yes    Comment: very rare   . Drug use: No    FAMILY HISTORY: Family History  Problem Relation Age of Onset  . Heart disease Mother   . Hypertension Mother   . Hyperlipidemia Mother   . Cancer Father        melenoma  . Diabetes Sister   . Stroke Brother   . Heart disease Brother   . Diabetes Paternal Grandmother   . Diabetes Sister   . Cancer Sister        kidney; had kidney removed    ROS: Review of Systems  Constitutional: Positive for weight loss.  Gastrointestinal: Positive for constipation.  Endo/Heme/Allergies:       Negative for hypoglycemia and polyphagia.   Psychiatric/Behavioral: Positive for depression. Negative for suicidal ideas.       Negative for homicidal ideations     PHYSICAL EXAM: Blood pressure 120/75, pulse 76, temperature 98.4 F (36.9 C),  temperature source Oral, height 5\' 4"  (1.626 m), weight 228 lb (103.4 kg), SpO2 97 %. Body mass index is 39.14 kg/m. Physical Exam  Constitutional: She is oriented to person, place, and time. She appears well-developed and well-nourished.  HENT:  Head: Normocephalic.  Cardiovascular: Normal rate.  Pulmonary/Chest: Effort normal.  Musculoskeletal: Normal range of motion.  Neurological: She is oriented to person, place, and time.  Skin: Skin is warm and dry.  Psychiatric: She has a normal mood and affect. Her behavior is normal.  Nursing note and vitals reviewed.   RECENT LABS AND TESTS: BMET    Component Value Date/Time   NA 140 09/10/2017 0923   K 4.5 09/10/2017 0923   CL 100 09/10/2017 0923   CO2 22 09/10/2017 0923   GLUCOSE 100 (H) 09/10/2017 0923   GLUCOSE 87 08/13/2016 1558   BUN 11 09/10/2017 0923   CREATININE 0.81 09/10/2017 0923   CALCIUM 9.6 09/10/2017 0923   GFRNONAA 80 09/10/2017 0923   GFRAA 92 09/10/2017 0923   Lab Results  Component Value Date   HGBA1C 6.1 (H) 09/10/2017   HGBA1C 5.8 (H) 08/13/2016   Lab Results  Component Value Date   INSULIN 27.5 (H) 09/10/2017   CBC    Component Value Date/Time   WBC 5.6 09/10/2017 0923   WBC 7.0 08/13/2016 1558   RBC 4.42 09/10/2017 0923   RBC 4.39 08/13/2016 1558   HGB 13.1 09/10/2017 0923   HCT 39.2 09/10/2017 0923   PLT 370 08/13/2016 1558   MCV 89 09/10/2017 0923   MCH 29.6 09/10/2017 0923   MCH 29.6 08/13/2016 1558   MCHC 33.4 09/10/2017 0923   MCHC 32.8 08/13/2016 1558   RDW 13.2 09/10/2017 0923   LYMPHSABS 1.4 09/10/2017 0923   EOSABS 0.3 09/10/2017 0923   BASOSABS 0.0 09/10/2017 0923   Iron/TIBC/Ferritin/ %Sat No results found for: IRON, TIBC, FERRITIN, IRONPCTSAT Lipid Panel     Component Value Date/Time   CHOL 171 09/10/2017 0923   TRIG 105 09/10/2017 0923   HDL 57 09/10/2017 0923   LDLCALC 93 09/10/2017 0923   Hepatic Function Panel     Component Value Date/Time   PROT 7.0  09/10/2017 0923   ALBUMIN 4.4 09/10/2017 0923   AST 25 09/10/2017 0923   ALT 26 09/10/2017 0923   ALKPHOS 82 09/10/2017 0923   BILITOT 0.3 09/10/2017 0923      Component Value Date/Time   TSH 3.390 09/10/2017 0923    ASSESSMENT AND PLAN: Type 2 diabetes mellitus without complication, without long-term current use of insulin (HCC)  Constipation, unspecified constipation type  Other depression - with emotional eating  At risk for heart disease  Class 2 severe obesity with serious comorbidity and body mass index (BMI) of 39.0 to 39.9 in adult, unspecified obesity type ([place])  PLAN: Diabetes II Crystal Roberts has been given extensive diabetes education by myself today including ideal fasting and post-prandial blood glucose readings, individual ideal HgA1c goals  and hypoglycemia prevention. We discussed the importance of good blood sugar control to decrease the likelihood of diabetic complications such as nephropathy, neuropathy, limb loss, blindness, coronary artery disease, and death. We discussed the importance of intensive lifestyle modification including diet, exercise and weight loss as the first line treatment for diabetes. Crystal Roberts agrees to continue her diabetes medications and will follow up at the agreed upon time.  Depression with Emotional Eating Behaviors We discussed behavior modification techniques today to help Crystal Roberts deal with her emotional eating and depression. She has agreed to continue Wellbutrin SR 150 mg qd and she agreed to follow up as directed.  Constipation  Crystal Roberts was informed decrease bowel movement frequency is normal while losing weight, but stools should not be hard or painful. She was advised to increase her H20 intake and work on increasing her fiber intake. High fiber foods were discussed today. Crystal Roberts agreed to take Miralax 17g 3-4 times a week with a full glass of water. Agrees to follow up with our clinic as directed.   Obesity Crystal Roberts  is currently in the action  stage of change. As such, her goal is to continue with weight loss efforts She has agreed to follow the Category 2 plan Crystal Roberts has been instructed to work up to a goal of 150 minutes of combined cardio and strengthening exercise per week for weight loss and overall health benefits. We discussed the following Behavioral Modification Strategies today: increasing lean protein intake, decreasing simple carbohydrates,  increasing vegetables, increasing water intake.    Crystal Roberts has agreed to follow up with our clinic in 2 weeks. She was informed of the importance of frequent follow up visits to maximize her success with intensive lifestyle modifications for her multiple health conditions.   OBESITY BEHAVIORAL INTERVENTION VISIT  Today's visit was # 3   Starting weight: 241 lb Starting date: 09/10/17 Today's weight : 228 lb Today's date:10/08/17 Total lbs lost to date: 13 lb    ASK: We discussed the diagnosis of obesity with Bo Mcclintock today and Latresha agreed to give [place] permission to discuss obesity behavioral modification therapy today.  ASSESS: Crystal Roberts has the diagnosis of obesity and her BMI today is 39.12 Crystal Roberts is in the action stage of change   ADVISE: Crystal Roberts was educated on the multiple health risks of obesity as well as the benefit of weight loss to improve her health. She was advised of the need for long term treatment and the importance of lifestyle modifications to improve her current health and to decrease her risk of future health problems.  AGREE: Multiple dietary modification options and treatment options were discussed and  Crystal Roberts agreed to follow the recommendations documented in the above note.  ARRANGE: Crystal Roberts was educated on the importance of frequent visits to treat obesity as outlined per CMS and USPSTF guidelines and agreed to schedule her next follow up appointment today.  Crystal Roberts, am acting as transcriptionist for [employer], DO   I have reviewed the  above documentation for accuracy and completeness, and I agree with the above. -Jearld Lesch, DO

## 2017-10-17 ENCOUNTER — Encounter (INDEPENDENT_AMBULATORY_CARE_PROVIDER_SITE_OTHER): Payer: Self-pay | Admitting: Bariatrics

## 2017-10-22 ENCOUNTER — Ambulatory Visit (INDEPENDENT_AMBULATORY_CARE_PROVIDER_SITE_OTHER): Payer: 59 | Admitting: Bariatrics

## 2017-10-22 VITALS — BP 133/79 | HR 57 | Temp 98.0°F | Ht 64.0 in | Wt 224.0 lb

## 2017-10-22 DIAGNOSIS — Z9189 Other specified personal risk factors, not elsewhere classified: Secondary | ICD-10-CM | POA: Diagnosis not present

## 2017-10-22 DIAGNOSIS — Z6838 Body mass index (BMI) 38.0-38.9, adult: Secondary | ICD-10-CM

## 2017-10-22 DIAGNOSIS — F3289 Other specified depressive episodes: Secondary | ICD-10-CM

## 2017-10-22 DIAGNOSIS — E119 Type 2 diabetes mellitus without complications: Secondary | ICD-10-CM | POA: Diagnosis not present

## 2017-10-22 MED ORDER — BUPROPION HCL ER (SR) 200 MG PO TB12: 200.0000 mg | ORAL_TABLET | Freq: Every day | ORAL | 0 refills | Status: DC

## 2017-10-23 ENCOUNTER — Other Ambulatory Visit (INDEPENDENT_AMBULATORY_CARE_PROVIDER_SITE_OTHER): Payer: Self-pay | Admitting: Bariatrics

## 2017-10-23 ENCOUNTER — Encounter (INDEPENDENT_AMBULATORY_CARE_PROVIDER_SITE_OTHER): Payer: Self-pay | Admitting: Bariatrics

## 2017-10-24 DIAGNOSIS — E119 Type 2 diabetes mellitus without complications: Secondary | ICD-10-CM | POA: Diagnosis not present

## 2017-10-24 DIAGNOSIS — M25512 Pain in left shoulder: Secondary | ICD-10-CM | POA: Diagnosis not present

## 2017-10-24 DIAGNOSIS — Z6838 Body mass index (BMI) 38.0-38.9, adult: Secondary | ICD-10-CM | POA: Diagnosis not present

## 2017-10-24 DIAGNOSIS — Z1389 Encounter for screening for other disorder: Secondary | ICD-10-CM | POA: Diagnosis not present

## 2017-10-24 DIAGNOSIS — M1991 Primary osteoarthritis, unspecified site: Secondary | ICD-10-CM | POA: Diagnosis not present

## 2017-10-24 MED FILL — metFORMIN HCL 500 MG TABS: 500 | 30 days supply | Qty: 120 | Fill #0

## 2017-10-24 MED FILL — predniSONE 10 MG TABS: 10 | 15 days supply | Qty: 45 | Fill #0

## 2017-10-27 ENCOUNTER — Encounter (INDEPENDENT_AMBULATORY_CARE_PROVIDER_SITE_OTHER): Payer: Self-pay | Admitting: Bariatrics

## 2017-10-27 DIAGNOSIS — Z6841 Body Mass Index (BMI) 40.0 and over, adult: Secondary | ICD-10-CM | POA: Insufficient documentation

## 2017-10-27 DIAGNOSIS — Z6835 Body mass index (BMI) 35.0-35.9, adult: Secondary | ICD-10-CM

## 2017-10-27 NOTE — Progress Notes (Signed)
Office: [phone]  /  Fax: [phone]   HPI:   Chief Complaint: OBESITY Crystal Roberts is here to discuss her progress with her obesity treatment plan. She is on the  follow the Category 2 plan and is following her eating plan approximately 100 % of the time. She states she is exercising 15 minutes 2-3 times per week. Crystal Roberts occasionally does not eat everything on her plate. Her hunger is reasonably controlled.  Her weight is 224 lb (101.6 kg) today and has had a weight loss of 4 pounds over a period of 3 weeks since her last visit. She has lost 17 lbs since starting treatment with [place].  Diabetes II [name] has a diagnosis of diabetes type II. Davis states BGs have been labile ranging between 110 and 120 and denies any hypoglycemic episodes. Her 2 hour PP range 90-110. Last A1c was Hemoglobin A1C Latest Ref Rng & Units 09/10/2017  HGBA1C 4.8 - 5.6 % 6.1(H)  Some recent data might be hidden    She has been working on intensive lifestyle modifications including diet, exercise, and weight loss to help control her blood glucose levels.  Depression with emotional eating behaviors Crystal Roberts is struggling with emotional eating and using food for comfort to the extent that it is negatively impacting her health. She often snacks when she is not hungry. Crystal Roberts sometimes feels she is out of control and then feels guilty that she made poor food choices. She has been working on behavior modification techniques to help reduce her emotional eating and has been minimally successful. She shows no sign of suicidal or homicidal ideations.  Depression screen [facility] 2/9 09/10/2017 07/11/2017  Decreased Interest 2 0  Down, Depressed, Hopeless 1 0  PHQ - 2 Score 3 0  Altered sleeping 1 -  Tired, decreased energy 2 -  Change in appetite 1 -  Feeling bad or failure about yourself  1 -  Trouble concentrating 1 -  Moving slowly or fidgety/restless 0 -  Suicidal thoughts 0 -  PHQ-9 Score 9 -  Difficult doing work/chores Not  difficult at all -   At risk for cardiovascular disease [name] is at a higher than average risk for cardiovascular disease due to obesity. She currently denies any chest pain.   ALLERGIES: Allergies  Allergen Reactions  . Sulfa Antibiotics Itching and Rash    MEDICATIONS: Current Outpatient Medications on File Prior to Visit  Medication Sig Dispense Refill  . aspirin 81 MG tablet Take 81 mg by mouth daily.    Marland Kitchen CINNAMON PO Take 1 tablet by mouth at bedtime.    Marland Kitchen levothyroxine (SYNTHROID, LEVOTHROID) 75 MCG tablet Take 75 mcg by mouth daily before breakfast.    . losartan-hydrochlorothiazide (HYZAAR) 100-25 MG per tablet Take 1 tablet by mouth daily.    . meloxicam (MOBIC) 15 MG tablet Take 15 mg by mouth daily.    . metFORMIN (GLUCOPHAGE-XR) 750 MG 24 hr tablet Take 750 mg by mouth 2 (two) times daily with a meal.    . Multiple Vitamin (MULTIVITAMIN) tablet Take 1 tablet by mouth daily.    . simvastatin (ZOCOR) 40 MG tablet Take 40 mg by mouth at bedtime.      No current facility-administered medications on file prior to visit.     PAST MEDICAL HISTORY: Past Medical History:  Diagnosis Date  . Anxiety   . Arthritis    neck- cervical disc herniation   . Asthma   . Back pain   . Cancer ([place])  skin - low back , insitu  melanoma   . Constipation   . Depression   . Diabetes mellitus without complication ([place])   . Dyspnea   . Gallbladder problem   . GERD (gastroesophageal reflux disease)   . H/O cardiovascular stress test 1990   pt. reports that she was having a lot of stress & she was seen by cardiac- had stress test & told that it was negative   . Hyperlipidemia   . Hypertension   . Hypothyroidism   . Leg edema   . Pulmonary embolism ([place]) [place]. at [facility]- on blood thinning med., thought to be related to lack of activity, smoking & BCP    PAST SURGICAL HISTORY: Past Surgical History:  Procedure Laterality Date  . ANTERIOR CERVICAL DECOMP/DISCECTOMY FUSION N/A  08/16/2016   Procedure: Cervical Four-Five Anterior cervical decompression/discectomy/fusion;  Surgeon: Erline Levine, MD;  Location: [place];  Service: Neurosurgery;  Laterality: N/A;  C4-5 Anterior cervical decompression/discectomy/fusion  . BACK SURGERY     sebaceous cyst removed  . CHOLECYSTECTOMY  1610   umbilical hernia ------ followed cholecystectomy  . CYSTECTOMY Bilateral    cyst on ovaries  . HERNIA REPAIR     umbilical hernia repair   . TUBAL LIGATION      SOCIAL HISTORY: Social History   Tobacco Use  . Smoking status: Former Smoker    Packs/day: 1.00    Years: 10.00    Pack years: 10.00    Types: Cigarettes    Last attempt to quit: 08/14/1986    Years since quitting: 31.2  . Smokeless tobacco: Never Used  Substance Use Topics  . Alcohol use: Yes    Comment: very rare   . Drug use: No    FAMILY HISTORY: Family History  Problem Relation Age of Onset  . Heart disease Mother   . Hypertension Mother   . Hyperlipidemia Mother   . Cancer Father        melenoma  . Diabetes Sister   . Stroke Brother   . Heart disease Brother   . Diabetes Paternal Grandmother   . Diabetes Sister   . Cancer Sister        kidney; had kidney removed    ROS: Review of Systems  Constitutional: Positive for weight loss.  All other systems reviewed and are negative.   PHYSICAL EXAM: Blood pressure 133/79, pulse (!) 57, temperature 98 F (36.7 C), temperature source Oral, height 5\' 4"  (1.626 m), weight 224 lb (101.6 kg), SpO2 99 %. Body mass index is 38.45 kg/m. Physical Exam  Constitutional: She is oriented to person, place, and time. She appears well-developed and well-nourished.  HENT:  Head: Normocephalic.  Eyes: EOM are normal.  Neck: Normal range of motion.  Pulmonary/Chest: Effort normal.  Musculoskeletal: Normal range of motion.  Neurological: She is alert and oriented to person, place, and time.  Skin: Skin is warm and dry.  Psychiatric: She has a normal mood and  affect.  Vitals reviewed.   RECENT LABS AND TESTS: BMET    Component Value Date/Time   NA 140 09/10/2017 0923   K 4.5 09/10/2017 0923   CL 100 09/10/2017 0923   CO2 22 09/10/2017 0923   GLUCOSE 100 (H) 09/10/2017 0923   GLUCOSE 87 08/13/2016 1558   BUN 11 09/10/2017 0923   CREATININE 0.81 09/10/2017 0923   CALCIUM 9.6 09/10/2017 0923   GFRNONAA 80 09/10/2017 0923   GFRAA 92 09/10/2017 0923   Lab Results  Component Value Date   HGBA1C 6.1 (H) 09/10/2017   HGBA1C 5.8 (H) 08/13/2016   Lab Results  Component Value Date   INSULIN 27.5 (H) 09/10/2017   CBC    Component Value Date/Time   WBC 5.6 09/10/2017 0923   WBC 7.0 08/13/2016 1558   RBC 4.42 09/10/2017 0923   RBC 4.39 08/13/2016 1558   HGB 13.1 09/10/2017 0923   HCT 39.2 09/10/2017 0923   PLT 370 08/13/2016 1558   MCV 89 09/10/2017 0923   MCH 29.6 09/10/2017 0923   MCH 29.6 08/13/2016 1558   MCHC 33.4 09/10/2017 0923   MCHC 32.8 08/13/2016 1558   RDW 13.2 09/10/2017 0923   LYMPHSABS 1.4 09/10/2017 0923   EOSABS 0.3 09/10/2017 0923   BASOSABS 0.0 09/10/2017 0923   Iron/TIBC/Ferritin/ %Sat No results found for: IRON, TIBC, FERRITIN, IRONPCTSAT Lipid Panel     Component Value Date/Time   CHOL 171 09/10/2017 0923   TRIG 105 09/10/2017 0923   HDL 57 09/10/2017 0923   LDLCALC 93 09/10/2017 0923   Hepatic Function Panel     Component Value Date/Time   PROT 7.0 09/10/2017 0923   ALBUMIN 4.4 09/10/2017 0923   AST 25 09/10/2017 0923   ALT 26 09/10/2017 0923   ALKPHOS 82 09/10/2017 0923   BILITOT 0.3 09/10/2017 0923      Component Value Date/Time   TSH 3.390 09/10/2017 0923    ASSESSMENT AND PLAN: Type 2 diabetes mellitus without complication, without long-term current use of insulin (HCC)  Other depression - with emotional eating - Plan: DISCONTINUED: buPROPion (WELLBUTRIN SR) 200 MG 12 hr tablet  At risk for heart disease  Class 2 severe obesity with serious comorbidity and body mass index (BMI)  of 38.0 to 38.9 in adult, unspecified obesity type ([place])  PLAN: Diabetes II Jareli has been given extensive diabetes education by myself today including ideal fasting and post-prandial blood glucose readings, individual ideal HgA1c goals  and hypoglycemia prevention. We discussed the importance of good blood sugar control to decrease the likelihood of diabetic complications such as nephropathy, neuropathy, limb loss, blindness, coronary artery disease, and death. We discussed the importance of intensive lifestyle modification including diet, exercise and weight loss as the first line treatment for diabetes. Lanelle agrees to continue her diabetes medications and will follow up at the agreed upon time.  Depression with Emotional Eating Behaviors We discussed behavior modification techniques today to help Keelie deal with her emotional eating and depression. She has agreed to increase her Wellbutrin SR to 200  mg qd, a prescription was written today for a 30 day supply and agreed to follow up as directed.  Cardiovascular risk counselling Jurni was given extended (15 minutes) coronary artery disease prevention counseling today. She is 62 y.o. female and has risk factors for heart disease including obesity. We discussed intensive lifestyle modifications today with an emphasis on specific weight loss instructions and strategies. Pt was also informed of the importance of increasing exercise and decreasing saturated fats to help prevent heart disease.  Obesity Maeva is currently in the action stage of change. As such, her goal is to continue with weight loss efforts She has agreed to follow the Category 2 plan Nemiah has been instructed to work up to a goal of 150 minutes of combined cardio and strengthening exercise per week for weight loss and overall health benefits. We discussed the following Behavioral Modification Stratagies today: increasing lean protein intake, decreasing simple carbohydrates  and  increasing vegetables  Carlye has agreed to follow up with our clinic in 3 weeks. She was informed of the importance of frequent follow up visits to maximize her success with intensive lifestyle modifications for her multiple health conditions.   OBESITY BEHAVIORAL INTERVENTION VISIT  Today's visit was # 4   Starting weight: 241lb Starting date: 09/10/17 Today's weight : Weight: 224 lb (101.6 kg)  Today's date: 10/22/17 Total lbs lost to date: 17    ASK: We discussed the diagnosis of obesity with Bo Mcclintock today and [place] agreed to give [place] permission to discuss obesity behavioral modification therapy today.  ASSESS: Merrily has the diagnosis of obesity and her BMI today is @TBMI @ [place] is in the action stage of change   ADVISE: Serenah was educated on the multiple health risks of obesity as well as the benefit of weight loss to improve her health. She was advised of the need for long term treatment and the importance of lifestyle modifications to improve her current health and to decrease her risk of future health problems.  AGREE: Multiple dietary modification options and treatment options were discussed and  Rosita agreed to follow the recommendations documented in the above note.  ARRANGE: Zanita was educated on the importance of frequent visits to treat obesity as outlined per CMS and USPSTF guidelines and agreed to schedule her next follow up appointment today.  I, April Moore, am acting as transcriptionist for Dr Elvera Bicker.  I have reviewed the above documentation for accuracy and completeness, and I agree with the above. -Jearld Lesch, DO

## 2017-11-03 MED FILL — FREESTYLE LITE TEST STRIP: 50 days supply | Qty: 100 | Fill #0

## 2017-11-06 MED FILL — SIMVASTATIN 40 MG TABLET: 40 | 90 days supply | Qty: 90 | Fill #2

## 2017-11-12 ENCOUNTER — Encounter (INDEPENDENT_AMBULATORY_CARE_PROVIDER_SITE_OTHER): Payer: Self-pay | Admitting: Bariatrics

## 2017-11-12 ENCOUNTER — Ambulatory Visit (INDEPENDENT_AMBULATORY_CARE_PROVIDER_SITE_OTHER): Payer: 59 | Admitting: Bariatrics

## 2017-11-12 VITALS — BP 132/72 | HR 64 | Temp 98.2°F | Ht 64.0 in | Wt 215.0 lb

## 2017-11-12 DIAGNOSIS — Z6837 Body mass index (BMI) 37.0-37.9, adult: Secondary | ICD-10-CM | POA: Diagnosis not present

## 2017-11-12 DIAGNOSIS — I10 Essential (primary) hypertension: Secondary | ICD-10-CM | POA: Diagnosis not present

## 2017-11-12 DIAGNOSIS — E119 Type 2 diabetes mellitus without complications: Secondary | ICD-10-CM | POA: Diagnosis not present

## 2017-11-12 DIAGNOSIS — F3289 Other specified depressive episodes: Secondary | ICD-10-CM

## 2017-11-13 ENCOUNTER — Encounter: Payer: Self-pay | Admitting: General Surgery

## 2017-11-13 ENCOUNTER — Ambulatory Visit: Payer: 59 | Admitting: General Surgery

## 2017-11-13 ENCOUNTER — Encounter (INDEPENDENT_AMBULATORY_CARE_PROVIDER_SITE_OTHER): Payer: Self-pay | Admitting: Bariatrics

## 2017-11-13 VITALS — BP 171/93 | HR 75 | Temp 96.9°F | Resp 16 | Wt 219.2 lb

## 2017-11-13 DIAGNOSIS — L723 Sebaceous cyst: Secondary | ICD-10-CM | POA: Diagnosis not present

## 2017-11-13 NOTE — Patient Instructions (Signed)
Epidermal Cyst An epidermal cyst is a small, painless lump under your skin. It may be called an epidermal inclusion cyst or an infundibular cyst. The cyst contains a grayish-white, bad-smelling substance (keratin). It is important not to pop epidermal cysts yourself. These cysts are usually harmless (benign), but they can get infected. Symptoms of infection may include:  Redness.  Inflammation.  Tenderness.  Warmth.  Fever.  A grayish-white, bad-smelling substance draining from the cyst.  Pus draining from the cyst.  Follow these instructions at home:  Take over-the-counter and prescription medicines only as told by your doctor.  If you were prescribed an antibiotic, use it as told by your doctor. Do not stop using the antibiotic even if you start to feel better.  Keep the area around your cyst clean and dry.  Wear loose, dry clothing.  Do not try to pop your cyst.  Avoid touching your cyst.  Check your cyst every day for signs of infection.  Keep all follow-up visits as told by your doctor. This is important. How is this prevented?  Wear clean, dry, clothing.  Avoid wearing tight clothing.  Keep your skin clean and dry. Shower or take baths every day.  Wash your body with a benzoyl peroxide wash when you shower or bathe. Contact a health care provider if:  Your cyst has symptoms of infection.  Your condition is not improving or is getting worse.  You have a cyst that looks different from other cysts you have had.  You have a fever. Get help right away if:  Redness spreads from the cyst into the surrounding area. This information is not intended to replace advice given to you by your health care provider. Make sure you discuss any questions you have with your health care provider. Document Released: 02/08/2004 Document Revised: 08/30/2015 Document Reviewed: 11/02/2014 Elsevier Interactive Patient Education  2018 Elsevier Inc.  

## 2017-11-13 NOTE — H&P (Signed)
Crystal Roberts; [id number]; 1955/02/23   HPI Patient is a 62 year old white female who was referred to my care by Dr. Hilma Favors for evaluation treatment of a cyst on her back.  She had a sebaceous cyst removed from her back in the same area 10 years ago.  She states that recently the cyst seems to have increased in size.  No drainage has been noted, but it is tender to touch when pressure is applied.  She currently has no pain. Past Medical History:  Diagnosis Date  . Anxiety   . Arthritis    neck- cervical disc herniation   . Asthma   . Back pain   . Cancer ([place])    skin - low back , insitu  melanoma   . Constipation   . Depression   . Diabetes mellitus without complication ([place])   . Dyspnea   . Gallbladder problem   . GERD (gastroesophageal reflux disease)   . H/O cardiovascular stress test 1990   pt. reports that she was having a lot of stress & she was seen by cardiac- had stress test & told that it was negative   . Hyperlipidemia   . Hypertension   . Hypothyroidism   . Leg edema   . Pulmonary embolism ([place]) [place]. at [facility]- on blood thinning med., thought to be related to lack of activity, smoking & BCP    Past Surgical History:  Procedure Laterality Date  . ANTERIOR CERVICAL DECOMP/DISCECTOMY FUSION N/A 08/16/2016   Procedure: Cervical Four-Five Anterior cervical decompression/discectomy/fusion;  Surgeon: Erline Levine, MD;  Location: [place];  Service: Neurosurgery;  Laterality: N/A;  C4-5 Anterior cervical decompression/discectomy/fusion  . BACK SURGERY     sebaceous cyst removed  . CHOLECYSTECTOMY  5465   umbilical hernia ------ followed cholecystectomy  . CYSTECTOMY Bilateral    cyst on ovaries  . HERNIA REPAIR     umbilical hernia repair   . TUBAL LIGATION      Family History  Problem Relation Age of Onset  . Heart disease Mother   . Hypertension Mother   . Hyperlipidemia Mother   . Cancer Father        melenoma  . Diabetes Sister   . Stroke Brother   .  Heart disease Brother   . Diabetes Paternal Grandmother   . Diabetes Sister   . Cancer Sister        kidney; had kidney removed    Current Outpatient Medications on File Prior to Visit  Medication Sig Dispense Refill  . buPROPion (WELLBUTRIN XL) 300 MG 24 hr tablet Take 300 mg by mouth daily.    Marland Kitchen aspirin 81 MG tablet Take 81 mg by mouth daily.    Marland Kitchen CINNAMON PO Take 1 tablet by mouth at bedtime.    Marland Kitchen levothyroxine (SYNTHROID, LEVOTHROID) 75 MCG tablet Take 75 mcg by mouth daily before breakfast.    . losartan-hydrochlorothiazide (HYZAAR) 100-25 MG per tablet Take 1 tablet by mouth daily.    . meloxicam (MOBIC) 15 MG tablet Take 15 mg by mouth daily.    . metFORMIN (GLUCOPHAGE-XR) 750 MG 24 hr tablet Take 750 mg by mouth 2 (two) times daily with a meal.    . Multiple Vitamin (MULTIVITAMIN) tablet Take 1 tablet by mouth daily.    . simvastatin (ZOCOR) 40 MG tablet Take 40 mg by mouth at bedtime.      No current facility-administered medications on file prior to visit.     Allergies  Allergen Reactions  . Sulfa Antibiotics Itching and Rash    Social History   Substance and Sexual Activity  Alcohol Use Yes   Comment: very rare     Social History   Tobacco Use  Smoking Status Former Smoker  . Packs/day: 1.00  . Years: 10.00  . Pack years: 10.00  . Types: Cigarettes  . Last attempt to quit: 08/14/1986  . Years since quitting: 31.2  Smokeless Tobacco Never Used    Review of Systems  Constitutional: Negative.   HENT: Positive for ear pain.   Eyes: Negative.   Respiratory: Negative.   Cardiovascular: Negative.   Gastrointestinal: Positive for heartburn.  Genitourinary: Negative.   Musculoskeletal: Negative.   Skin: Negative.   Neurological: Positive for headaches.  Endo/Heme/Allergies:       History of clotting issues in the remote past  Psychiatric/Behavioral: Negative.     Objective   Vitals:   11/13/17 0921  BP: (!) 171/93  Pulse: 75  Resp: 16  Temp: (!)  96.9 F (36.1 C)    Physical Exam  Constitutional: She is oriented to person, place, and time. She appears well-developed and well-nourished. No distress.  HENT:  Head: Normocephalic and atraumatic.  Cardiovascular: Normal rate, regular rhythm and normal heart sounds. Exam reveals no gallop and no friction rub.  No murmur heard. Pulmonary/Chest: Effort normal and breath sounds normal. No stridor. No respiratory distress. She has no wheezes. She has no rales.  Neurological: She is alert and oriented to person, place, and time.  Skin: Skin is warm and dry.  3 cm oval sebaceous cyst with punctum present beneath surgical scar in left upper back.  Tender to touch.  No purulent drainage noted.  No erythema noted.  Vitals reviewed. Primary care office notes reviewed  Assessment  Recurrent sebaceous cyst, back Plan   Patient is scheduled for excision of the 3 cm sebaceous cyst, back on 11/21/2017.  The risks and benefits of the procedure including bleeding, infection, and recurrence of the cyst were fully explained to the patient, who gave informed consent.

## 2017-11-13 NOTE — Progress Notes (Signed)
Crystal Roberts; [id number]; 1955/07/07   HPI Patient is a 62 year old white female who was referred to my care by Dr. Hilma Favors for evaluation treatment of a cyst on her back.  She had a sebaceous cyst removed from her back in the same area 10 years ago.  She states that recently the cyst seems to have increased in size.  No drainage has been noted, but it is tender to touch when pressure is applied.  She currently has no pain. Past Medical History:  Diagnosis Date  . Anxiety   . Arthritis    neck- cervical disc herniation   . Asthma   . Back pain   . Cancer ([place])    skin - low back , insitu  melanoma   . Constipation   . Depression   . Diabetes mellitus without complication ([place])   . Dyspnea   . Gallbladder problem   . GERD (gastroesophageal reflux disease)   . H/O cardiovascular stress test 1990   pt. reports that she was having a lot of stress & she was seen by cardiac- had stress test & told that it was negative   . Hyperlipidemia   . Hypertension   . Hypothyroidism   . Leg edema   . Pulmonary embolism ([place]) [place]. at [facility]- on blood thinning med., thought to be related to lack of activity, smoking & BCP    Past Surgical History:  Procedure Laterality Date  . ANTERIOR CERVICAL DECOMP/DISCECTOMY FUSION N/A 08/16/2016   Procedure: Cervical Four-Five Anterior cervical decompression/discectomy/fusion;  Surgeon: Erline Levine, MD;  Location: [place];  Service: Neurosurgery;  Laterality: N/A;  C4-5 Anterior cervical decompression/discectomy/fusion  . BACK SURGERY     sebaceous cyst removed  . CHOLECYSTECTOMY  1829   umbilical hernia ------ followed cholecystectomy  . CYSTECTOMY Bilateral    cyst on ovaries  . HERNIA REPAIR     umbilical hernia repair   . TUBAL LIGATION      Family History  Problem Relation Age of Onset  . Heart disease Mother   . Hypertension Mother   . Hyperlipidemia Mother   . Cancer Father        melenoma  . Diabetes Sister   . Stroke Brother   .  Heart disease Brother   . Diabetes Paternal Grandmother   . Diabetes Sister   . Cancer Sister        kidney; had kidney removed    Current Outpatient Medications on File Prior to Visit  Medication Sig Dispense Refill  . buPROPion (WELLBUTRIN XL) 300 MG 24 hr tablet Take 300 mg by mouth daily.    Marland Kitchen aspirin 81 MG tablet Take 81 mg by mouth daily.    Marland Kitchen CINNAMON PO Take 1 tablet by mouth at bedtime.    Marland Kitchen levothyroxine (SYNTHROID, LEVOTHROID) 75 MCG tablet Take 75 mcg by mouth daily before breakfast.    . losartan-hydrochlorothiazide (HYZAAR) 100-25 MG per tablet Take 1 tablet by mouth daily.    . meloxicam (MOBIC) 15 MG tablet Take 15 mg by mouth daily.    . metFORMIN (GLUCOPHAGE-XR) 750 MG 24 hr tablet Take 750 mg by mouth 2 (two) times daily with a meal.    . Multiple Vitamin (MULTIVITAMIN) tablet Take 1 tablet by mouth daily.    . simvastatin (ZOCOR) 40 MG tablet Take 40 mg by mouth at bedtime.      No current facility-administered medications on file prior to visit.     Allergies  Allergen Reactions  . Sulfa Antibiotics Itching and Rash    Social History   Substance and Sexual Activity  Alcohol Use Yes   Comment: very rare     Social History   Tobacco Use  Smoking Status Former Smoker  . Packs/day: 1.00  . Years: 10.00  . Pack years: 10.00  . Types: Cigarettes  . Last attempt to quit: 08/14/1986  . Years since quitting: 31.2  Smokeless Tobacco Never Used    Review of Systems  Constitutional: Negative.   HENT: Positive for ear pain.   Eyes: Negative.   Respiratory: Negative.   Cardiovascular: Negative.   Gastrointestinal: Positive for heartburn.  Genitourinary: Negative.   Musculoskeletal: Negative.   Skin: Negative.   Neurological: Positive for headaches.  Endo/Heme/Allergies:       History of clotting issues in the remote past  Psychiatric/Behavioral: Negative.     Objective   Vitals:   11/13/17 0921  BP: (!) 171/93  Pulse: 75  Resp: 16  Temp: (!)  96.9 F (36.1 C)    Physical Exam  Constitutional: She is oriented to person, place, and time. She appears well-developed and well-nourished. No distress.  HENT:  Head: Normocephalic and atraumatic.  Cardiovascular: Normal rate, regular rhythm and normal heart sounds. Exam reveals no gallop and no friction rub.  No murmur heard. Pulmonary/Chest: Effort normal and breath sounds normal. No stridor. No respiratory distress. She has no wheezes. She has no rales.  Neurological: She is alert and oriented to person, place, and time.  Skin: Skin is warm and dry.  3 cm oval sebaceous cyst with punctum present beneath surgical scar in left upper back.  Tender to touch.  No purulent drainage noted.  No erythema noted.  Vitals reviewed. Primary care office notes reviewed  Assessment  Recurrent sebaceous cyst, back Plan   Patient is scheduled for excision of the 3 cm sebaceous cyst, back on 11/21/2017.  The risks and benefits of the procedure including bleeding, infection, and recurrence of the cyst were fully explained to the patient, who gave informed consent.

## 2017-11-13 NOTE — Progress Notes (Signed)
Office: [phone]  /  Fax: [phone]   HPI:   Chief Complaint: OBESITY Crystal Roberts is here to discuss her progress with her obesity treatment plan. She is on the  follow the Category 2 plan and is following her eating plan approximately 100 % of the time. She states she is exercising by walking for 15  minutes 2-3 times per week. Bryson states she is sticking to the diet. She is taking prednisone, feels it almost fixed her left shoulder.  Her weight is 215 lb (97.5 kg) today and has had a weight loss of 9 pounds over a period of 3 weeks since her last visit. She has lost 26 lbs since starting treatment with [place].  Diabetes II Crystal Roberts has a diagnosis of diabetes type II. Crystal Roberts states fasting  BGs range between 100 and 110, occasionally in the 120s and denies any hypoglycemic episodes. Last A1c was 6.1. She has been working on intensive lifestyle modifications including diet, exercise, and weight loss to help control her blood glucose levels.  Hypertension Crystal Roberts is a 62 y.o. female with hypertension.  Crystal Roberts denies chest pain, lightheadedness, or shortness of breath on exertion. She is working weight loss to help control her blood pressure with the goal of decreasing her risk of heart attack and stroke. [name] blood pressure is currently controlled.  Depression with emotional eating behaviors Crystal Roberts is struggling with emotional eating and using food for comfort to the extent that it is negatively impacting her health. She often snacks when she is not hungry. Crystal Roberts sometimes feels she is out of control and then feels guilty that she made poor food choices. She has been working on behavior modification techniques to help reduce her emotional eating and has been somewhat successful. She increased Wellbutrin SR to 200 mg qd at last visit. She shows no sign of suicidal or homicidal ideations.  Depression screen Crystal Roberts 2/9 09/10/2017 07/11/2017  Decreased Interest 2 0  Down, Depressed,  Hopeless 1 0  PHQ - 2 Score 3 0  Altered sleeping 1 -  Tired, decreased energy 2 -  Change in appetite 1 -  Feeling bad or failure about yourself  1 -  Trouble concentrating 1 -  Moving slowly or fidgety/restless 0 -  Suicidal thoughts 0 -  PHQ-9 Score 9 -  Difficult doing work/chores Not difficult at all -     ALLERGIES: Allergies  Allergen Reactions  . Sulfa Antibiotics Itching and Rash    MEDICATIONS: Current Outpatient Medications on File Prior to Visit  Medication Sig Dispense Refill  . aspirin 81 MG tablet Take 81 mg by mouth daily.    Marland Kitchen CINNAMON PO Take 1 tablet by mouth at bedtime.    Marland Kitchen levothyroxine (SYNTHROID, LEVOTHROID) 75 MCG tablet Take 75 mcg by mouth daily before breakfast.    . losartan-hydrochlorothiazide (HYZAAR) 100-25 MG per tablet Take 1 tablet by mouth daily.    . meloxicam (MOBIC) 15 MG tablet Take 15 mg by mouth daily.    . metFORMIN (GLUCOPHAGE-XR) 750 MG 24 hr tablet Take 750 mg by mouth 2 (two) times daily with a meal.    . Multiple Vitamin (MULTIVITAMIN) tablet Take 1 tablet by mouth daily.    . simvastatin (ZOCOR) 40 MG tablet Take 40 mg by mouth at bedtime.      No current facility-administered medications on file prior to visit.     PAST MEDICAL HISTORY: Past Medical History:  Diagnosis Date  . Anxiety   .  Arthritis    neck- cervical disc herniation   . Asthma   . Back pain   . Cancer ([place])    skin - low back , insitu  melanoma   . Constipation   . Depression   . Diabetes mellitus without complication ([place])   . Dyspnea   . Gallbladder problem   . GERD (gastroesophageal reflux disease)   . H/O cardiovascular stress test 1990   pt. reports that she was having a lot of stress & she was seen by cardiac- had stress test & told that it was negative   . Hyperlipidemia   . Hypertension   . Hypothyroidism   . Leg edema   . Pulmonary embolism ([place]) Crystal Roberts. at [facility]- on blood thinning med., thought to be related to lack of activity,  smoking & BCP    PAST SURGICAL HISTORY: Past Surgical History:  Procedure Laterality Date  . ANTERIOR CERVICAL DECOMP/DISCECTOMY FUSION N/A 08/16/2016   Procedure: Cervical Four-Five Anterior cervical decompression/discectomy/fusion;  Surgeon: Erline Levine, MD;  Location: [facility];  Service: Neurosurgery;  Laterality: N/A;  C4-5 Anterior cervical decompression/discectomy/fusion  . BACK SURGERY     sebaceous cyst removed  . CHOLECYSTECTOMY  2725   umbilical hernia ------ followed cholecystectomy  . CYSTECTOMY Bilateral    cyst on ovaries  . HERNIA REPAIR     umbilical hernia repair   . TUBAL LIGATION      SOCIAL HISTORY: Social History   Tobacco Use  . Smoking status: Former Smoker    Packs/day: 1.00    Years: 10.00    Pack years: 10.00    Types: Cigarettes    Last attempt to quit: 08/14/1986    Years since quitting: 31.2  . Smokeless tobacco: Never Used  Substance Use Topics  . Alcohol use: Yes    Comment: very rare   . Drug use: No    FAMILY HISTORY: Family History  Problem Relation Age of Onset  . Heart disease Mother   . Hypertension Mother   . Hyperlipidemia Mother   . Cancer Father        melenoma  . Diabetes Sister   . Stroke Brother   . Heart disease Brother   . Diabetes Paternal Grandmother   . Diabetes Sister   . Cancer Sister        kidney; had kidney removed    ROS: Review of Systems  Constitutional: Positive for weight loss.  Respiratory: Negative for shortness of breath.   Cardiovascular: Negative for chest pain.  Gastrointestinal: Negative for nausea and vomiting.  Musculoskeletal:       Negative for muscle weakness  Neurological:       Negative for lightheadedness  Endo/Heme/Allergies:       Negative for hypoglycemia  Psychiatric/Behavioral: Positive for depression. Negative for suicidal ideas.       Negative for homicidal ideations     PHYSICAL EXAM: Blood pressure 132/72, pulse 64, temperature 98.2 F (36.8 C), temperature source  Oral, height 5\' 4"  (1.626 m), weight 215 lb (97.5 kg), SpO2 97 %. Body mass index is 36.9 kg/m. Physical Exam  Constitutional: She is oriented to person, place, and time. She appears well-developed and well-nourished.  HENT:  Head: Normocephalic.  Neck: Normal range of motion.  Cardiovascular: Normal rate.  Pulmonary/Chest: Effort normal.  Musculoskeletal: Normal range of motion.  Neurological: She is alert and oriented to person, place, and time.  Skin: Skin is warm and dry.  Psychiatric: She has a  normal mood and affect. Her behavior is normal.  Vitals reviewed.   RECENT LABS AND TESTS: BMET    Component Value Date/Time   NA 140 09/10/2017 0923   K 4.5 09/10/2017 0923   CL 100 09/10/2017 0923   CO2 22 09/10/2017 0923   GLUCOSE 100 (H) 09/10/2017 0923   GLUCOSE 87 08/13/2016 1558   BUN 11 09/10/2017 0923   CREATININE 0.81 09/10/2017 0923   CALCIUM 9.6 09/10/2017 0923   GFRNONAA 80 09/10/2017 0923   GFRAA 92 09/10/2017 0923   Lab Results  Component Value Date   HGBA1C 6.1 (H) 09/10/2017   HGBA1C 5.8 (H) 08/13/2016   Lab Results  Component Value Date   INSULIN 27.5 (H) 09/10/2017   CBC    Component Value Date/Time   WBC 5.6 09/10/2017 0923   WBC 7.0 08/13/2016 1558   RBC 4.42 09/10/2017 0923   RBC 4.39 08/13/2016 1558   HGB 13.1 09/10/2017 0923   HCT 39.2 09/10/2017 0923   PLT 370 08/13/2016 1558   MCV 89 09/10/2017 0923   MCH 29.6 09/10/2017 0923   MCH 29.6 08/13/2016 1558   MCHC 33.4 09/10/2017 0923   MCHC 32.8 08/13/2016 1558   RDW 13.2 09/10/2017 0923   LYMPHSABS 1.4 09/10/2017 0923   EOSABS 0.3 09/10/2017 0923   BASOSABS 0.0 09/10/2017 0923   Iron/TIBC/Ferritin/ %Sat No results found for: IRON, TIBC, FERRITIN, IRONPCTSAT Lipid Panel     Component Value Date/Time   CHOL 171 09/10/2017 0923   TRIG 105 09/10/2017 0923   HDL 57 09/10/2017 0923   LDLCALC 93 09/10/2017 0923   Hepatic Function Panel     Component Value Date/Time   PROT 7.0  09/10/2017 0923   ALBUMIN 4.4 09/10/2017 0923   AST 25 09/10/2017 0923   ALT 26 09/10/2017 0923   ALKPHOS 82 09/10/2017 0923   BILITOT 0.3 09/10/2017 0923      Component Value Date/Time   TSH 3.390 09/10/2017 0923    ASSESSMENT AND PLAN: Type 2 diabetes mellitus without complication, without long-term current use of insulin (HCC)  Essential hypertension  Other depression - with emotional eating  Class 2 severe obesity with serious comorbidity and body mass index (BMI) of 37.0 to 37.9 in adult, unspecified obesity type ([place])  PLAN:     Obesity Crystal Roberts is currently in the action stage of change. As such, her goal is to continue with weight loss efforts She has agreed to follow the Category 2 plan Crystal Roberts has been instructed to work up to a goal of 150 minutes of combined cardio and strengthening exercise per week for weight loss and overall health benefits. We discussed the following Behavioral Modification Strategies today: increasing lean protein intake, decreasing simple carbohydrates , increasing vegetables, decrease eating out, no skipping meals, keeping healthy foods in the home, increasing water intake,  and work on meal planning and easy cooking plans.    Crystal Roberts has agreed to follow up with our clinic in 3 weeks. She was informed of the importance of frequent follow up visits to maximize her success with intensive lifestyle modifications for her multiple health conditions.   OBESITY BEHAVIORAL INTERVENTION VISIT  Today's visit was # 5   Starting weight: 241 lb Starting date: 09/10/17 Today's weight : 215 lb Today's date: 11/12/17 Total lbs lost to date: 26 lb    ASK: We discussed the diagnosis of obesity with Crystal Roberts today and Crystal Roberts agreed to give [place] permission to discuss obesity behavioral modification therapy  today.  ASSESS: Crystal Roberts has the diagnosis of obesity and her BMI today is 37.61 Crystal Roberts is in the action stage of change   ADVISE: Crystal Roberts was  educated on the multiple health risks of obesity as well as the benefit of weight loss to improve her health. She was advised of the need for long term treatment and the importance of lifestyle modifications to improve her current health and to decrease her risk of future health problems.  AGREE: Multiple dietary modification options and treatment options were discussed and  Crystal Roberts agreed to follow the recommendations documented in the above note.  ARRANGE: Crystal Roberts was educated on the importance of frequent visits to treat obesity as outlined per CMS and USPSTF guidelines and agreed to schedule her next follow up appointment today.  Leary Roca, am acting as transcriptionist for [employer], DO   I have reviewed the above documentation for accuracy and completeness, and I agree with the above. -Jearld Lesch, DO

## 2017-11-17 ENCOUNTER — Encounter (INDEPENDENT_AMBULATORY_CARE_PROVIDER_SITE_OTHER): Payer: Self-pay | Admitting: Bariatrics

## 2017-11-17 NOTE — Patient Instructions (Signed)
Crystal Roberts  11/17/2017     @PREFPERIOPPHARMACY @   Your procedure is scheduled on  11/21/2017   Report to [facility] at  36   A.M.  Call this number if you have problems the morning of surgery:  [phone]   Remember:  Do not eat or drink after midnight.                      Take these medicines the morning of surgery with A SIP OF WATER  Wellbutrin, levothyroxine, losartan, mobic (if needed).    Do not wear jewelry, make-up or nail polish.  Do not wear lotions, powders, or perfumes, or deodorant.  Do not shave 48 hours prior to surgery.  Men may shave face and neck.  Do not bring valuables to the hospital.  [facility] is not responsible for any belongings or valuables.  Contacts, dentures or bridgework may not be worn into surgery.  Leave your suitcase in the car.  After surgery it may be brought to your room.  For patients admitted to the hospital, discharge time will be determined by your treatment team.  Patients discharged the day of surgery will not be allowed to drive home.   Name and phone number of your driver:   family Special instructions:  None  Please read over the following fact sheets that you were given. Anesthesia Post-op Instructions and Care and Recovery After Surgery      Epidermal Cyst Removal Epidermal cyst removal is a procedure to remove a sac of oily material that forms under your skin (epidermal cyst). Epidermal cysts may also be called epidermoid cysts or keratin cysts. Normally, the skin secretes this oily material through a gland or a hair follicle. This type of cyst usually results when a skin gland or hair follicle becomes blocked. You may need this procedure if you have an epidermal cyst that becomes large, uncomfortable, or infected. Tell a health care provider about:  Any allergies you have.  All medicines you are taking, including vitamins, herbs, eye drops, creams, and over-the-counter medicines.  Any problems  you or family members have had with anesthetic medicines.  Any blood disorders you have.  Any surgeries you have had.  Any medical conditions you have. What are the risks? Generally, this is a safe procedure. However, problems may occur, including:  Developing another cyst.  Bleeding.  Infection.  Scarring.  What happens before the procedure?  Ask your health care provider about: ? Changing or stopping your regular medicines. This is especially important if you are taking diabetes medicines or blood thinners. ? Taking medicines such as aspirin and ibuprofen. These medicines can thin your blood. Do not take these medicines before your procedure if your health care provider instructs you not to.  If you have an infected cyst, you may have to take antibiotic medicines before or after the cyst removal. Take your antibiotics as directed by your health care provider. Finish all of the medicine even if you start to feel better.  Take a shower on the morning of your procedure. Your health care provider may ask you to use a germ-killing (antiseptic) soap. What happens during the procedure?  You will be given a medicine that numbs the area (local anesthetic).  The skin around the cyst will be cleaned with a germ-killing solution (antiseptic).  Your health care provider will make a small surgical incision over the cyst.  The cyst will be separated from the surrounding tissues that are under your skin.  If possible, the cyst will be removed undamaged (intact).  If the cyst bursts (ruptures), it will need to be removed in pieces.  After the cyst is removed, your health care provider will control any bleeding and close the incision with small stitches (sutures). Small incisions may not need sutures, and the bleeding will be controlled by applying direct pressure with gauze.  Your health care provider may apply antibiotic ointment and a light bandage (dressing) over the incision. This  procedure may vary among health care providers and hospitals. What happens after the procedure?  If your cyst ruptured during surgery, you may need to take antibiotic medicine. If you were prescribed an antibiotic medicine, finish all of it even if you start to feel better. This information is not intended to replace advice given to you by your health care provider. Make sure you discuss any questions you have with your health care provider. Document Released: 12/29/1999 Document Revised: 06/08/2015 Document Reviewed: 09/15/2013 Elsevier Interactive Patient Education  2018 [place]. Epidermal Cyst Removal, Care After Refer to this sheet in the next few weeks. These instructions provide you with information about caring for yourself after your procedure. Your health care provider may also give you more specific instructions. Your treatment has been planned according to current medical practices, but problems sometimes occur. Call your health care provider if you have any problems or questions after your procedure. What can I expect after the procedure? After the procedure, it is common to have:  Soreness in the area where your cyst was removed.  Tightness or itching from your skin sutures.  Follow these instructions at home:  Take medicines only as directed by your health care provider.  If you were prescribed an antibiotic medicine, finish all of it even if you start to feel better.  Use antibiotic ointment as directed by your health care provider. Follow the instructions carefully.  There are many different ways to close and cover an incision, including stitches (sutures), skin glue, and adhesive strips. Follow your health care provider's instructions about: ? Incision care. ? Bandage (dressing) changes and removal. ? Incision closure removal.  Keep the bandage (dressing) dry until your health care provider says that it can be removed. Take sponge baths only. Ask your health care  provider when you can start showering or taking a bath.  After your dressing is off, check your incision every day for signs of infection. Watch for: ? Redness, swelling, or pain. ? Fluid, blood, or pus.  You can return to your normal activities. Do not do anything that stretches or puts pressure on your incision.  You can return to your normal diet.  Keep all follow-up visits as directed by your health care provider. This is important. Contact a health care provider if:  You have a fever.  Your incision bleeds.  You have redness, swelling, or pain in the incision area.  You have fluid, blood, or pus coming from your incision.  Your cyst comes back after surgery. This information is not intended to replace advice given to you by your health care provider. Make sure you discuss any questions you have with your health care provider. Document Released: 01/21/2014 Document Revised: 06/08/2015 Document Reviewed: 09/15/2013 Elsevier Interactive Patient Education  2018 [place] Anesthesia, Adult General anesthesia is the use of medicines to make a person "go to sleep" (be unconscious) for a medical procedure.  General anesthesia is often recommended when a procedure:  Is long.  Requires you to be still or in an unusual position.  Is major and can cause you to lose blood.  Is impossible to do without general anesthesia.  The medicines used for general anesthesia are called general anesthetics. In addition to making you sleep, the medicines:  Prevent pain.  Control your blood pressure.  Relax your muscles.  Tell a health care provider about:  Any allergies you have.  All medicines you are taking, including vitamins, herbs, eye drops, creams, and over-the-counter medicines.  Any problems you or family members have had with anesthetic medicines.  Types of anesthetics you have had in the past.  Any bleeding disorders you have.  Any surgeries you have  had.  Any medical conditions you have.  Any history of heart or lung conditions, such as heart failure, sleep apnea, or chronic obstructive pulmonary disease (COPD).  Whether you are pregnant or may be pregnant.  Whether you use tobacco, alcohol, marijuana, or street drugs.  Any history of Armed forces logistics/support/administrative officer.  Any history of depression or anxiety. What are the risks? Generally, this is a safe procedure. However, problems may occur, including:  Allergic reaction to anesthetics.  Lung and heart problems.  Inhaling food or liquids from your stomach into your lungs (aspiration).  Injury to nerves.  Waking up during your procedure and being unable to move (rare).  Extreme agitation or a state of mental confusion (delirium) when you wake up from the anesthetic.  Air in the bloodstream, which can lead to stroke.  These problems are more likely to develop if you are having a major surgery or if you have an advanced medical condition. You can prevent some of these complications by answering all of your health care provider's questions thoroughly and by following all pre-procedure instructions. General anesthesia can cause side effects, including:  Nausea or vomiting  A sore throat from the breathing tube.  Feeling cold or shivery.  Feeling tired, washed out, or achy.  Sleepiness or drowsiness.  Confusion or agitation.  What happens before the procedure? Staying hydrated Follow instructions from your health care provider about hydration, which may include:  Up to 2 hours before the procedure - you may continue to drink clear liquids, such as water, clear fruit juice, black coffee, and plain tea.  Eating and drinking restrictions Follow instructions from your health care provider about eating and drinking, which may include:  8 hours before the procedure - stop eating heavy meals or foods such as meat, fried foods, or fatty foods.  6 hours before the procedure - stop eating  light meals or foods, such as toast or cereal.  6 hours before the procedure - stop drinking milk or drinks that contain milk.  2 hours before the procedure - stop drinking clear liquids.  Medicines  Ask your health care provider about: ? Changing or stopping your regular medicines. This is especially important if you are taking diabetes medicines or blood thinners. ? Taking medicines such as aspirin and ibuprofen. These medicines can thin your blood. Do not take these medicines before your procedure if your health care provider instructs you not to. ? Taking new dietary supplements or medicines. Do not take these during the week before your procedure unless your health care provider approves them.  If you are told to take a medicine or to continue taking a medicine on the day of the procedure, take the medicine with sips of water.  General instructions   Ask if you will be going home the same day, the following day, or after a longer hospital stay. ? Plan to have someone take you home. ? Plan to have someone stay with you for the first 24 hours after you leave the hospital or clinic.  For 3-6 weeks before the procedure, try not to use any tobacco products, such as cigarettes, chewing tobacco, and e-cigarettes.  You may brush your teeth on the morning of the procedure, but make sure to spit out the toothpaste. What happens during the procedure?  You will be given anesthetics through a mask and through an IV tube in one of your veins.  You may receive medicine to help you relax (sedative).  As soon as you are asleep, a breathing tube may be used to help you breathe.  An anesthesia specialist will stay with you throughout the procedure. He or she will help keep you comfortable and safe by continuing to give you medicines and adjusting the amount of medicine that you get. He or she will also watch your blood pressure, pulse, and oxygen levels to make sure that the anesthetics do not cause  any problems.  If a breathing tube was used to help you breathe, it will be removed before you wake up. The procedure may vary among health care providers and hospitals. What happens after the procedure?  You will wake up, often slowly, after the procedure is complete, usually in a recovery area.  Your blood pressure, heart rate, breathing rate, and blood oxygen level will be monitored until the medicines you were given have worn off.  You may be given medicine to help you calm down if you feel anxious or agitated.  If you will be going home the same day, your health care provider may check to make sure you can stand, drink, and urinate.  Your health care providers will treat your pain and side effects before you go home.  Do not drive for 24 hours if you received a sedative.  You may: ? Feel nauseous and vomit. ? Have a sore throat. ? Have mental slowness. ? Feel cold or shivery. ? Feel sleepy. ? Feel tired. ? Feel sore or achy, even in parts of your body where you did not have surgery. This information is not intended to replace advice given to you by your health care provider. Make sure you discuss any questions you have with your health care provider. Document Released: 04/09/2007 Document Revised: 06/13/2015 Document Reviewed: 12/15/2014 Elsevier Interactive Patient Education  2018 [place] Anesthesia, Adult, Care After These instructions provide you with information about caring for yourself after your procedure. Your health care provider may also give you more specific instructions. Your treatment has been planned according to current medical practices, but problems sometimes occur. Call your health care provider if you have any problems or questions after your procedure. What can I expect after the procedure? After the procedure, it is common to have:  Vomiting.  A sore throat.  Mental slowness.  It is common to feel:  Nauseous.  Cold or  shivery.  Sleepy.  Tired.  Sore or achy, even in parts of your body where you did not have surgery.  Follow these instructions at home: For at least 24 hours after the procedure:  Do not: ? Participate in activities where you could fall or become injured. ? Drive. ? Use heavy machinery. ? Drink alcohol. ? Take sleeping pills or medicines that cause  drowsiness. ? Make important decisions or sign legal documents. ? Take care of children on your own.  Rest. Eating and drinking  If you vomit, drink water, juice, or soup when you can drink without vomiting.  Drink enough fluid to keep your urine clear or pale yellow.  Make sure you have little or no nausea before eating solid foods.  Follow the diet recommended by your health care provider. General instructions  Have a responsible adult stay with you until you are awake and alert.  Return to your normal activities as told by your health care provider. Ask your health care provider what activities are safe for you.  Take over-the-counter and prescription medicines only as told by your health care provider.  If you smoke, do not smoke without supervision.  Keep all follow-up visits as told by your health care provider. This is important. Contact a health care provider if:  You continue to have nausea or vomiting at home, and medicines are not helpful.  You cannot drink fluids or start eating again.  You cannot urinate after 8-12 hours.  You develop a skin rash.  You have fever.  You have increasing redness at the site of your procedure. Get help right away if:  You have difficulty breathing.  You have chest pain.  You have unexpected bleeding.  You feel that you are having a life-threatening or urgent problem. This information is not intended to replace advice given to you by your health care provider. Make sure you discuss any questions you have with your health care provider. Document Released: 04/08/2000  Document Revised: 06/05/2015 Document Reviewed: 12/15/2014 Elsevier Interactive Patient Education  [employer].

## 2017-11-18 ENCOUNTER — Encounter (HOSPITAL_COMMUNITY)
Admission: RE | Admit: 2017-11-18 | Discharge: 2017-11-18 | Disposition: A | Discharge status: Home / Self Care | Payer: 59 | Source: Ambulatory Visit | Attending: General Surgery | Admitting: General Surgery

## 2017-11-18 ENCOUNTER — Other Ambulatory Visit: Payer: Self-pay

## 2017-11-18 ENCOUNTER — Encounter (HOSPITAL_COMMUNITY): Payer: Self-pay

## 2017-11-18 DIAGNOSIS — Z01812 Encounter for preprocedural laboratory examination: Secondary | ICD-10-CM | POA: Insufficient documentation

## 2017-11-18 DIAGNOSIS — E119 Type 2 diabetes mellitus without complications: Secondary | ICD-10-CM | POA: Diagnosis not present

## 2017-11-18 LAB — GLUCOSE, CAPILLARY: GLUCOSE-CAPILLARY: 98 mg/dL (ref 70–99)

## 2017-11-18 LAB — CBC WITH DIFFERENTIAL/PLATELET
Abs Immature Granulocytes: 0.02 10*3/uL (ref 0.00–0.07)
Basophils Absolute: 0.1 10*3/uL (ref 0.0–0.1)
Basophils Relative: 1 %
EOS ABS: 0.2 10*3/uL (ref 0.0–0.5)
EOS PCT: 3 %
HEMATOCRIT: 43.8 % (ref 36.0–46.0)
HEMOGLOBIN: 13.9 g/dL (ref 12.0–15.0)
IMMATURE GRANULOCYTES: 0 %
LYMPHS PCT: 23 %
Lymphs Abs: 1.9 10*3/uL (ref 0.7–4.0)
MCH: 29.8 pg (ref 26.0–34.0)
MCHC: 31.7 g/dL (ref 30.0–36.0)
MCV: 93.8 fL (ref 80.0–100.0)
MONOS PCT: 9 %
Monocytes Absolute: 0.7 10*3/uL (ref 0.1–1.0)
Neutro Abs: 5.6 10*3/uL (ref 1.7–7.7)
Neutrophils Relative %: 64 %
Platelets: 361 10*3/uL (ref 150–400)
RBC: 4.67 MIL/uL (ref 3.87–5.11)
RDW: 13.5 % (ref 11.5–15.5)
WBC: 8.6 10*3/uL (ref 4.0–10.5)
nRBC: 0 % (ref 0.0–0.2)

## 2017-11-18 LAB — BASIC METABOLIC PANEL
Anion gap: 9 (ref 5–15)
BUN: 21 mg/dL — ABNORMAL HIGH (ref 6–20)
CALCIUM: 9.6 mg/dL (ref 8.9–10.3)
CHLORIDE: 104 mmol/L (ref 98–111)
CO2: 24 mmol/L (ref 22–32)
CREATININE: 0.87 mg/dL (ref 0.44–1.00)
GFR calc Af Amer: >60 mL/min (ref >60)
GFR calc non Af Amer: >60 mL/min (ref >60)
GLUCOSE: 89 mg/dL (ref 70–99)
Potassium: 4.1 mmol/L (ref 3.5–5.1)
Sodium: 137 mmol/L (ref 135–145)

## 2017-11-18 LAB — HEMOGLOBIN A1C
Hgb A1c MFr Bld: 6 % — ABNORMAL HIGH (ref 4.8–5.6)
MEAN PLASMA GLUCOSE: 125.5 mg/dL

## 2017-11-19 MED FILL — LOSARTAN-HCTZ 100-25 MG TAB: 100-25 | 90 days supply | Qty: 90 | Fill #0

## 2017-11-19 NOTE — Pre-Procedure Instructions (Signed)
HgbA1C routed to PCP. 

## 2017-11-21 ENCOUNTER — Ambulatory Visit (HOSPITAL_COMMUNITY): Payer: 59 | Admitting: Anesthesiology

## 2017-11-21 ENCOUNTER — Ambulatory Visit (HOSPITAL_COMMUNITY)
Admission: RE | Admit: 2017-11-21 | Discharge: 2017-11-21 | Disposition: A | Discharge status: Home / Self Care | Payer: 59 | Source: Ambulatory Visit | Attending: General Surgery | Admitting: General Surgery

## 2017-11-21 ENCOUNTER — Encounter (HOSPITAL_COMMUNITY)
Admission: RE | Disposition: A | Discharge status: Home / Self Care | Payer: Self-pay | Source: Ambulatory Visit | Attending: General Surgery

## 2017-11-21 ENCOUNTER — Encounter (HOSPITAL_COMMUNITY): Payer: Self-pay | Admitting: *Deleted

## 2017-11-21 DIAGNOSIS — K219 Gastro-esophageal reflux disease without esophagitis: Secondary | ICD-10-CM | POA: Diagnosis not present

## 2017-11-21 DIAGNOSIS — F419 Anxiety disorder, unspecified: Secondary | ICD-10-CM | POA: Insufficient documentation

## 2017-11-21 DIAGNOSIS — Z7984 Long term (current) use of oral hypoglycemic drugs: Secondary | ICD-10-CM | POA: Insufficient documentation

## 2017-11-21 DIAGNOSIS — E119 Type 2 diabetes mellitus without complications: Secondary | ICD-10-CM | POA: Diagnosis not present

## 2017-11-21 DIAGNOSIS — E785 Hyperlipidemia, unspecified: Secondary | ICD-10-CM | POA: Diagnosis not present

## 2017-11-21 DIAGNOSIS — Z7982 Long term (current) use of aspirin: Secondary | ICD-10-CM | POA: Diagnosis not present

## 2017-11-21 DIAGNOSIS — I10 Essential (primary) hypertension: Secondary | ICD-10-CM | POA: Insufficient documentation

## 2017-11-21 DIAGNOSIS — F329 Major depressive disorder, single episode, unspecified: Secondary | ICD-10-CM | POA: Insufficient documentation

## 2017-11-21 DIAGNOSIS — F17210 Nicotine dependence, cigarettes, uncomplicated: Secondary | ICD-10-CM | POA: Diagnosis not present

## 2017-11-21 DIAGNOSIS — J45909 Unspecified asthma, uncomplicated: Secondary | ICD-10-CM | POA: Insufficient documentation

## 2017-11-21 DIAGNOSIS — M1990 Unspecified osteoarthritis, unspecified site: Secondary | ICD-10-CM | POA: Insufficient documentation

## 2017-11-21 DIAGNOSIS — Z86711 Personal history of pulmonary embolism: Secondary | ICD-10-CM | POA: Diagnosis not present

## 2017-11-21 DIAGNOSIS — Z79899 Other long term (current) drug therapy: Secondary | ICD-10-CM | POA: Diagnosis not present

## 2017-11-21 DIAGNOSIS — E039 Hypothyroidism, unspecified: Secondary | ICD-10-CM | POA: Diagnosis not present

## 2017-11-21 DIAGNOSIS — Z9049 Acquired absence of other specified parts of digestive tract: Secondary | ICD-10-CM | POA: Insufficient documentation

## 2017-11-21 DIAGNOSIS — Z882 Allergy status to sulfonamides status: Secondary | ICD-10-CM | POA: Insufficient documentation

## 2017-11-21 DIAGNOSIS — L723 Sebaceous cyst: Secondary | ICD-10-CM

## 2017-11-21 DIAGNOSIS — Z85820 Personal history of malignant melanoma of skin: Secondary | ICD-10-CM | POA: Insufficient documentation

## 2017-11-21 HISTORY — PX: MASS EXCISION: SHX2000

## 2017-11-21 LAB — GLUCOSE, CAPILLARY
GLUCOSE-CAPILLARY: 90 mg/dL (ref 70–99)
Glucose-Capillary: 97 mg/dL (ref 70–99)

## 2017-11-21 SURGERY — EXCISION MASS
Anesthesia: General | Site: Back

## 2017-11-21 MED ORDER — FENTANYL CITRATE (PF) 100 MCG/2ML IJ SOLN
INTRAMUSCULAR | Status: AC
  Filled 2017-11-21: qty 2

## 2017-11-21 MED ORDER — HYDROCODONE-ACETAMINOPHEN 7.5-325 MG PO TABS: 1.0000 | ORAL_TABLET | Freq: Four times a day (QID) | ORAL | 0 refills | Status: DC | PRN

## 2017-11-21 MED ORDER — CEFAZOLIN SODIUM-DEXTROSE 2-4 GM/100ML-% IV SOLN
2.0000 g | INTRAVENOUS | Status: AC
  Administered 2017-11-21: 2 g via INTRAVENOUS

## 2017-11-21 MED ORDER — MIDAZOLAM HCL 5 MG/5ML IJ SOLN
INTRAMUSCULAR | Status: DC | PRN
  Administered 2017-11-21: 2 mg via INTRAVENOUS

## 2017-11-21 MED ORDER — 0.9 % SODIUM CHLORIDE (POUR BTL) OPTIME
TOPICAL | Status: DC | PRN
  Administered 2017-11-21: 1000 mL

## 2017-11-21 MED ORDER — POVIDONE-IODINE 10 % EX OINT
TOPICAL_OINTMENT | CUTANEOUS | Status: AC
  Filled 2017-11-21: qty 1

## 2017-11-21 MED ORDER — CEFAZOLIN SODIUM-DEXTROSE 2-4 GM/100ML-% IV SOLN
INTRAVENOUS | Status: AC
  Filled 2017-11-21: qty 100

## 2017-11-21 MED ORDER — MIDAZOLAM HCL 2 MG/2ML IJ SOLN
INTRAMUSCULAR | Status: AC
  Filled 2017-11-21: qty 2

## 2017-11-21 MED ORDER — LIDOCAINE 2% (20 MG/ML) 5 ML SYRINGE
INTRAMUSCULAR | Status: AC
  Filled 2017-11-21: qty 5

## 2017-11-21 MED ORDER — CHLORHEXIDINE GLUCONATE CLOTH 2 % EX PADS: 6.0000 | MEDICATED_PAD | Freq: Once | CUTANEOUS | Status: DC

## 2017-11-21 MED ORDER — FENTANYL CITRATE (PF) 100 MCG/2ML IJ SOLN
INTRAMUSCULAR | Status: DC | PRN
  Administered 2017-11-21: 50 ug via INTRAVENOUS

## 2017-11-21 MED ORDER — MEPERIDINE HCL 50 MG/ML IJ SOLN: 6.2500 mg | INTRAMUSCULAR | Status: DC | PRN

## 2017-11-21 MED ORDER — BUPIVACAINE HCL (PF) 0.5 % IJ SOLN
INTRAMUSCULAR | Status: AC
  Filled 2017-11-21: qty 30

## 2017-11-21 MED ORDER — LACTATED RINGERS IV SOLN
INTRAVENOUS | Status: DC
  Administered 2017-11-21: 1000 mL via INTRAVENOUS
  Administered 2017-11-21: 11:00:00 via INTRAVENOUS

## 2017-11-21 MED ORDER — PROPOFOL 10 MG/ML IV BOLUS
INTRAVENOUS | Status: DC | PRN
  Administered 2017-11-21: 100 mg via INTRAVENOUS

## 2017-11-21 MED ORDER — KETOROLAC TROMETHAMINE 30 MG/ML IJ SOLN: 30.0000 mg | Freq: Once | INTRAMUSCULAR | Status: DC

## 2017-11-21 MED ORDER — HYDROCODONE-ACETAMINOPHEN 7.5-325 MG PO TABS: 1.0000 | ORAL_TABLET | Freq: Once | ORAL | Status: DC | PRN

## 2017-11-21 MED ORDER — HYDROMORPHONE HCL 1 MG/ML IJ SOLN: 0.2500 mg | INTRAMUSCULAR | Status: DC | PRN

## 2017-11-21 MED ORDER — BUPIVACAINE HCL (PF) 0.5 % IJ SOLN
INTRAMUSCULAR | Status: DC | PRN
  Administered 2017-11-21: 5 mL

## 2017-11-21 MED ORDER — PROPOFOL 10 MG/ML IV BOLUS
INTRAVENOUS | Status: AC
  Filled 2017-11-21: qty 40

## 2017-11-21 MED ORDER — POVIDONE-IODINE 10 % EX OINT
TOPICAL_OINTMENT | CUTANEOUS | Status: DC | PRN
  Administered 2017-11-21: 1 via TOPICAL

## 2017-11-21 MED ORDER — ONDANSETRON HCL 4 MG/2ML IJ SOLN: 4.0000 mg | Freq: Once | INTRAMUSCULAR | Status: DC | PRN

## 2017-11-21 MED ORDER — KETOROLAC TROMETHAMINE 30 MG/ML IJ SOLN: 30.0000 mg | Freq: Once | INTRAMUSCULAR | Status: DC | PRN

## 2017-11-21 MED ORDER — LIDOCAINE HCL 1 % IJ SOLN
INTRAMUSCULAR | Status: DC | PRN
  Administered 2017-11-21: 40 mg via INTRADERMAL

## 2017-11-21 MED FILL — HYDROCODON-APAP 7.5-325: 7.5-325 | 7 days supply | Qty: 25 | Fill #0

## 2017-11-21 SURGICAL SUPPLY — 33 items
ADH SKN CLS APL DERMABOND .7 (GAUZE/BANDAGES/DRESSINGS)
CHLORAPREP W/TINT 10.5 ML (MISCELLANEOUS) ×2 IMPLANT
CLOTH BEACON ORANGE TIMEOUT ST (SAFETY) ×2 IMPLANT
COVER LIGHT HANDLE STERIS (MISCELLANEOUS) ×4 IMPLANT
DECANTER SPIKE VIAL GLASS SM (MISCELLANEOUS) ×2 IMPLANT
DERMABOND ADVANCED (GAUZE/BANDAGES/DRESSINGS)
DERMABOND ADVANCED .7 DNX12 (GAUZE/BANDAGES/DRESSINGS) IMPLANT
ELECT NDL TIP 2.8 STRL (NEEDLE) IMPLANT
ELECT NEEDLE TIP 2.8 STRL (NEEDLE) ×2 IMPLANT
ELECT REM PT RETURN 9FT ADLT (ELECTROSURGICAL) ×2
ELECTRODE REM PT RTRN 9FT ADLT (ELECTROSURGICAL) ×1 IMPLANT
GAUZE SPONGE 4X4 12PLY STRL (GAUZE/BANDAGES/DRESSINGS) ×2 IMPLANT
GLOVE BIOGEL PI IND STRL 7.0 (GLOVE) ×1 IMPLANT
GLOVE BIOGEL PI INDICATOR 7.0 (GLOVE) ×1
GLOVE ECLIPSE 6.5 STRL STRAW (GLOVE) ×1 IMPLANT
GLOVE SURG SS PI 7.5 STRL IVOR (GLOVE) ×2 IMPLANT
GOWN STRL REUS W/ TWL XL LVL3 (GOWN DISPOSABLE) ×1 IMPLANT
GOWN STRL REUS W/TWL LRG LVL3 (GOWN DISPOSABLE) ×2 IMPLANT
GOWN STRL REUS W/TWL XL LVL3 (GOWN DISPOSABLE) ×2
KIT TURNOVER KIT A (KITS) ×2 IMPLANT
MANIFOLD NEPTUNE II (INSTRUMENTS) ×2 IMPLANT
NEEDLE HYPO 25X1 1.5 SAFETY (NEEDLE) ×2 IMPLANT
NS IRRIG 1000ML POUR BTL (IV SOLUTION) ×2 IMPLANT
PACK MINOR (CUSTOM PROCEDURE TRAY) ×2 IMPLANT
PAD ARMBOARD 7.5X6 YLW CONV (MISCELLANEOUS) ×2 IMPLANT
SET BASIN LINEN APH (SET/KITS/TRAYS/PACK) ×2 IMPLANT
SUT ETHILON 3 0 FSL (SUTURE) IMPLANT
SUT MNCRL AB 4-0 PS2 18 (SUTURE) IMPLANT
SUT PROLENE 4 0 PS 2 18 (SUTURE) IMPLANT
SUT VIC AB 3-0 SH 27 (SUTURE)
SUT VIC AB 3-0 SH 27X BRD (SUTURE) IMPLANT
SYR CONTROL 10ML LL (SYRINGE) ×2 IMPLANT
TAPE PAPER 2X10 WHT MICROPORE (GAUZE/BANDAGES/DRESSINGS) ×1 IMPLANT

## 2017-11-21 NOTE — Interval H&P Note (Signed)
History and Physical Interval Note:  11/21/2017 10:56 AM  Crystal Roberts  has presented today for surgery, with the diagnosis of 3cm sebaceous cyst on back  The various methods of treatment have been discussed with the patient and family. After consideration of risks, benefits and other options for treatment, the patient has consented to  Procedure(s): EXCISION SEBACEOUS CYST ON BACK (N/A) as a surgical intervention .  The patient's history has been reviewed, patient examined, no change in status, stable for surgery.  I have reviewed the patient's chart and labs.  Questions were answered to the patient's satisfaction.     Aviva Signs

## 2017-11-21 NOTE — Transfer of Care (Signed)
Immediate Anesthesia Transfer of Care Note  Patient: Crystal Roberts  Procedure(s) Performed: EXCISION SEBACEOUS CYST ON BACK (N/A Back)  Patient Location: PACU  Anesthesia Type:General  Level of Consciousness: awake and patient cooperative  Airway & Oxygen Therapy: Patient Spontanous Breathing  Post-op Assessment: Report given to RN, Post -op Vital signs reviewed and stable and Patient moving all extremities  Post vital signs: Reviewed and stable  Last Vitals:  Vitals Value Taken Time  BP    Temp    Pulse    Resp    SpO2      Last Pain:  Vitals:   11/21/17 0930  PainSc: 0-No pain      Patients Stated Pain Goal: 9 ([id number])  Complications: No apparent anesthesia complications

## 2017-11-21 NOTE — Op Note (Signed)
Patient:  Crystal Roberts  DOB:  1955-12-27  MRN:  [id number]   Preop Diagnosis: Sebaceous cyst, back  Postop Diagnosis: Same  Procedure: Excision of 3 cm sebaceous cyst, back  Surgeon: Aviva Signs, MD  Anes: General  Indications: Patient is a 62 year old white female who presents with a recurrent sebaceous cyst in the left upper back.  The risks and benefits of the procedure including bleeding, infection, and recurrence of the cyst were fully explained to the patient, who gave informed consent.  Procedure note: The patient was placed in the right lateral decubitus position after general anesthesia was administered.  The upper back was prepped and draped using usual sterile technique with DuraPrep.  Surgical site confirmation was performed.  An incision was made elliptically around the punctum of the sebaceous cyst.  Sebaceous cyst was then removed in total without difficulty.  It was disposed of.  A bleeding was controlled using Bovie electrocautery.  The wound was irrigated with normal saline.  0.5% Sensorcaine was instilled into the surrounding wound.  The skin was closed using a 3-0 nylon interrupted suture.  Betadine ointment and dry sterile dressing were applied.  All tape and needle counts were correct at the end of the procedure.  The patient was awakened and transferred to PACU in stable condition.    Complications: None  EBL: Minimal  Specimen: None

## 2017-11-21 NOTE — Anesthesia Procedure Notes (Signed)
Procedure Name: LMA Insertion Date/Time: 11/21/2017 11:30 AM Performed by: Charmaine Downs, CRNA Pre-anesthesia Checklist: Patient identified, Patient being monitored, Emergency Drugs available, Timeout performed and Suction available Patient Re-evaluated:Patient Re-evaluated prior to induction Oxygen Delivery Method: Circle System Utilized Preoxygenation: Pre-oxygenation with 100% oxygen Induction Type: IV induction Ventilation: Mask ventilation without difficulty LMA: LMA inserted LMA Size: 4.0 Number of attempts: 1 Placement Confirmation: positive ETCO2 and breath sounds checked- equal and bilateral Tube secured with: Tape Dental Injury: Teeth and Oropharynx as per pre-operative assessment

## 2017-11-21 NOTE — Anesthesia Preprocedure Evaluation (Signed)
Anesthesia Evaluation  Patient identified by MRN, date of birth, ID band Patient awake    Reviewed: Allergy & Precautions, H&P , NPO status , Patient's Chart, lab work & pertinent test results, reviewed documented beta blocker date and time   Airway Mallampati: II  TM Distance: >3 FB Neck ROM: limited    Dental no notable dental hx.    Pulmonary shortness of breath, asthma , former smoker,    Pulmonary exam normal breath sounds clear to auscultation       Cardiovascular Exercise Tolerance: Good hypertension, negative cardio ROS   Rhythm:regular Rate:Normal     Neuro/Psych PSYCHIATRIC DISORDERS Anxiety Depression negative neurological ROS     GI/Hepatic Neg liver ROS, GERD  ,  Endo/Other  diabetesHypothyroidism   Renal/GU negative Renal ROS  negative genitourinary   Musculoskeletal   Abdominal   Peds  Hematology negative hematology ROS (+)   Anesthesia Other Findings   Reproductive/Obstetrics negative OB ROS                             Anesthesia Physical Anesthesia Plan  ASA: III  Anesthesia Plan: General   Post-op Pain Management:    Induction:   PONV Risk Score and Plan:   Airway Management Planned:   Additional Equipment:   Intra-op Plan:   Post-operative Plan:   Informed Consent: I have reviewed the patients History and Physical, chart, labs and discussed the procedure including the risks, benefits and alternatives for the proposed anesthesia with the patient or authorized representative who has indicated his/her understanding and acceptance.   Dental Advisory Given  Plan Discussed with: CRNA  Anesthesia Plan Comments:         Anesthesia Quick Evaluation

## 2017-11-21 NOTE — Discharge Instructions (Signed)
Wound Care, Adult Taking care of your wound properly can help to prevent pain and infection. It can also help your wound to heal more quickly. How is this treated? Wound care  Follow instructions from your health care provider about how to take care of your wound. Make sure you: ? Wash your hands with soap and water before you change the bandage (dressing). If soap and water are not available, use hand sanitizer. ? Change your dressing as told by your health care provider. ? Leave stitches (sutures), skin glue, or adhesive strips in place. These skin closures may need to stay in place for 2 weeks or longer. If adhesive strip edges start to loosen and curl up, you may trim the loose edges. Do not remove adhesive strips completely unless your health care provider tells you to do that.  Check your wound area every day for signs of infection. Check for: ? More redness, swelling, or pain. ? More fluid or blood. ? Warmth. ? Pus or a bad smell.  Ask your health care provider if you should clean the wound with mild soap and water. Doing this may include: ? Using a clean towel to pat the wound dry after cleaning it. Do not rub or scrub the wound. ? Applying a cream or ointment. Do this only as told by your health care provider. ? Covering the incision with a clean dressing.  Ask your health care provider when you can leave the wound uncovered. Medicines   If you were prescribed an antibiotic medicine, cream, or ointment, take or use the antibiotic as told by your health care provider. Do not stop taking or using the antibiotic even if your condition improves.  Take over-the-counter and prescription medicines only as told by your health care provider. If you were prescribed pain medicine, take it at least 30 minutes before doing any wound care or as told by your health care provider. General instructions  Return to your normal activities as told by your health care provider. Ask your health care  provider what activities are safe.  Do not scratch or pick at the wound.  Keep all follow-up visits as told by your health care provider. This is important.  Eat a diet that includes protein, vitamin A, vitamin C, and other nutrient-rich foods. These help the wound heal: ? Protein-rich foods include meat, dairy, beans, nuts, and other sources. ? Vitamin A-rich foods include carrots and dark green, leafy vegetables. ? Vitamin C-rich foods include citrus, tomatoes, and other fruits and vegetables. ? Nutrient-rich foods have protein, carbohydrates, fat, vitamins, or minerals. Eat a variety of healthy foods including vegetables, fruits, and whole grains. Contact a health care provider if:  You received a tetanus shot and you have swelling, severe pain, redness, or bleeding at the injection site.  Your pain is not controlled with medicine.  You have more redness, swelling, or pain around the wound.  You have more fluid or blood coming from the wound.  Your wound feels warm to the touch.  You have pus or a bad smell coming from the wound.  You have a fever or chills.  You are nauseous or you vomit.  You are dizzy. Get help right away if:  You have a red streak going away from your wound.  The edges of the wound open up and separate.  Your wound is bleeding and the bleeding does not stop with gentle pressure.  You have a rash.  You faint.  You have trouble  breathing. This information is not intended to replace advice given to you by your health care provider. Make sure you discuss any questions you have with your health care provider. Document Released: 10/10/2007 Document Revised: 08/30/2015 Document Reviewed: 07/18/2015 Elsevier Interactive Patient Education  2017 [place], Care After These instructions provide you with information about caring for yourself after your procedure. Your health care provider may also give you more specific  instructions. Your treatment has been planned according to current medical practices, but problems sometimes occur. Call your health care provider if you have any problems or questions after your procedure. What can I expect after the procedure? After your procedure, it is common to:  Feel sleepy for several hours.  Feel clumsy and have poor balance for several hours.  Feel forgetful about what happened after the procedure.  Have poor judgment for several hours.  Feel nauseous or vomit.  Have a sore throat if you had a breathing tube during the procedure.  Follow these instructions at home: For at least 24 hours after the procedure:   Do not: ? Participate in activities in which you could fall or become injured. ? Drive. ? Use heavy machinery. ? Drink alcohol. ? Take sleeping pills or medicines that cause drowsiness. ? Make important decisions or sign legal documents. ? Take care of children on your own.  Rest. Eating and drinking  Follow the diet that is recommended by your health care provider.  If you vomit, drink water, juice, or soup when you can drink without vomiting.  Make sure you have little or no nausea before eating solid foods. General instructions  Have a responsible adult stay with you until you are awake and alert.  Take over-the-counter and prescription medicines only as told by your health care provider.  If you smoke, do not smoke without supervision.  Keep all follow-up visits as told by your health care provider. This is important. Contact a health care provider if:  You keep feeling nauseous or you keep vomiting.  You feel light-headed.  You develop a rash.  You have a fever. Get help right away if:  You have trouble breathing. This information is not intended to replace advice given to you by your health care provider. Make sure you discuss any questions you have with your health care provider. Document Released: 04/23/2015 Document  Revised: 08/23/2015 Document Reviewed: 04/23/2015 Elsevier Interactive Patient Education  [employer].

## 2017-11-21 NOTE — Anesthesia Postprocedure Evaluation (Signed)
Anesthesia Post Note  Patient: Crystal Roberts  Procedure(s) Performed: EXCISION SEBACEOUS CYST ON BACK (N/A Back)  Patient location during evaluation: PACU Anesthesia Type: General Level of consciousness: awake and patient cooperative Pain management: pain level controlled Vital Signs Assessment: post-procedure vital signs reviewed and stable Respiratory status: spontaneous breathing, nonlabored ventilation and respiratory function stable Cardiovascular status: blood pressure returned to baseline Postop Assessment: no apparent nausea or vomiting Anesthetic complications: no     Last Vitals:  Vitals:   11/21/17 1110 11/21/17 1115  BP:    Pulse:    Resp: (!) 24 18  Temp:    SpO2: 98% 95%    Last Pain:  Vitals:   11/21/17 0930  PainSc: 0-No pain                 Dezhane Staten J

## 2017-11-24 ENCOUNTER — Encounter (HOSPITAL_COMMUNITY): Payer: Self-pay | Admitting: General Surgery

## 2017-12-02 MED FILL — LEVOTHYROXINE 75 MCG TABLET: 75 | 90 days supply | Qty: 90 | Fill #1

## 2017-12-03 ENCOUNTER — Ambulatory Visit (INDEPENDENT_AMBULATORY_CARE_PROVIDER_SITE_OTHER): Payer: 59 | Admitting: Bariatrics

## 2017-12-04 ENCOUNTER — Ambulatory Visit (INDEPENDENT_AMBULATORY_CARE_PROVIDER_SITE_OTHER): Payer: 59 | Admitting: Bariatrics

## 2017-12-04 ENCOUNTER — Ambulatory Visit (INDEPENDENT_AMBULATORY_CARE_PROVIDER_SITE_OTHER): Payer: Self-pay | Admitting: General Surgery

## 2017-12-04 ENCOUNTER — Encounter: Payer: Self-pay | Admitting: General Surgery

## 2017-12-04 VITALS — BP 129/75 | HR 76 | Temp 96.8°F | Resp 18 | Wt 218.0 lb

## 2017-12-04 VITALS — BP 109/69 | HR 79 | Temp 98.4°F | Ht 64.0 in | Wt 214.0 lb

## 2017-12-04 DIAGNOSIS — Z09 Encounter for follow-up examination after completed treatment for conditions other than malignant neoplasm: Secondary | ICD-10-CM

## 2017-12-04 DIAGNOSIS — Z6836 Body mass index (BMI) 36.0-36.9, adult: Secondary | ICD-10-CM

## 2017-12-04 DIAGNOSIS — I10 Essential (primary) hypertension: Secondary | ICD-10-CM | POA: Diagnosis not present

## 2017-12-04 DIAGNOSIS — E119 Type 2 diabetes mellitus without complications: Secondary | ICD-10-CM | POA: Diagnosis not present

## 2017-12-04 DIAGNOSIS — F3289 Other specified depressive episodes: Secondary | ICD-10-CM | POA: Diagnosis not present

## 2017-12-04 NOTE — Progress Notes (Signed)
Subjective:     Crystal Roberts  Status post excision of sebaceous cyst on her back.  Patient doing well.  Has no complaints. Objective:    BP 129/75 (BP Location: Left Arm, Patient Position: Sitting, Cuff Size: Large)   Pulse 76   Temp (!) 96.8 F (36 C) (Temporal)   Resp 18   Wt 218 lb (98.9 kg)   BMI 37.42 kg/m   General:  alert, cooperative and no distress  Back incision healing well.  Sutures removed, Steri-Strips applied.     Assessment:    Doing well postoperatively.    Plan:   Resume normal activity.  Follow-up here as needed.

## 2017-12-09 MED FILL — metFORMIN HCL 500 MG TABS: 500 | 30 days supply | Qty: 120 | Fill #1

## 2017-12-10 ENCOUNTER — Encounter (INDEPENDENT_AMBULATORY_CARE_PROVIDER_SITE_OTHER): Payer: Self-pay | Admitting: Bariatrics

## 2017-12-10 NOTE — Progress Notes (Signed)
Office: [phone]  /  Fax: [phone]   HPI:   Chief Complaint: OBESITY Crystal Roberts is here to discuss her progress with her obesity treatment plan. She is on the Category 2 plan and is following her eating plan approximately 90 to 95 % of the time. She states she is walking 20 minutes 3 times per week. Crystal Roberts had surgery (cyst from her back) and she has not been as active. Her weight is 214 lb (97.1 kg) today and has had Crystal Roberts weight loss of 1 pound over Crystal Roberts period of 3 weeks since her last visit. She has lost 27 lbs since starting treatment with [place].  Diabetes II Crystal Roberts has Crystal Roberts diagnosis of diabetes type II. Crystal Roberts states fasting BGs range between 100 and 116 and she denies any hypoglycemic episodes. Last A1c was at 6.1 She has been working on intensive lifestyle modifications including diet, exercise, and weight loss to help control her blood glucose levels.  Hypertension Crystal Roberts is Crystal Roberts 62 y.o. female with hypertension. She is taking Losartan-HCTZ. Crystal Roberts denies chest pain or shortness of breath on exertion. She is working weight loss to help control her blood pressure with the goal of decreasing her risk of heart attack and stroke. Crystal Roberts blood pressure is well controlled.  Depression with emotional eating behaviors Crystal Roberts is struggling with emotional eating and using food for comfort to the extent that it is negatively impacting her health. She often snacks when she is not hungry. Crystal Roberts sometimes feels she is out of control and then feels guilty that she made poor food choices. She has been working on behavior modification techniques to help reduce her emotional eating and has been somewhat successful. She is currently taking Wellbutrin XL 300 mg daily. She shows no sign of suicidal or homicidal ideations.  Depression screen Crystal Roberts 2/9 09/10/2017 07/11/2017  Decreased Interest 2 0  Down, Depressed, Hopeless 1 0  PHQ - 2 Score 3 0  Altered sleeping 1 -  Tired, decreased energy 2 -  Change  in appetite 1 -  Feeling bad or failure about yourself  1 -  Trouble concentrating 1 -  Moving slowly or fidgety/restless 0 -  Suicidal thoughts 0 -  PHQ-9 Score 9 -  Difficult doing work/chores Not difficult at all -     ALLERGIES: Allergies  Allergen Reactions  . Sulfa Antibiotics Itching and Rash    MEDICATIONS: Current Outpatient Medications on File Prior to Visit  Medication Sig Dispense Refill  . aspirin 81 MG tablet Take 81 mg by mouth daily.    Marland Kitchen buPROPion (WELLBUTRIN XL) 300 MG 24 hr tablet Take 300 mg by mouth daily.    Marland Kitchen CINNAMON PO Take 500 mg by mouth at bedtime.     Marland Kitchen HYDROcodone-acetaminophen (NORCO) 7.5-325 MG tablet Take 1 tablet by mouth every 6 (six) hours as needed for moderate pain. 25 tablet 0  . levothyroxine (SYNTHROID, LEVOTHROID) 75 MCG tablet Take 75 mcg by mouth daily before breakfast.    . losartan-hydrochlorothiazide (HYZAAR) 100-25 MG per tablet Take 1 tablet by mouth daily.    . metFORMIN (GLUCOPHAGE-XR) 750 MG 24 hr tablet Take 750 mg by mouth 2 (two) times daily with Crystal Roberts meal.    . Multiple Vitamin (MULTIVITAMIN) tablet Take 1 tablet by mouth daily.    Crystal Roberts (LUBRICANT EYE DROPS) 0.4-0.3 % SOLN Place 1 drop into both eyes 3 (three) times daily as needed (for itchy/dry eyes.).    Marland Kitchen predniSONE (DELTASONE)  10 MG tablet Take 10 mg by mouth 2 (two) times daily.    . simvastatin (ZOCOR) 40 MG tablet Take 40 mg by mouth at bedtime.      No current facility-administered medications on file prior to visit.     PAST MEDICAL HISTORY: Past Medical History:  Diagnosis Date  . Anxiety   . Arthritis    neck- cervical disc herniation   . Asthma    as child  . Back pain   . Cancer ([place])    skin - low back , insitu  melanoma   . Constipation   . Depression   . Diabetes mellitus without complication ([place])   . Dyspnea   . Gallbladder problem   . GERD (gastroesophageal reflux disease)   . H/O cardiovascular stress test 1990    pt. reports that she was having Crystal Roberts lot of stress & she was seen by cardiac- had stress test & told that it was negative   . Hyperlipidemia   . Hypertension   . Hypothyroidism   . Leg edema   . Pulmonary embolism ([place]) [place]. at [facility]- on blood thinning med., thought to be related to lack of activity, smoking & BCP    PAST SURGICAL HISTORY: Past Surgical History:  Procedure Laterality Date  . ANTERIOR CERVICAL DECOMP/DISCECTOMY FUSION N/Crystal Roberts 08/16/2016   Procedure: Cervical Four-Five Anterior cervical decompression/discectomy/fusion;  Surgeon: Erline Levine, MD;  Location: [facility];  Service: Neurosurgery;  Laterality: N/Crystal Roberts;  C4-5 Anterior cervical decompression/discectomy/fusion  . BACK SURGERY     sebaceous cyst removed  . CHOLECYSTECTOMY  3846   umbilical hernia ------ followed cholecystectomy  . CYSTECTOMY Bilateral    cyst on ovaries  . HERNIA REPAIR     umbilical hernia repair   . MASS EXCISION N/Crystal Roberts 11/21/2017   Procedure: EXCISION SEBACEOUS CYST ON BACK;  Surgeon: Aviva Signs, MD;  Location: [facility];  Service: General;  Laterality: N/Crystal Roberts;  . TUBAL LIGATION      SOCIAL HISTORY: Social History   Tobacco Use  . Smoking status: Former Smoker    Packs/day: 1.00    Years: 10.00    Pack years: 10.00    Types: Cigarettes    Last attempt to quit: 08/14/1986    Years since quitting: 31.3  . Smokeless tobacco: Never Used  Substance Use Topics  . Alcohol use: Yes    Comment: very rare   . Drug use: No    FAMILY HISTORY: Family History  Problem Relation Age of Onset  . Heart disease Mother   . Hypertension Mother   . Hyperlipidemia Mother   . Cancer Father        melenoma  . Diabetes Sister   . Stroke Brother   . Heart disease Brother   . Diabetes Paternal Grandmother   . Diabetes Sister   . Cancer Sister        kidney; had kidney removed    ROS: Review of Systems  Constitutional: Positive for weight loss.  Respiratory: Negative for shortness of breath (on exertion).     Cardiovascular: Negative for chest pain.  Psychiatric/Behavioral: Positive for depression. Negative for suicidal ideas.    PHYSICAL EXAM: Blood pressure 109/69, pulse 79, temperature 98.4 F (36.9 C), temperature source Oral, height 5\' 4"  (1.626 m), weight 214 lb (97.1 kg), SpO2 95 %. Body mass index is 36.73 kg/m. Physical Exam  Constitutional: She is oriented to person, place, and time. She appears well-developed and well-nourished.  Cardiovascular: Normal rate.  Pulmonary/Chest: Effort normal.  Musculoskeletal: Normal range of motion.  Neurological: She is oriented to person, place, and time.  Skin: Skin is warm and dry.  Psychiatric: She has Crystal Roberts normal mood and affect. Her behavior is normal. She expresses no homicidal and no suicidal ideation.  Vitals reviewed.   RECENT LABS AND TESTS: BMET    Component Value Date/Time   NA 137 11/18/2017 1051   NA 140 09/10/2017 0923   K 4.1 11/18/2017 1051   CL 104 11/18/2017 1051   CO2 24 11/18/2017 1051   GLUCOSE 89 11/18/2017 1051   BUN 21 (H) 11/18/2017 1051   BUN 11 09/10/2017 0923   CREATININE 0.87 11/18/2017 1051   CALCIUM 9.6 11/18/2017 1051   GFRNONAA >60 11/18/2017 1051   GFRAA >60 11/18/2017 1051   Lab Results  Component Value Date   HGBA1C 6.0 (H) 11/18/2017   HGBA1C 6.1 (H) 09/10/2017   HGBA1C 5.8 (H) 08/13/2016   Lab Results  Component Value Date   INSULIN 27.5 (H) 09/10/2017   CBC    Component Value Date/Time   WBC 8.6 11/18/2017 1051   RBC 4.67 11/18/2017 1051   HGB 13.9 11/18/2017 1051   HGB 13.1 09/10/2017 0923   HCT 43.8 11/18/2017 1051   HCT 39.2 09/10/2017 0923   PLT 361 11/18/2017 1051   MCV 93.8 11/18/2017 1051   MCV 89 09/10/2017 0923   MCH 29.8 11/18/2017 1051   MCHC 31.7 11/18/2017 1051   RDW 13.5 11/18/2017 1051   RDW 13.2 09/10/2017 0923   LYMPHSABS 1.9 11/18/2017 1051   LYMPHSABS 1.4 09/10/2017 0923   MONOABS 0.7 11/18/2017 1051   EOSABS 0.2 11/18/2017 1051   EOSABS 0.3 09/10/2017  0923   BASOSABS 0.1 11/18/2017 1051   BASOSABS 0.0 09/10/2017 0923   Iron/TIBC/Ferritin/ %Sat No results found for: IRON, TIBC, FERRITIN, IRONPCTSAT Lipid Panel     Component Value Date/Time   CHOL 171 09/10/2017 0923   TRIG 105 09/10/2017 0923   HDL 57 09/10/2017 0923   LDLCALC 93 09/10/2017 0923   Hepatic Function Panel     Component Value Date/Time   PROT 7.0 09/10/2017 0923   ALBUMIN 4.4 09/10/2017 0923   AST 25 09/10/2017 0923   ALT 26 09/10/2017 0923   ALKPHOS 82 09/10/2017 0923   BILITOT 0.3 09/10/2017 0923      Component Value Date/Time   TSH 3.390 09/10/2017 0923   Results for Crystal Roberts, Crystal Roberts (MRN [id number]) as of 12/10/2017 13:37  Ref. Range 09/10/2017 09:23  Vitamin D, 25-Hydroxy Latest Ref Range: 30.0 - 100.0 ng/mL 46.3   ASSESSMENT AND PLAN: Type 2 diabetes mellitus without complication, without long-term current use of insulin (HCC)  Essential hypertension  Other depression - with emotional eating  Class 2 severe obesity with serious comorbidity and body mass index (BMI) of 36.0 to 36.9 in adult, unspecified obesity type ([place])  PLAN:  Diabetes II Crystal Roberts has been given extensive diabetes education by myself today including ideal fasting and post-prandial blood glucose readings, individual ideal Hgb A1c goals and hypoglycemia prevention. We discussed the importance of good blood sugar control to decrease the likelihood of diabetic complications such as nephropathy, neuropathy, limb loss, blindness, coronary artery disease, and death. We discussed the importance of intensive lifestyle modification including diet, exercise and weight loss as the first line treatment for diabetes. Crystal Roberts agrees to continue metformin and continue checking her fasting blood sugars and 2 hour post prandial blood sugars. She will follow up at the agreed  upon time.  Hypertension We discussed sodium restriction, working on healthy weight loss, and Crystal Roberts regular exercise program as the means  to achieve improved blood pressure control. Crystal Roberts agreed with this plan and agreed to follow up as directed. We will continue to monitor her blood pressure as well as her progress with the above lifestyle modifications. She will continue her medications as prescribed and will watch for signs of hypotension as she continues her lifestyle modifications.  Depression with Emotional Eating Behaviors We discussed behavior modification techniques today to help Crystal Roberts deal with her emotional eating and depression. She has agreed to take Wellbutrin XL 300 mg daily and follow up as directed. I spent > than 50% of the 15 minute visit on counseling as documented in the note.  Obesity Crystal Roberts is currently in the action stage of change. As such, her goal is to continue with weight loss efforts She has agreed to follow the Category 2 plan Crystal Roberts has been instructed to work up to Crystal Roberts goal of 150 minutes of combined cardio and strengthening exercise per week for weight loss and overall health benefits. We discussed the following Behavioral Modification Strategies today: increase H2O intake, increasing lean protein intake, decreasing simple carbohydrates , increasing vegetables, work on meal planning and easy cooking plans, holiday eating strategies and celebration eating strategies "Thanksgiving" handout was given to patient today.  Crystal Roberts has agreed to follow up with our clinic in 4 weeks. She was informed of the importance of frequent follow up visits to maximize her success with intensive lifestyle modifications for her multiple health conditions.   OBESITY BEHAVIORAL INTERVENTION VISIT  Today's visit was # 6   Starting weight: 241 lbs Starting date: 09/10/2017 Today's weight : 214 lbs  Today's date: 12/04/2017 Total lbs lost to date: 18   ASK: We discussed the diagnosis of obesity with Crystal Roberts today and Crystal Roberts agreed to give [place] permission to discuss obesity behavioral modification therapy  today.  ASSESS: Crystal Roberts has the diagnosis of obesity and her BMI today is 36.72 Crystal Roberts is in the action stage of change   ADVISE: Adaliah was educated on the multiple health risks of obesity as well as the benefit of weight loss to improve her health. She was advised of the need for long term treatment and the importance of lifestyle modifications to improve her current health and to decrease her risk of future health problems.  AGREE: Multiple dietary modification options and treatment options were discussed and  Shamikia agreed to follow the recommendations documented in the above note.  ARRANGE: Roshunda was educated on the importance of frequent visits to treat obesity as outlined per CMS and USPSTF guidelines and agreed to schedule her next follow up appointment today.  Corey Skains, am acting as Location manager for [employer]. Owens Shark, DO  I have reviewed the above documentation for accuracy and completeness, and I agree with the above. -Jearld Lesch, DO

## 2017-12-15 ENCOUNTER — Encounter (INDEPENDENT_AMBULATORY_CARE_PROVIDER_SITE_OTHER): Payer: Self-pay | Admitting: Bariatrics

## 2017-12-15 NOTE — Telephone Encounter (Signed)
Please address. Thanks!

## 2017-12-22 MED FILL — buPROPion HCL ER (XL) 300 M: 300 | 90 days supply | Qty: 90 | Fill #1

## 2017-12-29 ENCOUNTER — Ambulatory Visit (INDEPENDENT_AMBULATORY_CARE_PROVIDER_SITE_OTHER): Payer: 59 | Admitting: Bariatrics

## 2017-12-29 ENCOUNTER — Other Ambulatory Visit: Payer: Self-pay | Admitting: Family Medicine

## 2017-12-29 ENCOUNTER — Other Ambulatory Visit (HOSPITAL_COMMUNITY): Payer: Self-pay | Admitting: Family Medicine

## 2017-12-29 DIAGNOSIS — M25512 Pain in left shoulder: Secondary | ICD-10-CM

## 2017-12-30 IMAGING — MR MR NECK SOFT TISSUE ONLY W/O CM
5 of 6 series · 31 of 48 positions shown · non-contrast
Comparison: Neck ultrasound May 01, 2016

CLINICAL DATA: Follow-up abnormal ultrasound, RIGHT posterior neck
mass.

EXAM:
MRI OF THE NECK WITHOUT CONTRAST
TECHNIQUE: Multiplanar, multisequence MR imaging of the neck was performed. No
intravenous contrast was administered. Patient did not wish to
receive intravenous contrast.

[Series 2: T1 · axial · 5.0mm · 0.94mm/px · z∈[-163,+102]mm · 8 of 50 slices shown (1 of 3)]
[im 1/50]
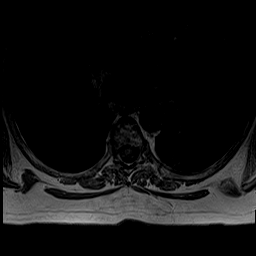
[im 6/50]
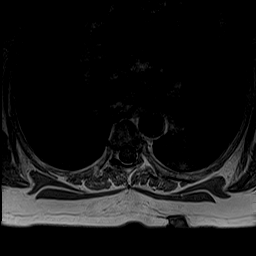
[im 17/50]
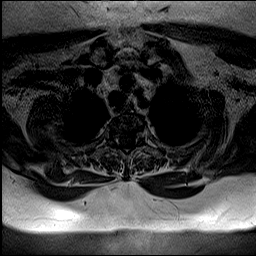
[im 22/50]
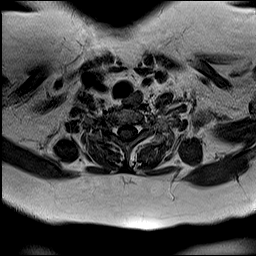
[im 28/50]
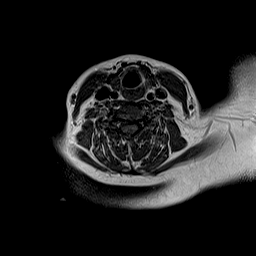
[im 33/50]
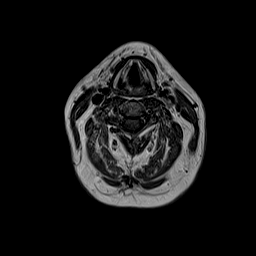
[im 44/50]
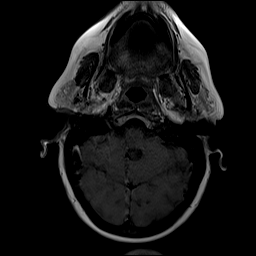
[im 50/50]
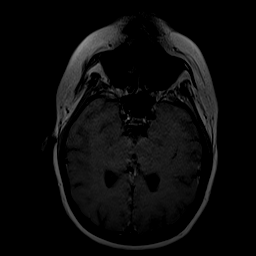

[Series 7: T1 · coronal · 4.0mm · 0.94mm/px · 8 of 40 slices shown (2 of 3)]
[im 1/40]
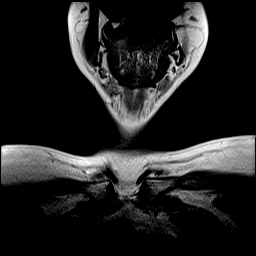
[im 6/40]
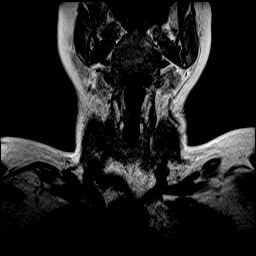
[im 12/40]
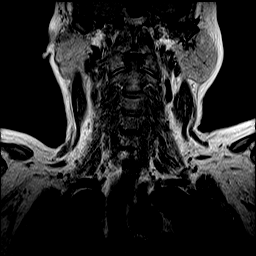
[im 17/40]
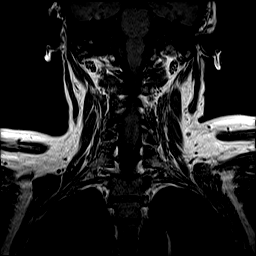
[im 23/40]
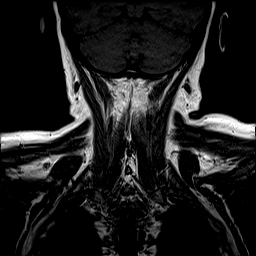
[im 28/40]
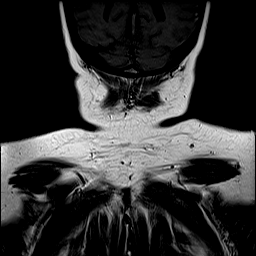
[im 34/40]
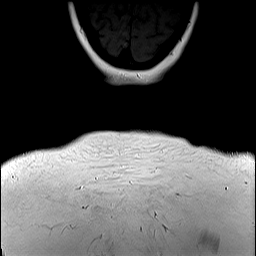
[im 40/40]
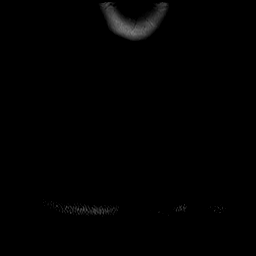

[Series 11: T1 · sagittal · 4.0mm · 0.94mm/px · 7 of 40 slices shown (3 of 3)]
[im 1/40]
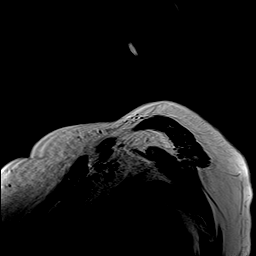
[im 7/40]
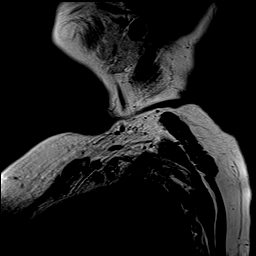
[im 14/40]
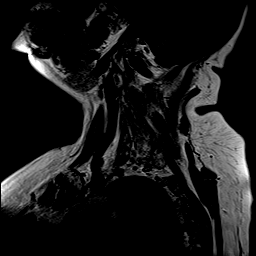
[im 20/40]
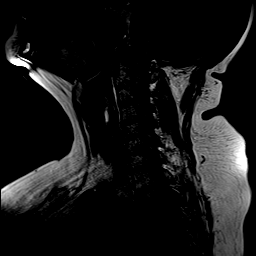
[im 27/40]
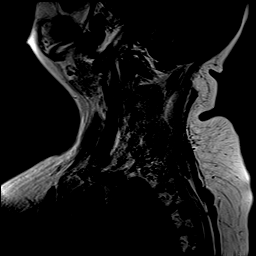
[im 33/40]
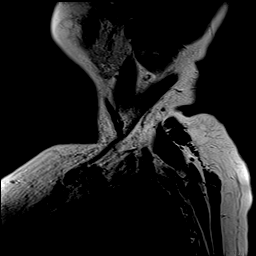
[im 40/40]
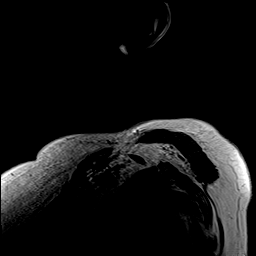

[Series 17: ir sagital · sagittal · 4.0mm · 0.45mm/px · 7 of 40 slices shown]
[im 1/40]
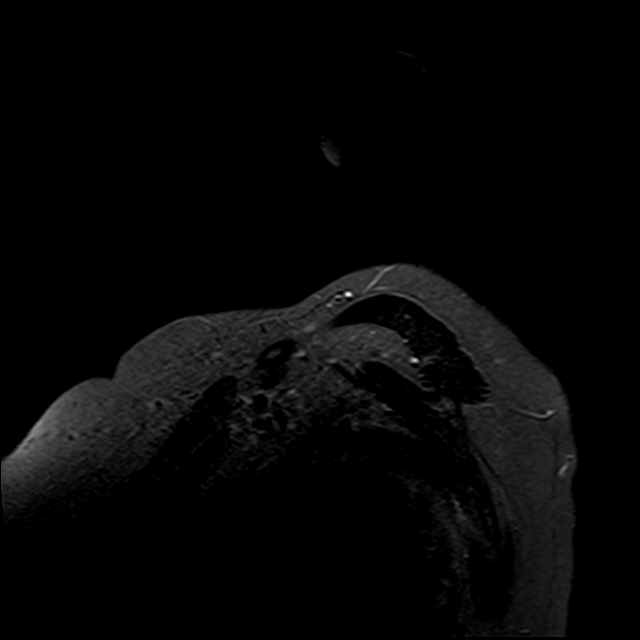
[im 7/40]
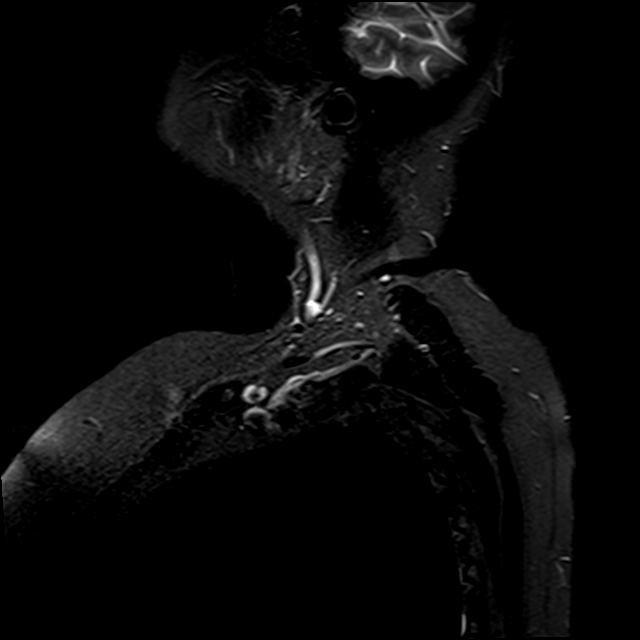
[im 14/40]
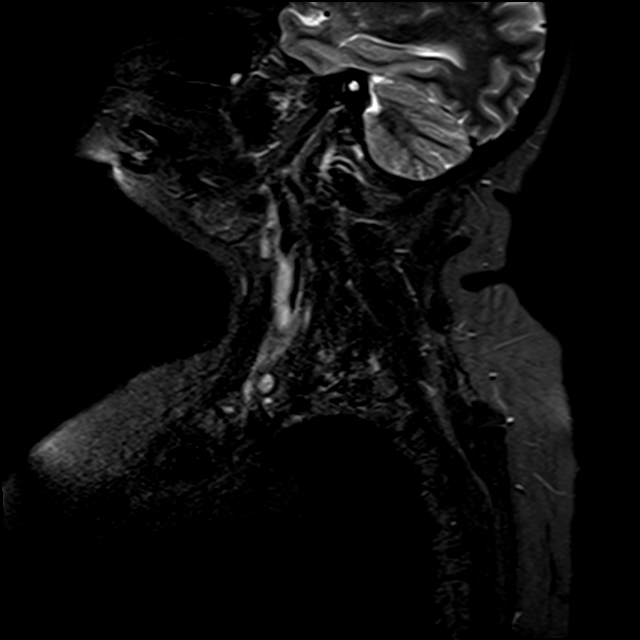
[im 20/40]
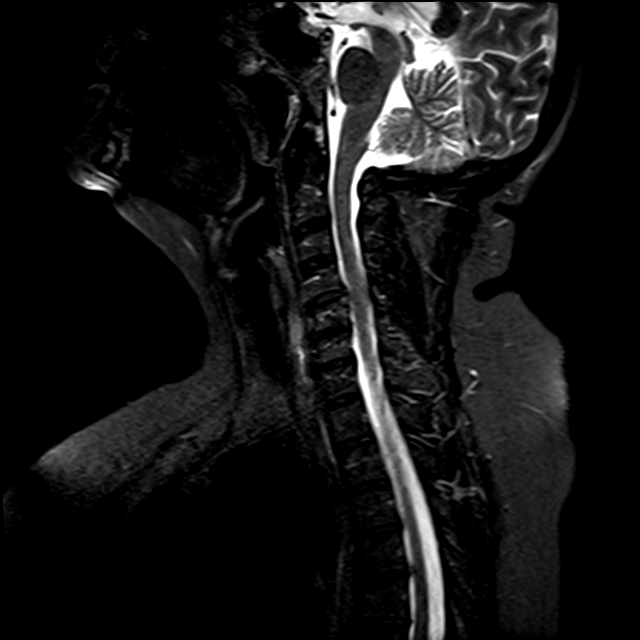
[im 27/40]
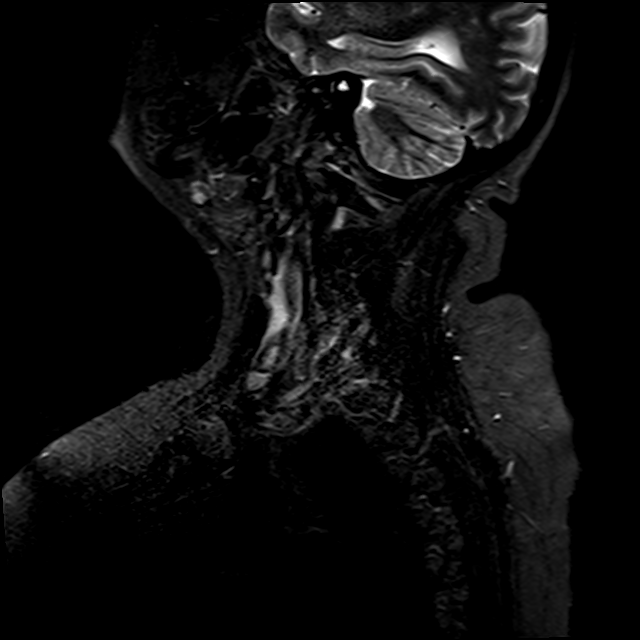
[im 33/40]
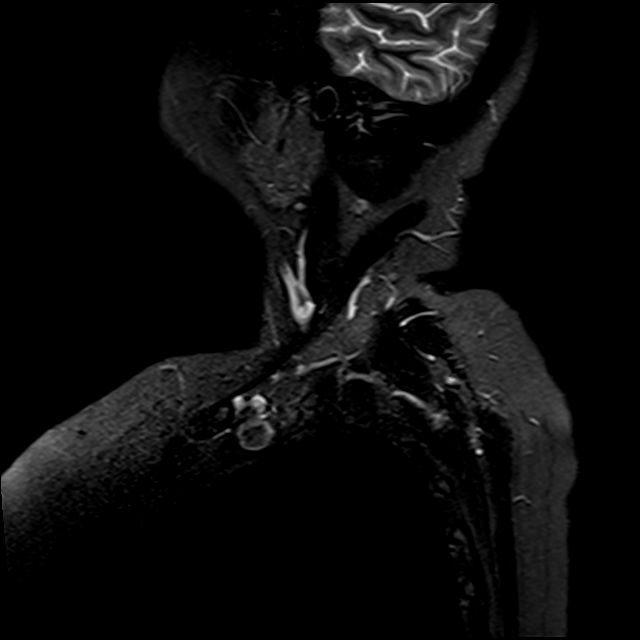
[im 40/40]
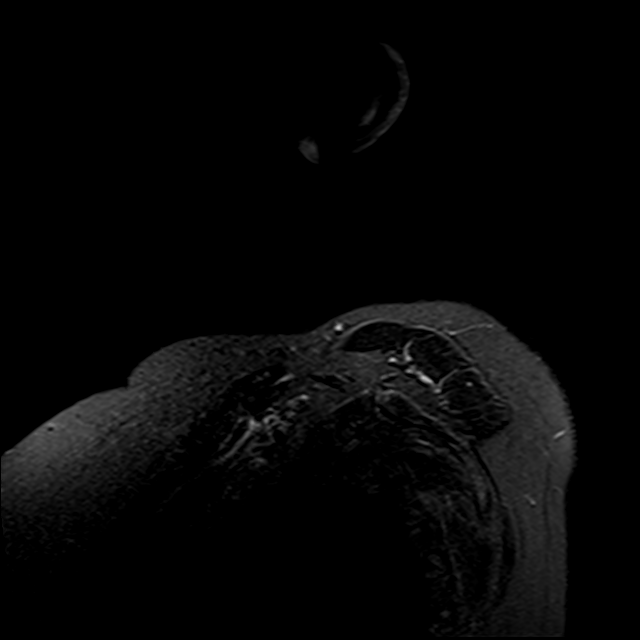

[Series 18: ir coronal · coronal · 4.0mm · 0.58mm/px · 1 of 40 slices shown]
[im 1/40]
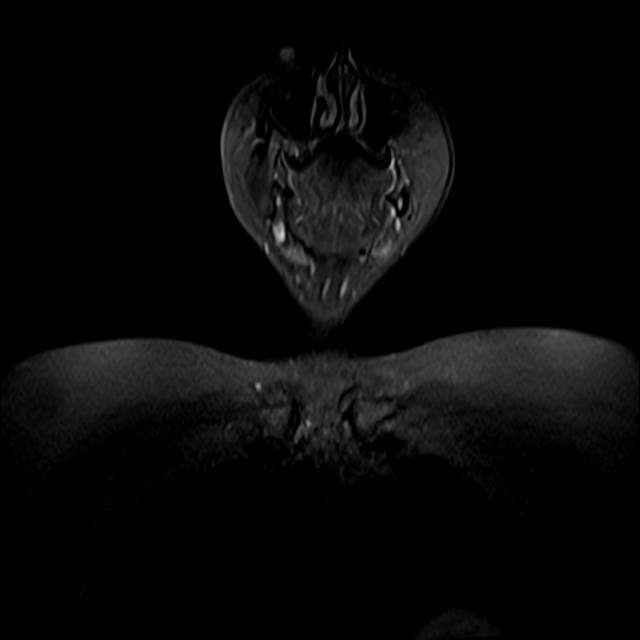

[31 of 48 positions shown; findings below may reference images not displayed]

FINDINGS: PHARYNX AND LARYNX: Normal.  Widely patent airway.

SALIVARY GLANDS: Normal.

THYROID: Normal.

LYMPH NODES: No lymphadenopathy by CT size criteria.

VASCULAR: Normal flow-voids.

LIMITED INTRACRANIAL: Normal.

MASTOIDS AND VISUALIZED PARANASAL SINUSES: Sub centimeter LEFT
maxillary mucosal retention cyst. Small volume RIGHT mastoid
effusion.

SKELETON: No abnormal bone marrow signal though not tailored for
evaluation. 6 mm RIGHT central C4-5 disc protrusion and annular
fissure results in mild to moderate canal stenosis and moderate
RIGHT neural foraminal narrowing.

UPPER CHEST: Lung apices are clear. No superior mediastinal
lymphadenopathy.

OTHER: RIGHT posterior neck marker, underlying 15 mm fat signal mass
isointense to surrounding subcutaneous fat which completely
suppresses on STIR signal. Homogeneous appearance of the
subcutaneous fat, normal symmetric appearance of the subjacent
musculature. Included thoracic soft tissues demonstrate 16 mm LEFT
superficial subcutaneous sebaceous cyst. Small bilateral shoulder
effusions.
IMPRESSION: 15 mm RIGHT posterior neck subcutaneous fat lipoma by noncontrast
MRI.

Moderate C4-5 disc protrusion resulting in mild to moderate canal
stenosis and moderate RIGHT neural foraminal narrowing.

## 2018-01-05 ENCOUNTER — Ambulatory Visit (HOSPITAL_COMMUNITY)
Admission: RE | Admit: 2018-01-05 | Discharge: 2018-01-05 | Disposition: A | Discharge status: Home / Self Care | Payer: 59 | Source: Ambulatory Visit | Attending: Family Medicine | Admitting: Family Medicine

## 2018-01-05 ENCOUNTER — Ambulatory Visit (INDEPENDENT_AMBULATORY_CARE_PROVIDER_SITE_OTHER): Payer: 59 | Admitting: Bariatrics

## 2018-01-05 ENCOUNTER — Encounter (INDEPENDENT_AMBULATORY_CARE_PROVIDER_SITE_OTHER): Payer: Self-pay | Admitting: Bariatrics

## 2018-01-05 VITALS — BP 109/72 | HR 71 | Temp 98.3°F | Ht 64.0 in | Wt 212.0 lb

## 2018-01-05 DIAGNOSIS — Z6836 Body mass index (BMI) 36.0-36.9, adult: Secondary | ICD-10-CM | POA: Diagnosis not present

## 2018-01-05 DIAGNOSIS — F3289 Other specified depressive episodes: Secondary | ICD-10-CM

## 2018-01-05 DIAGNOSIS — X58XXXA Exposure to other specified factors, initial encounter: Secondary | ICD-10-CM | POA: Diagnosis not present

## 2018-01-05 DIAGNOSIS — M19012 Primary osteoarthritis, left shoulder: Secondary | ICD-10-CM | POA: Insufficient documentation

## 2018-01-05 DIAGNOSIS — M75122 Complete rotator cuff tear or rupture of left shoulder, not specified as traumatic: Secondary | ICD-10-CM | POA: Diagnosis not present

## 2018-01-05 DIAGNOSIS — I10 Essential (primary) hypertension: Secondary | ICD-10-CM | POA: Diagnosis not present

## 2018-01-05 DIAGNOSIS — E119 Type 2 diabetes mellitus without complications: Secondary | ICD-10-CM

## 2018-01-05 DIAGNOSIS — S46812A Strain of other muscles, fascia and tendons at shoulder and upper arm level, left arm, initial encounter: Secondary | ICD-10-CM | POA: Insufficient documentation

## 2018-01-05 DIAGNOSIS — M25512 Pain in left shoulder: Secondary | ICD-10-CM | POA: Insufficient documentation

## 2018-01-12 MED FILL — metFORMIN HCL 500 MG TABS: 500 | 30 days supply | Qty: 120 | Fill #2

## 2018-01-12 MED FILL — MELOXICAM 15 MG TABLET: 15 | 90 days supply | Qty: 90 | Fill #0

## 2018-01-12 NOTE — Progress Notes (Signed)
Office: [phone]  /  Fax: [phone]   HPI:   Chief Complaint: OBESITY Crystal Roberts is here to discuss her progress with her obesity treatment plan. She is on the Category 2 plan and is following her eating plan approximately 100 % of the time. She states she is exercising 0 minutes 0 times per week. Crystal Roberts is doing well with weight loss on the Category 2 plan. Her weight is 212 lb (96.2 kg) today and has had a weight loss of 2 pounds over a period of 4 to 5 weeks since her last visit. She has lost 29 lbs since starting treatment with [place].  Diabetes II Ethie has a diagnosis of diabetes type II. She is currently taking metformin. Crystal Roberts states fasting BGs range between 100 and 110 and she is not checking 2 hour post prandial BGs. She denies any hypoglycemic episodes. Last A1c was at 6.1 She has been working on intensive lifestyle modifications including diet, exercise, and weight loss to help control her blood glucose levels.  Hypertension TANAI BOULER is a 62 y.o. female with hypertension. She is taking Losartan-HCTZ. Bo Mcclintock denies chest pain or shortness of breath on exertion. She is working weight loss to help control her blood pressure with the goal of decreasing her risk of heart attack and stroke. [name] blood pressure is well controlled.  Depression with emotional eating behaviors Tesha is taking Bupropion 300 mg and she has minimal cravings. She struggles with emotional eating and using food for comfort to the extent that it is negatively impacting her health. She often snacks when she is not hungry. Crystal Roberts sometimes feels she is out of control and then feels guilty that she made poor food choices. She has been working on behavior modification techniques to help reduce her emotional eating and has been somewhat successful. She shows no sign of suicidal or homicidal ideations.  Depression screen [facility] 2/9 09/10/2017 07/11/2017  Decreased Interest 2 0  Down, Depressed, Hopeless 1 0    PHQ - 2 Score 3 0  Altered sleeping 1 -  Tired, decreased energy 2 -  Change in appetite 1 -  Feeling bad or failure about yourself  1 -  Trouble concentrating 1 -  Moving slowly or fidgety/restless 0 -  Suicidal thoughts 0 -  PHQ-9 Score 9 -  Difficult doing work/chores Not difficult at all -     ASSESSMENT AND PLAN:  Type 2 diabetes mellitus without complication, without long-term current use of insulin (HCC)  Other depression - with emotional eating  Class 2 severe obesity with serious comorbidity and body mass index (BMI) of 36.0 to 36.9 in adult, unspecified obesity type ([place])  PLAN:  Diabetes II Tzipora has been given extensive diabetes education by myself today including ideal fasting and post-prandial blood glucose readings, individual ideal Hgb A1c goals  and hypoglycemia prevention. We discussed the importance of good blood sugar control to decrease the likelihood of diabetic complications such as nephropathy, neuropathy, limb loss, blindness, coronary artery disease, and death. We discussed the importance of intensive lifestyle modification including diet, exercise and weight loss as the first line treatment for diabetes. Fiora agrees to continue her diabetes medications and will follow up at the agreed upon time.  Hypertension We discussed sodium restriction, working on healthy weight loss, and a regular exercise program as the means to achieve improved blood pressure control. Velora agreed with this plan and agreed to follow up as directed. We will continue to monitor her blood  pressure as well as her progress with the above lifestyle modifications. She will continue her medications as prescribed and will watch for signs of hypotension as she continues her lifestyle modifications.  Depression with Emotional Eating Behaviors We discussed behavior modification techniques today to help Caitlyne deal with her emotional eating and depression. She has agreed to continue Wellbutrin XL  300 mg qd and follow up as directed.  I spent > than 50% of the 15 minute visit on counseling as documented in the note.  Obesity Keyanni is currently in the action stage of change. As such, her goal is to continue with weight loss efforts She has agreed to follow the Category 2 plan Crystal Roberts has been instructed to work up to a goal of 150 minutes of combined cardio and strengthening exercise per week for weight loss and overall health benefits. We discussed the following Behavioral Modification Strategies today: increase H2O intake, increasing lean protein intake, decreasing simple carbohydrates, minimize non-starchy vegetables, increasing vegetables and work on meal planning and easy cooking plans Handouts for homemade seasonings and store bought seasonings were provided to patient today.  Crystal Roberts has agreed to follow up with our clinic in 2 weeks. She was informed of the importance of frequent follow up visits to maximize her success with intensive lifestyle modifications for her multiple health conditions.  ALLERGIES: Allergies  Allergen Reactions  . Sulfa Antibiotics Itching and Rash    MEDICATIONS: Current Outpatient Medications on File Prior to Visit  Medication Sig Dispense Refill  . aspirin 81 MG tablet Take 81 mg by mouth daily.    Marland Kitchen buPROPion (WELLBUTRIN XL) 300 MG 24 hr tablet Take 300 mg by mouth daily.    Marland Kitchen CINNAMON PO Take 500 mg by mouth at bedtime.     Marland Kitchen levothyroxine (SYNTHROID, LEVOTHROID) 75 MCG tablet Take 75 mcg by mouth daily before breakfast.    . losartan-hydrochlorothiazide (HYZAAR) 100-25 MG per tablet Take 1 tablet by mouth daily.    . metFORMIN (GLUCOPHAGE-XR) 750 MG 24 hr tablet Take 750 mg by mouth 2 (two) times daily with a meal.    . Multiple Vitamin (MULTIVITAMIN) tablet Take 1 tablet by mouth daily.    Vladimir Faster Glycol-Propyl Glycol (LUBRICANT EYE DROPS) 0.4-0.3 % SOLN Place 1 drop into both eyes 3 (three) times daily as needed (for itchy/dry eyes.).    Marland Kitchen  simvastatin (ZOCOR) 40 MG tablet Take 40 mg by mouth at bedtime.      No current facility-administered medications on file prior to visit.     PAST MEDICAL HISTORY: Past Medical History:  Diagnosis Date  . Anxiety   . Arthritis    neck- cervical disc herniation   . Asthma    as child  . Back pain   . Cancer ([place])    skin - low back , insitu  melanoma   . Constipation   . Depression   . Diabetes mellitus without complication ([place])   . Dyspnea   . Gallbladder problem   . GERD (gastroesophageal reflux disease)   . H/O cardiovascular stress test 1990   pt. reports that she was having a lot of stress & she was seen by cardiac- had stress test & told that it was negative   . Hyperlipidemia   . Hypertension   . Hypothyroidism   . Leg edema   . Pulmonary embolism ([place]) [place]. at [facility]- on blood thinning med., thought to be related to lack of activity, smoking & BCP  PAST SURGICAL HISTORY: Past Surgical History:  Procedure Laterality Date  . ANTERIOR CERVICAL DECOMP/DISCECTOMY FUSION N/A 08/16/2016   Procedure: Cervical Four-Five Anterior cervical decompression/discectomy/fusion;  Surgeon: Erline Levine, MD;  Location: [facility] [place] [facility];  Service: Neurosurgery;  Laterality: N/A;  C4-5 Anterior cervical decompression/discectomy/fusion  . BACK SURGERY     sebaceous cyst removed  . CHOLECYSTECTOMY  6237   umbilical hernia ------ followed cholecystectomy  . CYSTECTOMY Bilateral    cyst on ovaries  . HERNIA REPAIR     umbilical hernia repair   . MASS EXCISION N/A 11/21/2017   Procedure: EXCISION SEBACEOUS CYST ON BACK;  Surgeon: Aviva Signs, MD;  Location: [facility];  Service: General;  Laterality: N/A;  . TUBAL LIGATION      SOCIAL HISTORY: Social History   Tobacco Use  . Smoking status: Former Smoker    Packs/day: 1.00    Years: 10.00    Pack years: 10.00    Types: Cigarettes    Last attempt to quit: 08/14/1986    Years since quitting: 31.4  . Smokeless tobacco: Never Used    Substance Use Topics  . Alcohol use: Yes    Comment: very rare   . Drug use: No    FAMILY HISTORY: Family History  Problem Relation Age of Onset  . Heart disease Mother   . Hypertension Mother   . Hyperlipidemia Mother   . Cancer Father        melenoma  . Diabetes Sister   . Stroke Brother   . Heart disease Brother   . Diabetes Paternal Grandmother   . Diabetes Sister   . Cancer Sister        kidney; had kidney removed    ROS: Review of Systems  Constitutional: Positive for weight loss.  Respiratory: Negative for shortness of breath (on exertion).   Cardiovascular: Negative for chest pain.  Endo/Heme/Allergies:       Negative for hypoglycemia  Psychiatric/Behavioral: Positive for depression. Negative for suicidal ideas.    PHYSICAL EXAM: Blood pressure 109/72, pulse 71, temperature 98.3 F (36.8 C), temperature source Oral, height 5\' 4"  (1.626 m), weight 212 lb (96.2 kg), SpO2 98 %. Body mass index is 36.39 kg/m. Physical Exam Vitals signs reviewed.  Constitutional:      Appearance: Normal appearance. She is well-developed. She is obese.  Cardiovascular:     Rate and Rhythm: Normal rate.  Pulmonary:     Effort: Pulmonary effort is normal.  Musculoskeletal: Normal range of motion.  Skin:    General: Skin is warm and dry.  Neurological:     Mental Status: She is alert and oriented to person, place, and time.  Psychiatric:        Mood and Affect: Mood normal.        Behavior: Behavior normal.        Thought Content: Thought content does not include homicidal or suicidal ideation.     RECENT LABS AND TESTS: BMET    Component Value Date/Time   NA 137 11/18/2017 1051   NA 140 09/10/2017 0923   K 4.1 11/18/2017 1051   CL 104 11/18/2017 1051   CO2 24 11/18/2017 1051   GLUCOSE 89 11/18/2017 1051   BUN 21 (H) 11/18/2017 1051   BUN 11 09/10/2017 0923   CREATININE 0.87 11/18/2017 1051   CALCIUM 9.6 11/18/2017 1051   GFRNONAA >60 11/18/2017 1051   GFRAA  >60 11/18/2017 1051   Lab Results  Component Value Date   HGBA1C 6.0 (H) 11/18/2017  HGBA1C 6.1 (H) 09/10/2017   HGBA1C 5.8 (H) 08/13/2016   Lab Results  Component Value Date   INSULIN 27.5 (H) 09/10/2017   CBC    Component Value Date/Time   WBC 8.6 11/18/2017 1051   RBC 4.67 11/18/2017 1051   HGB 13.9 11/18/2017 1051   HGB 13.1 09/10/2017 0923   HCT 43.8 11/18/2017 1051   HCT 39.2 09/10/2017 0923   PLT 361 11/18/2017 1051   MCV 93.8 11/18/2017 1051   MCV 89 09/10/2017 0923   MCH 29.8 11/18/2017 1051   MCHC 31.7 11/18/2017 1051   RDW 13.5 11/18/2017 1051   RDW 13.2 09/10/2017 0923   LYMPHSABS 1.9 11/18/2017 1051   LYMPHSABS 1.4 09/10/2017 0923   MONOABS 0.7 11/18/2017 1051   EOSABS 0.2 11/18/2017 1051   EOSABS 0.3 09/10/2017 0923   BASOSABS 0.1 11/18/2017 1051   BASOSABS 0.0 09/10/2017 0923   Iron/TIBC/Ferritin/ %Sat No results found for: IRON, TIBC, FERRITIN, IRONPCTSAT Lipid Panel     Component Value Date/Time   CHOL 171 09/10/2017 0923   TRIG 105 09/10/2017 0923   HDL 57 09/10/2017 0923   LDLCALC 93 09/10/2017 0923   Hepatic Function Panel     Component Value Date/Time   PROT 7.0 09/10/2017 0923   ALBUMIN 4.4 09/10/2017 0923   AST 25 09/10/2017 0923   ALT 26 09/10/2017 0923   ALKPHOS 82 09/10/2017 0923   BILITOT 0.3 09/10/2017 0923      Component Value Date/Time   TSH 3.390 09/10/2017 0923    Ref. Range 09/10/2017 09:23  Vitamin D, 25-Hydroxy Latest Ref Range: 30.0 - 100.0 ng/mL 46.3     OBESITY BEHAVIORAL INTERVENTION VISIT  Today's visit was # 7   Starting weight: 241 lbs Starting date: 09/10/2017 Today's weight : 212 lbs Today's date: 01/05/2018 Total lbs lost to date: 16   ASK: We discussed the diagnosis of obesity with Bo Mcclintock today and Cola agreed to give [place] permission to discuss obesity behavioral modification therapy today.  ASSESS: Alessia has the diagnosis of obesity and her BMI today is 36.37 Karina is in the action  stage of change   ADVISE: Aljean was educated on the multiple health risks of obesity as well as the benefit of weight loss to improve her health. She was advised of the need for long term treatment and the importance of lifestyle modifications to improve her current health and to decrease her risk of future health problems.  AGREE: Multiple dietary modification options and treatment options were discussed and  Iline agreed to follow the recommendations documented in the above note.  ARRANGE: Azelia was educated on the importance of frequent visits to treat obesity as outlined per CMS and USPSTF guidelines and agreed to schedule her next follow up appointment today.  Corey Skains, am acting as Location manager for [employer]. Owens Shark, DO  I have reviewed the above documentation for accuracy and completeness, and I agree with the above. -Jearld Lesch, DO

## 2018-01-16 ENCOUNTER — Encounter (INDEPENDENT_AMBULATORY_CARE_PROVIDER_SITE_OTHER): Payer: Self-pay | Admitting: Orthopedic Surgery

## 2018-01-16 ENCOUNTER — Ambulatory Visit (INDEPENDENT_AMBULATORY_CARE_PROVIDER_SITE_OTHER): Payer: 59 | Admitting: Orthopedic Surgery

## 2018-01-16 VITALS — Ht 64.0 in | Wt 212.0 lb

## 2018-01-16 DIAGNOSIS — M12812 Other specific arthropathies, not elsewhere classified, left shoulder: Secondary | ICD-10-CM | POA: Diagnosis not present

## 2018-01-18 ENCOUNTER — Encounter (INDEPENDENT_AMBULATORY_CARE_PROVIDER_SITE_OTHER): Payer: Self-pay | Admitting: Orthopedic Surgery

## 2018-01-18 NOTE — Progress Notes (Signed)
Office Visit Note   Patient: Crystal Roberts           Date of Birth: Dec 01, 1955           MRN: [id number] Visit Date: 01/16/2018 Requested by: Sharilyn Sites, MD [address] [place], [place] [address] PCP: Sharilyn Sites, MD  Subjective: Chief Complaint  Patient presents with  . Left Shoulder - Pain    HPI: [name] is a patient with left shoulder pain.  She is had pain for 4 months.  Denies any history of injury.  Reports pain at night as well.  She works at [employer] in [employer] which is not physical.  She does report some loss of strength.  She is right-hand dominant.  She is had an MRI scan which shows supraspinatus tear with retraction just lateral to the glenoid.  She also has a subscap tear with retraction to the glenoid rim.              ROS: All systems reviewed are negative as they relate to the chief complaint within the history of present illness.  Patient denies  fevers or chills.   Assessment & Plan: Visit Diagnoses:  1. Rotator cuff arthropathy of left shoulder     Plan: Impression is retracted rotator cuff tears left shoulder.  This is a very difficult situation for [name] he was 63 years old.  The amount of retraction of these rotator cuff tears makes it highly unlikely that a successful repair can be performed.  I gave her the odds of about 25% that we could get the supraspinatus tendon back down and attached.  Very unlikely that that subscapularis could be mobilized and reattached to the lesser tuberosity.  For that reason I think her best option is to wait this out until she has diminished function and pain and then proceed with reverse shoulder replacement.  She states that she does not want to undergo surgery if it is not going to help.  I think that there is a more likely than not chance that an attempt at rotator cuff repair would not be possible in her case.  Patient understands the risk and benefits of surgical intervention as well as observation.  Due to the less  than 466% certainty of achieving a favorable outcome with rotator cuff repair she is going to wait this out.  I will see her back as needed.  Follow-Up Instructions: Return if symptoms worsen or fail to improve.   Orders:  No orders of the defined types were placed in this encounter.  No orders of the defined types were placed in this encounter.     Procedures: No procedures performed   Clinical Data: No additional findings.  Objective: Vital Signs: Ht 5\' 4"  (1.626 m)   Wt 212 lb (96.2 kg)   BMI 36.39 kg/m   Physical Exam:   Constitutional: Patient appears well-developed HEENT:  Head: Normocephalic Eyes:EOM are normal Neck: Normal range of motion Cardiovascular: Normal rate Pulmonary/chest: Effort normal Neurologic: Patient is alert Skin: Skin is warm Psychiatric: Patient has normal mood and affect    Ortho Exam: Ortho exam demonstrates good cervical spine range of motion.  She has about 90 degrees of forward flexion on the left and about 70 degrees of abduction on the left.  She does have some subscap weakness to belly press testing.  Also a little bit of weakness to abduction on the left-hand side.  No other masses lymphadenopathy or skin changes noted in that left  shoulder girdle region.  There is some coarse grinding and crepitus with passive range of motion of the shoulder.  Deltoid is functional.  Specialty Comments:  No specialty comments available.  Imaging: No results found.   PMFS History: Patient Active Problem List   Diagnosis Date Noted  . Sebaceous cyst   . Class 2 severe obesity with serious comorbidity and body mass index (BMI) of 38.0 to 38.9 in adult ([place]) 10/27/2017  . Other fatigue 09/10/2017  . Shortness of breath on exertion 09/10/2017  . Type 2 diabetes mellitus without complication, without long-term current use of insulin ([place]) 09/10/2017  . Essential hypertension 09/10/2017  . Other hyperlipidemia 09/10/2017  . Other specified  hypothyroidism 09/10/2017  . Herniated cervical disc 08/16/2016   Past Medical History:  Diagnosis Date  . Anxiety   . Arthritis    neck- cervical disc herniation   . Asthma    as child  . Back pain   . Cancer ([place])    skin - low back , insitu  melanoma   . Constipation   . Depression   . Diabetes mellitus without complication ([place])   . Dyspnea   . Gallbladder problem   . GERD (gastroesophageal reflux disease)   . H/O cardiovascular stress test 1990   pt. reports that she was having a lot of stress & she was seen by cardiac- had stress test & told that it was negative   . Hyperlipidemia   . Hypertension   . Hypothyroidism   . Leg edema   . Pulmonary embolism ([place]) [place]. at [facility]- on blood thinning med., thought to be related to lack of activity, smoking & BCP    Family History  Problem Relation Age of Onset  . Heart disease Mother   . Hypertension Mother   . Hyperlipidemia Mother   . Cancer Father        melenoma  . Diabetes Sister   . Stroke Brother   . Heart disease Brother   . Diabetes Paternal Grandmother   . Diabetes Sister   . Cancer Sister        kidney; had kidney removed    Past Surgical History:  Procedure Laterality Date  . ANTERIOR CERVICAL DECOMP/DISCECTOMY FUSION N/A 08/16/2016   Procedure: Cervical Four-Five Anterior cervical decompression/discectomy/fusion;  Surgeon: Erline Levine, MD;  Location: [facility];  Service: Neurosurgery;  Laterality: N/A;  C4-5 Anterior cervical decompression/discectomy/fusion  . BACK SURGERY     sebaceous cyst removed  . CHOLECYSTECTOMY  0960   umbilical hernia ------ followed cholecystectomy  . CYSTECTOMY Bilateral    cyst on ovaries  . HERNIA REPAIR     umbilical hernia repair   . MASS EXCISION N/A 11/21/2017   Procedure: EXCISION SEBACEOUS CYST ON BACK;  Surgeon: Aviva Signs, MD;  Location: [facility];  Service: General;  Laterality: N/A;  . TUBAL LIGATION     Social History   Occupational History  . Occupation:  Administrator, sports  Tobacco Use  . Smoking status: Former Smoker    Packs/day: 1.00    Years: 10.00    Pack years: 10.00    Types: Cigarettes    Last attempt to quit: 08/14/1986    Years since quitting: 31.4  . Smokeless tobacco: Never Used  Substance and Sexual Activity  . Alcohol use: Yes    Comment: very rare   . Drug use: No  . Sexual activity: Yes    Birth control/protection: Post-menopausal, Surgical  Comment: tubal

## 2018-01-19 ENCOUNTER — Encounter (INDEPENDENT_AMBULATORY_CARE_PROVIDER_SITE_OTHER): Payer: Self-pay | Admitting: Orthopedic Surgery

## 2018-01-20 NOTE — Telephone Encounter (Signed)
We would need to get thin cut ct first - would take 3 - 4 from now pls clala thx the ct is for patient specific instrumentation

## 2018-01-22 ENCOUNTER — Ambulatory Visit (INDEPENDENT_AMBULATORY_CARE_PROVIDER_SITE_OTHER): Payer: 59 | Admitting: Bariatrics

## 2018-01-22 ENCOUNTER — Encounter (INDEPENDENT_AMBULATORY_CARE_PROVIDER_SITE_OTHER): Payer: Self-pay | Admitting: Bariatrics

## 2018-01-22 VITALS — BP 122/75 | HR 67 | Temp 98.0°F | Ht 64.0 in | Wt 211.0 lb

## 2018-01-22 DIAGNOSIS — E119 Type 2 diabetes mellitus without complications: Secondary | ICD-10-CM

## 2018-01-22 DIAGNOSIS — I10 Essential (primary) hypertension: Secondary | ICD-10-CM | POA: Diagnosis not present

## 2018-01-22 DIAGNOSIS — F3289 Other specified depressive episodes: Secondary | ICD-10-CM | POA: Diagnosis not present

## 2018-01-22 DIAGNOSIS — Z6836 Body mass index (BMI) 36.0-36.9, adult: Secondary | ICD-10-CM

## 2018-01-22 DIAGNOSIS — E66812 Morbid (severe) obesity due to excess calories: Secondary | ICD-10-CM

## 2018-01-26 NOTE — Progress Notes (Signed)
Office: [phone]  /  Fax: [phone]   HPI:   Chief Complaint: OBESITY Crystal Roberts is here to discuss her progress with her obesity treatment plan. She is on the Category 2 plan and is following her eating plan approximately 90 to 95 % of the time. She states she is walking 30 minutes 2 times per week. Crystal Roberts is doing well. She did not struggle during the holidays. Her weight is 211 lb (95.7 kg) today and has had a weight loss of 1 pound over a period of 2 weeks since her last visit. She has lost 30 lbs since starting treatment with [place].  Diabetes II Crystal Roberts has a diagnosis of diabetes type II. Crystal Roberts states fasting BGs range between 100 and 110 and 2 hour post prandial BGs range between 90 and 100 and denies any hypoglycemic episodes. Last A1c was at 6.0 She has been working on intensive lifestyle modifications including diet, exercise, and weight loss to help control her blood glucose levels.  Hypertension Crystal Roberts is a 63 y.o. female with hypertension. She is taking Losartan-HCTZ. Bo Mcclintock denies chest pain or shortness of breath on exertion. She is working weight loss to help control her blood pressure with the goal of decreasing her risk of heart attack and stroke. [name] blood pressure is well controlled.  Depression with emotional eating behaviors Crystal Roberts is struggling with emotional eating and using food for comfort to the extent that it is negatively impacting her health. She often snacks when she is not hungry. Crystal Roberts sometimes feels she is out of control and then feels guilty that she made poor food choices. She is currently taking Bupropion. She has been working on behavior modification techniques to help reduce her emotional eating and has been somewhat successful. She shows no sign of suicidal or homicidal ideations.  Depression screen [facility] 2/9 09/10/2017 07/11/2017  Decreased Interest 2 0  Down, Depressed, Hopeless 1 0  PHQ - 2 Score 3 0  Altered sleeping 1 -  Tired,  decreased energy 2 -  Change in appetite 1 -  Feeling bad or failure about yourself  1 -  Trouble concentrating 1 -  Moving slowly or fidgety/restless 0 -  Suicidal thoughts 0 -  PHQ-9 Score 9 -  Difficult doing work/chores Not difficult at all -       ASSESSMENT AND PLAN:  Type 2 diabetes mellitus without complication, without long-term current use of insulin (HCC)  Essential hypertension  Other depression - with emotional eating  Class 2 severe obesity with serious comorbidity and body mass index (BMI) of 36.0 to 36.9 in adult, unspecified obesity type ([place])  PLAN:  Diabetes II [name] has been given extensive diabetes education by myself today including ideal fasting and post-prandial blood glucose readings, individual ideal Hgb A1c goals and hypoglycemia prevention. We discussed the importance of good blood sugar control to decrease the likelihood of diabetic complications such as nephropathy, neuropathy, limb loss, blindness, coronary artery disease, and death. We discussed the importance of intensive lifestyle modification including diet, exercise and weight loss as the first line treatment for diabetes. [name] agrees to continue her diabetes medications and will follow up at the agreed upon time.  Hypertension We discussed sodium restriction, working on healthy weight loss, and a regular exercise program as the means to achieve improved blood pressure control. [name] agreed with this plan and agreed to follow up as directed. We will continue to monitor her blood pressure as well as her progress with  the above lifestyle modifications. She will continue her medications as prescribed and will watch for signs of hypotension as she continues her lifestyle modifications.  Depression with Emotional Eating Behaviors We discussed behavior modification techniques today to help Crystal Roberts deal with her emotional eating and depression. She has agreed to take Wellbutrin SR 300 mg qd and follow up as  directed.  I spent > than 50% of the 15 minute visit on counseling as documented in the note.  Obesity Crystal Roberts is currently in the action stage of change. As such, her goal is to continue with weight loss efforts She has agreed to follow the Category 2 plan Crystal Roberts has been instructed to work up to a goal of 150 minutes of combined cardio and strengthening exercise per week for weight loss and overall health benefits. We discussed the following Behavioral Modification Strategies today: increase H2O intake, keeping healthy foods in the home, better snacking choices, increasing lean protein intake, decreasing simple carbohydrates, increasing vegetables and work on meal planning and easy cooking plans New options for protein were given to patient today.  Crystal Roberts has agreed to follow up with our clinic in 4 weeks. She was informed of the importance of frequent follow up visits to maximize her success with intensive lifestyle modifications for her multiple health conditions.  ALLERGIES: Allergies  Allergen Reactions  . Sulfa Antibiotics Itching and Rash    MEDICATIONS: Current Outpatient Medications on File Prior to Visit  Medication Sig Dispense Refill  . aspirin 81 MG tablet Take 81 mg by mouth daily.    Marland Kitchen buPROPion (WELLBUTRIN XL) 300 MG 24 hr tablet Take 300 mg by mouth daily.    Marland Kitchen CINNAMON PO Take 500 mg by mouth at bedtime.     Marland Kitchen levothyroxine (SYNTHROID, LEVOTHROID) 75 MCG tablet Take 75 mcg by mouth daily before breakfast.    . losartan-hydrochlorothiazide (HYZAAR) 100-25 MG per tablet Take 1 tablet by mouth daily.    . metFORMIN (GLUCOPHAGE-XR) 750 MG 24 hr tablet Take 750 mg by mouth 2 (two) times daily with a meal.    . Multiple Vitamin (MULTIVITAMIN) tablet Take 1 tablet by mouth daily.    Vladimir Faster Glycol-Propyl Glycol (LUBRICANT EYE DROPS) 0.4-0.3 % SOLN Place 1 drop into both eyes 3 (three) times daily as needed (for itchy/dry eyes.).    Marland Kitchen simvastatin (ZOCOR) 40 MG tablet Take 40  mg by mouth at bedtime.      No current facility-administered medications on file prior to visit.     PAST MEDICAL HISTORY: Past Medical History:  Diagnosis Date  . Anxiety   . Arthritis    neck- cervical disc herniation   . Asthma    as child  . Back pain   . Cancer ([place])    skin - low back , insitu  melanoma   . Constipation   . Depression   . Diabetes mellitus without complication ([place])   . Dyspnea   . Gallbladder problem   . GERD (gastroesophageal reflux disease)   . H/O cardiovascular stress test 1990   pt. reports that she was having a lot of stress & she was seen by cardiac- had stress test & told that it was negative   . Hyperlipidemia   . Hypertension   . Hypothyroidism   . Leg edema   . Pulmonary embolism ([place]) [place]. at [facility]- on blood thinning med., thought to be related to lack of activity, smoking & BCP    PAST SURGICAL HISTORY:  Past Surgical History:  Procedure Laterality Date  . ANTERIOR CERVICAL DECOMP/DISCECTOMY FUSION N/A 08/16/2016   Procedure: Cervical Four-Five Anterior cervical decompression/discectomy/fusion;  Surgeon: Erline Levine, MD;  Location: [facility];  Service: Neurosurgery;  Laterality: N/A;  C4-5 Anterior cervical decompression/discectomy/fusion  . BACK SURGERY     sebaceous cyst removed  . CHOLECYSTECTOMY  8416   umbilical hernia ------ followed cholecystectomy  . CYSTECTOMY Bilateral    cyst on ovaries  . HERNIA REPAIR     umbilical hernia repair   . MASS EXCISION N/A 11/21/2017   Procedure: EXCISION SEBACEOUS CYST ON BACK;  Surgeon: Aviva Signs, MD;  Location: [facility];  Service: General;  Laterality: N/A;  . TUBAL LIGATION      SOCIAL HISTORY: Social History   Tobacco Use  . Smoking status: Former Smoker    Packs/day: 1.00    Years: 10.00    Pack years: 10.00    Types: Cigarettes    Last attempt to quit: 08/14/1986    Years since quitting: 31.4  . Smokeless tobacco: Never Used  Substance Use Topics  . Alcohol use: Yes     Comment: very rare   . Drug use: No    FAMILY HISTORY: Family History  Problem Relation Age of Onset  . Heart disease Mother   . Hypertension Mother   . Hyperlipidemia Mother   . Cancer Father        melenoma  . Diabetes Sister   . Stroke Brother   . Heart disease Brother   . Diabetes Paternal Grandmother   . Diabetes Sister   . Cancer Sister        kidney; had kidney removed    ROS: Review of Systems  Constitutional: Positive for weight loss.  Respiratory: Negative for shortness of breath (on exertion).   Cardiovascular: Negative for chest pain.  Endo/Heme/Allergies:       Negative for hypoglycemia   Psychiatric/Behavioral: Positive for depression. Negative for suicidal ideas.    PHYSICAL EXAM: Blood pressure 122/75, pulse 67, temperature 98 F (36.7 C), temperature source Oral, height 5\' 4"  (1.626 m), weight 211 lb (95.7 kg), SpO2 97 %. Body mass index is 36.22 kg/m. Physical Exam Vitals signs reviewed.  Constitutional:      Appearance: Normal appearance. She is well-developed. She is obese.  Cardiovascular:     Rate and Rhythm: Normal rate.  Pulmonary:     Effort: Pulmonary effort is normal.  Musculoskeletal: Normal range of motion.  Skin:    General: Skin is warm and dry.  Neurological:     Mental Status: She is alert and oriented to person, place, and time.  Psychiatric:        Mood and Affect: Mood normal.        Behavior: Behavior normal.        Thought Content: Thought content does not include homicidal or suicidal ideation.     RECENT LABS AND TESTS: BMET    Component Value Date/Time   NA 137 11/18/2017 1051   NA 140 09/10/2017 0923   K 4.1 11/18/2017 1051   CL 104 11/18/2017 1051   CO2 24 11/18/2017 1051   GLUCOSE 89 11/18/2017 1051   BUN 21 (H) 11/18/2017 1051   BUN 11 09/10/2017 0923   CREATININE 0.87 11/18/2017 1051   CALCIUM 9.6 11/18/2017 1051   GFRNONAA >60 11/18/2017 1051   GFRAA >60 11/18/2017 1051   Lab Results  Component  Value Date   HGBA1C 6.0 (H) 11/18/2017   HGBA1C  6.1 (H) 09/10/2017   HGBA1C 5.8 (H) 08/13/2016   Lab Results  Component Value Date   INSULIN 27.5 (H) 09/10/2017   CBC    Component Value Date/Time   WBC 8.6 11/18/2017 1051   RBC 4.67 11/18/2017 1051   HGB 13.9 11/18/2017 1051   HGB 13.1 09/10/2017 0923   HCT 43.8 11/18/2017 1051   HCT 39.2 09/10/2017 0923   PLT 361 11/18/2017 1051   MCV 93.8 11/18/2017 1051   MCV 89 09/10/2017 0923   MCH 29.8 11/18/2017 1051   MCHC 31.7 11/18/2017 1051   RDW 13.5 11/18/2017 1051   RDW 13.2 09/10/2017 0923   LYMPHSABS 1.9 11/18/2017 1051   LYMPHSABS 1.4 09/10/2017 0923   MONOABS 0.7 11/18/2017 1051   EOSABS 0.2 11/18/2017 1051   EOSABS 0.3 09/10/2017 0923   BASOSABS 0.1 11/18/2017 1051   BASOSABS 0.0 09/10/2017 0923   Iron/TIBC/Ferritin/ %Sat No results found for: IRON, TIBC, FERRITIN, IRONPCTSAT Lipid Panel     Component Value Date/Time   CHOL 171 09/10/2017 0923   TRIG 105 09/10/2017 0923   HDL 57 09/10/2017 0923   LDLCALC 93 09/10/2017 0923   Hepatic Function Panel     Component Value Date/Time   PROT 7.0 09/10/2017 0923   ALBUMIN 4.4 09/10/2017 0923   AST 25 09/10/2017 0923   ALT 26 09/10/2017 0923   ALKPHOS 82 09/10/2017 0923   BILITOT 0.3 09/10/2017 0923      Component Value Date/Time   TSH 3.390 09/10/2017 0923     Ref. Range 09/10/2017 09:23  Vitamin D, 25-Hydroxy Latest Ref Range: 30.0 - 100.0 ng/mL 46.3     OBESITY BEHAVIORAL INTERVENTION VISIT  Today's visit was # 8   Starting weight: 241 lbs Starting date: 09/10/2017 Today's weight : 211 lbs Today's date: 01/22/2018 Total lbs lost to date: 33   ASK: We discussed the diagnosis of obesity with Bo Mcclintock today and Bobbye agreed to give [place] permission to discuss obesity behavioral modification therapy today.  ASSESS: Arla has the diagnosis of obesity and her BMI today is 36.2 Jakeisha is in the action stage of change   ADVISE: Brennen was educated on  the multiple health risks of obesity as well as the benefit of weight loss to improve her health. She was advised of the need for long term treatment and the importance of lifestyle modifications to improve her current health and to decrease her risk of future health problems.  AGREE: Multiple dietary modification options and treatment options were discussed and  Marylon agreed to follow the recommendations documented in the above note.  ARRANGE: Phyliss was educated on the importance of frequent visits to treat obesity as outlined per CMS and USPSTF guidelines and agreed to schedule her next follow up appointment today.  Corey Skains, am acting as Location manager for [employer]. Owens Shark, DO  I have reviewed the above documentation for accuracy and completeness, and I agree with the above. -Jearld Lesch, DO

## 2018-02-19 ENCOUNTER — Ambulatory Visit (INDEPENDENT_AMBULATORY_CARE_PROVIDER_SITE_OTHER): Payer: 59 | Admitting: Bariatrics

## 2018-02-23 MED FILL — SIMVASTATIN 40 MG TABLET: 40 | 90 days supply | Qty: 90 | Fill #3

## 2018-02-23 MED FILL — LOSARTAN-HCTZ 100-25 MG TAB: 100-25 | 90 days supply | Qty: 90 | Fill #0

## 2018-02-23 MED FILL — metFORMIN HCL 500 MG TABS: 500 | 30 days supply | Qty: 120 | Fill #3

## 2018-02-23 MED FILL — LEVOTHYROXINE 75 MCG TABLET: 75 | 90 days supply | Qty: 90 | Fill #0

## 2018-03-11 ENCOUNTER — Other Ambulatory Visit (HOSPITAL_COMMUNITY): Payer: Self-pay | Admitting: Family Medicine

## 2018-03-11 DIAGNOSIS — Z1231 Encounter for screening mammogram for malignant neoplasm of breast: Secondary | ICD-10-CM

## 2018-03-12 ENCOUNTER — Ambulatory Visit (INDEPENDENT_AMBULATORY_CARE_PROVIDER_SITE_OTHER): Payer: Commercial Managed Care - PPO | Admitting: Bariatrics

## 2018-03-26 ENCOUNTER — Encounter (INDEPENDENT_AMBULATORY_CARE_PROVIDER_SITE_OTHER): Payer: Self-pay | Admitting: Bariatrics

## 2018-03-26 ENCOUNTER — Ambulatory Visit (INDEPENDENT_AMBULATORY_CARE_PROVIDER_SITE_OTHER): Payer: 59 | Admitting: Bariatrics

## 2018-03-26 ENCOUNTER — Other Ambulatory Visit: Payer: Self-pay

## 2018-03-26 VITALS — BP 134/69 | HR 67 | Temp 98.0°F | Ht 64.0 in | Wt 206.0 lb

## 2018-03-26 DIAGNOSIS — I10 Essential (primary) hypertension: Secondary | ICD-10-CM | POA: Diagnosis not present

## 2018-03-26 DIAGNOSIS — F3289 Other specified depressive episodes: Secondary | ICD-10-CM | POA: Diagnosis not present

## 2018-03-26 DIAGNOSIS — E119 Type 2 diabetes mellitus without complications: Secondary | ICD-10-CM

## 2018-03-26 DIAGNOSIS — Z6835 Body mass index (BMI) 35.0-35.9, adult: Secondary | ICD-10-CM | POA: Diagnosis not present

## 2018-03-30 MED FILL — buPROPion HCL ER (XL) 300 M: 300 | 90 days supply | Qty: 90 | Fill #0

## 2018-03-30 NOTE — Progress Notes (Signed)
Office: [phone]  /  Fax: [phone]   HPI:   Chief Complaint: OBESITY Crystal Roberts is here to discuss her progress with her obesity treatment plan. She is on the Category 2 plan and is following her eating plan approximately 50 % of the time. She states she is walking 30 minutes 2 times per week. Crystal Roberts's husband is in the hospital and she has been staying at the hospital. She has been trying to eat good. Her weight is 211 lb (95.7 kg) today and has had a weight loss of 5 pounds over a period of 9 weeks since her last visit. She has lost 35 lbs since starting treatment with [place].  Diabetes II Crystal Roberts has a diagnosis of diabetes type II. Crystal Roberts states fasting BGs range between 100 and 110 and she is not checking 2 hour post prandial BGs. Crystal Roberts is currently taking metformin. Last A1c was at 6.0 She has been working on intensive lifestyle modifications including diet, exercise, and weight loss to help control her blood glucose levels.  Hypertension Crystal Roberts is a 63 y.o. female with hypertension. She is taking Losartan-HCTZ. Bo Mcclintock denies chest pain. She is working weight loss to help control her blood pressure with the goal of decreasing her risk of heart attack and stroke. Crystal Roberts blood pressure is well controlled.  Depression with emotional eating behaviors [name]'s depression and emotional eating has increased with stress. She struggles with emotional eating and using food for comfort to the extent that it is negatively impacting her health. She often snacks when she is not hungry. Breeona sometimes feels she is out of control and then feels guilty that she made poor food choices. [name] is taking Wellbutrin. She has been working on behavior modification techniques to help reduce her emotional eating and has been somewhat successful. She shows no sign of suicidal or homicidal ideations.  Depression screen Crystal Roberts 2/9 09/10/2017 07/11/2017  Decreased Interest 2 0  Down, Depressed, Hopeless 1 0   PHQ - 2 Score 3 0  Altered sleeping 1 -  Tired, decreased energy 2 -  Change in appetite 1 -  Feeling bad or failure about yourself  1 -  Trouble concentrating 1 -  Moving slowly or fidgety/restless 0 -  Suicidal thoughts 0 -  PHQ-9 Score 9 -  Difficult doing work/chores Not difficult at all -     ASSESSMENT AND PLAN:  Type 2 diabetes mellitus without complication, without long-term current use of insulin (HCC)  Essential hypertension  Other depression - with emotional eating  Class 2 severe obesity with serious comorbidity and body mass index (BMI) of 35.0 to 35.9 in adult, unspecified obesity type ([place])  PLAN:  Diabetes II Kassity has been given extensive diabetes education by myself today including ideal fasting and post-prandial blood glucose readings, individual ideal Hgb A1c goals and hypoglycemia prevention. We discussed the importance of good blood sugar control to decrease the likelihood of diabetic complications such as nephropathy, neuropathy, limb loss, blindness, coronary artery disease, and death. We discussed the importance of intensive lifestyle modification including diet, exercise and weight loss as the first line treatment for diabetes. [name] agrees to continue metformin and follow up at the agreed upon time.  Hypertension We discussed sodium restriction, working on healthy weight loss, and a regular exercise program as the means to achieve improved blood pressure control. Crystal Roberts agreed with this plan and agreed to follow up as directed. We will continue to monitor her blood pressure as well as  her progress with the above lifestyle modifications. She will continue her medications as prescribed and will watch for signs of hypotension as she continues her lifestyle modifications.  Depression with Emotional Eating Behaviors We discussed behavior modification techniques today to help Crystal Roberts deal with her emotional eating and depression. She will continue Wellbutrin XL 300  mg daily and follow up as directed.  I spent > than 50% of the 15 minute visit on counseling as documented in the note.  Obesity Crystal Roberts is currently in the action stage of change. As such, her goal is to continue with weight loss efforts She has agreed to follow the Category 2 plan [name] has been instructed to work up to a goal of 150 minutes of combined cardio and strengthening exercise per week for weight loss and overall health benefits. We discussed the following Behavioral Modification Strategies today: increase H2O intake and keep water up to 64+ ounces daily, no skipping meals, keeping healthy foods in the home, increasing lean protein intake, decreasing simple carbohydrates, increasing vegetables, decrease eating out, work on meal planning and easy cooking plans, ways to avoid boredom eating and decrease liquid calories  Crystal Roberts has agreed to follow up with our clinic in 4 weeks. She was informed of the importance of frequent follow up visits to maximize her success with intensive lifestyle modifications for her multiple health conditions.  ALLERGIES: Allergies  Allergen Reactions  . Sulfa Antibiotics Itching and Rash    MEDICATIONS: Current Outpatient Medications on File Prior to Visit  Medication Sig Dispense Refill  . aspirin 81 MG tablet Take 81 mg by mouth daily.    Crystal Roberts buPROPion (WELLBUTRIN XL) 300 MG 24 hr tablet Take 300 mg by mouth daily.    Crystal Roberts CINNAMON PO Take 500 mg by mouth at bedtime.     Crystal Roberts levothyroxine (SYNTHROID, LEVOTHROID) 75 MCG tablet Take 75 mcg by mouth daily before breakfast.    . losartan-hydrochlorothiazide (HYZAAR) 100-25 MG per tablet Take 1 tablet by mouth daily.    . metFORMIN (GLUCOPHAGE-XR) 750 MG 24 hr tablet Take 750 mg by mouth 2 (two) times daily with a meal.    . Multiple Vitamin (MULTIVITAMIN) tablet Take 1 tablet by mouth daily.    Crystal Roberts-Propyl Roberts (LUBRICANT EYE DROPS) 0.4-0.3 % SOLN Place 1 drop into both eyes 3 (three) times  daily as needed (for itchy/dry eyes.).    Crystal Roberts simvastatin (ZOCOR) 40 MG tablet Take 40 mg by mouth at bedtime.      No current facility-administered medications on file prior to visit.     PAST MEDICAL HISTORY: Past Medical History:  Diagnosis Date  . Anxiety   . Arthritis    neck- cervical disc herniation   . Asthma    as child  . Back pain   . Cancer ([place])    skin - low back , insitu  melanoma   . Constipation   . Depression   . Diabetes mellitus without complication ([place])   . Dyspnea   . Gallbladder problem   . GERD (gastroesophageal reflux disease)   . H/O cardiovascular stress test 1990   pt. reports that she was having a lot of stress & she was seen by cardiac- had stress test & told that it was negative   . Hyperlipidemia   . Hypertension   . Hypothyroidism   . Leg edema   . Pulmonary embolism ([place]) [place]. at [facility]- on blood thinning med., thought to be related to lack  of activity, smoking & BCP    PAST SURGICAL HISTORY: Past Surgical History:  Procedure Laterality Date  . ANTERIOR CERVICAL DECOMP/DISCECTOMY FUSION N/A 08/16/2016   Procedure: Cervical Four-Five Anterior cervical decompression/discectomy/fusion;  Surgeon: Erline Levine, MD;  Location: [place];  Service: Neurosurgery;  Laterality: N/A;  C4-5 Anterior cervical decompression/discectomy/fusion  . BACK SURGERY     sebaceous cyst removed  . CHOLECYSTECTOMY  6644   umbilical hernia ------ followed cholecystectomy  . CYSTECTOMY Bilateral    cyst on ovaries  . HERNIA REPAIR     umbilical hernia repair   . MASS EXCISION N/A 11/21/2017   Procedure: EXCISION SEBACEOUS CYST ON BACK;  Surgeon: Aviva Signs, MD;  Location: [facility];  Service: General;  Laterality: N/A;  . TUBAL LIGATION      SOCIAL HISTORY: Social History   Tobacco Use  . Smoking status: Former Smoker    Packs/day: 1.00    Years: 10.00    Pack years: 10.00    Types: Cigarettes    Last attempt to quit: 08/14/1986    Years since quitting:  31.6  . Smokeless tobacco: Never Used  Substance Use Topics  . Alcohol use: Yes    Comment: very rare   . Drug use: No    FAMILY HISTORY: Family History  Problem Relation Age of Onset  . Heart disease Mother   . Hypertension Mother   . Hyperlipidemia Mother   . Cancer Father        melenoma  . Diabetes Sister   . Stroke Brother   . Heart disease Brother   . Diabetes Paternal Grandmother   . Diabetes Sister   . Cancer Sister        kidney; had kidney removed    ROS: Review of Systems  Constitutional: Positive for weight loss.  Cardiovascular: Negative for chest pain.  Psychiatric/Behavioral: Positive for depression. Negative for suicidal ideas.       Positive for Stress    PHYSICAL EXAM: Blood pressure 134/69, pulse 67, temperature 98 F (36.7 C), temperature source Oral, height 5\' 4"  (1.626 m), weight 211 lb (95.7 kg), SpO2 100 %. Body mass index is 36.22 kg/m. Physical Exam Vitals signs reviewed.  Constitutional:      Appearance: Normal appearance. She is well-developed. She is obese.  Cardiovascular:     Rate and Rhythm: Normal rate.  Pulmonary:     Effort: Pulmonary effort is normal.  Musculoskeletal: Normal range of motion.  Skin:    General: Skin is warm and dry.  Neurological:     Mental Status: She is alert and oriented to person, place, and time.  Psychiatric:        Mood and Affect: Mood normal.        Behavior: Behavior normal.        Thought Content: Thought content does not include homicidal or suicidal ideation.     RECENT LABS AND TESTS: BMET    Component Value Date/Time   NA 137 11/18/2017 1051   NA 140 09/10/2017 0923   K 4.1 11/18/2017 1051   CL 104 11/18/2017 1051   CO2 24 11/18/2017 1051   GLUCOSE 89 11/18/2017 1051   BUN 21 (H) 11/18/2017 1051   BUN 11 09/10/2017 0923   CREATININE 0.87 11/18/2017 1051   CALCIUM 9.6 11/18/2017 1051   GFRNONAA >60 11/18/2017 1051   GFRAA >60 11/18/2017 1051   Lab Results  Component Value  Date   HGBA1C 6.0 (H) 11/18/2017   HGBA1C 6.1 (H)  09/10/2017   HGBA1C 5.8 (H) 08/13/2016   Lab Results  Component Value Date   INSULIN 27.5 (H) 09/10/2017   CBC    Component Value Date/Time   WBC 8.6 11/18/2017 1051   RBC 4.67 11/18/2017 1051   HGB 13.9 11/18/2017 1051   HGB 13.1 09/10/2017 0923   HCT 43.8 11/18/2017 1051   HCT 39.2 09/10/2017 0923   PLT 361 11/18/2017 1051   MCV 93.8 11/18/2017 1051   MCV 89 09/10/2017 0923   MCH 29.8 11/18/2017 1051   MCHC 31.7 11/18/2017 1051   RDW 13.5 11/18/2017 1051   RDW 13.2 09/10/2017 0923   LYMPHSABS 1.9 11/18/2017 1051   LYMPHSABS 1.4 09/10/2017 0923   MONOABS 0.7 11/18/2017 1051   EOSABS 0.2 11/18/2017 1051   EOSABS 0.3 09/10/2017 0923   BASOSABS 0.1 11/18/2017 1051   BASOSABS 0.0 09/10/2017 0923   Iron/TIBC/Ferritin/ %Sat No results found for: IRON, TIBC, FERRITIN, IRONPCTSAT Lipid Panel     Component Value Date/Time   CHOL 171 09/10/2017 0923   TRIG 105 09/10/2017 0923   HDL 57 09/10/2017 0923   LDLCALC 93 09/10/2017 0923   Hepatic Function Panel     Component Value Date/Time   PROT 7.0 09/10/2017 0923   ALBUMIN 4.4 09/10/2017 0923   AST 25 09/10/2017 0923   ALT 26 09/10/2017 0923   ALKPHOS 82 09/10/2017 0923   BILITOT 0.3 09/10/2017 0923      Component Value Date/Time   TSH 3.390 09/10/2017 0923     Ref. Range 09/10/2017 09:23  Vitamin D, 25-Hydroxy Latest Ref Range: 30.0 - 100.0 ng/mL 46.3     OBESITY BEHAVIORAL INTERVENTION VISIT  Today's visit was # 9   Starting weight: 241 lbs Starting date: 09/10/2017 Today's weight : 206 lbs Today's date: 03/26/2018 Total lbs lost to date: 35    03/26/2018  Height 5\' 4"  (1.626 m)  Weight 206 lb (93.4 kg)  BMI (Calculated) 35.34  BLOOD PRESSURE - SYSTOLIC 892  BLOOD PRESSURE - DIASTOLIC 69   Body Fat % 46 %  Total Body Water (lbs) 84.8 lbs    ASK: We discussed the diagnosis of obesity with Bo Mcclintock today and Taryne agreed to give [place] permission to  discuss obesity behavioral modification therapy today.  ASSESS: Gladie has the diagnosis of obesity and her BMI today is  Dellar is in the action stage of change   ADVISE: Akeisha was educated on the multiple health risks of obesity as well as the benefit of weight loss to improve her health. She was advised of the need for long term treatment and the importance of lifestyle modifications to improve her current health and to decrease her risk of future health problems.  AGREE: Multiple dietary modification options and treatment options were discussed and  Aristea agreed to follow the recommendations documented in the above note.  ARRANGE: Luan was educated on the importance of frequent visits to treat obesity as outlined per CMS and USPSTF guidelines and agreed to schedule her next follow up appointment today.  Corey Skains, am acting as Location manager for [employer]. Owens Shark, DO  I have reviewed the above documentation for accuracy and completeness, and I agree with the above. -Jearld Lesch, DO

## 2018-04-03 DIAGNOSIS — M25512 Pain in left shoulder: Secondary | ICD-10-CM | POA: Diagnosis not present

## 2018-04-08 ENCOUNTER — Encounter (INDEPENDENT_AMBULATORY_CARE_PROVIDER_SITE_OTHER): Payer: Self-pay

## 2018-04-23 ENCOUNTER — Ambulatory Visit (INDEPENDENT_AMBULATORY_CARE_PROVIDER_SITE_OTHER): Payer: 59 | Admitting: Bariatrics

## 2018-04-23 ENCOUNTER — Encounter (INDEPENDENT_AMBULATORY_CARE_PROVIDER_SITE_OTHER): Payer: Self-pay | Admitting: Bariatrics

## 2018-04-23 ENCOUNTER — Other Ambulatory Visit: Payer: Self-pay

## 2018-04-23 DIAGNOSIS — E119 Type 2 diabetes mellitus without complications: Secondary | ICD-10-CM

## 2018-04-23 DIAGNOSIS — Z6835 Body mass index (BMI) 35.0-35.9, adult: Secondary | ICD-10-CM

## 2018-04-23 DIAGNOSIS — F3289 Other specified depressive episodes: Secondary | ICD-10-CM

## 2018-04-27 NOTE — Progress Notes (Signed)
Office: [phone]  /  Fax: [phone] TeleHealth Visit:  Crystal Roberts has verbally consented to this TeleHealth visit today. The patient is located at work, the provider is located at the [employer] [facility] office. The participants in this visit include the listed provider and patient and any and all parties involved. The visit was conducted today via telephone. Crystal Roberts was unable to use realtime audiovisual technology today (tried Skype) and the telehealth visit was conducted via telephone.   HPI:   Chief Complaint: OBESITY Crystal Roberts is here to discuss her progress with her obesity treatment plan. She is on the Category 2 plan and is following her eating plan approximately 50 % of the time. She states she is exercising 0 minutes 0 times per week. Crystal Roberts thinks that she is the same weight. Her husband has been in Hospice care since 04/04/18. We were unable to weigh the patient today for this TeleHealth visit. She feels as if she has maintained weight since her last visit. She has lost 35 lbs since starting treatment with [place].  Diabetes II Crystal Roberts has a diagnosis of diabetes type II. Crystal Roberts states fasting BGs range between 90 and 100 (well controlled) and she denies any hypoglycemic episodes. Last A1c was at 6.0 She has been working on intensive lifestyle modifications including diet, exercise, and weight loss to help control her blood glucose levels.  Depression with emotional eating behaviors Crystal Roberts is struggling with emotional eating and using food for comfort to the extent that it is negatively impacting her health. She often snacks when she is not hungry. Crystal Roberts sometimes feels she is out of control and then feels guilty that she made poor food choices. She has been working on behavior modification techniques to help reduce her emotional eating and has been somewhat successful. Crystal Roberts is taking Wellbutrin per her other provider. She shows no sign of suicidal or homicidal ideations.   Depression screen [facility] 2/9 09/10/2017 07/11/2017  Decreased Interest 2 0  Down, Depressed, Hopeless 1 0  PHQ - 2 Score 3 0  Altered sleeping 1 -  Tired, decreased energy 2 -  Change in appetite 1 -  Feeling bad or failure about yourself  1 -  Trouble concentrating 1 -  Moving slowly or fidgety/restless 0 -  Suicidal thoughts 0 -  PHQ-9 Score 9 -  Difficult doing work/chores Not difficult at all -    ASSESSMENT AND PLAN:  Type 2 diabetes mellitus without complication, without long-term current use of insulin (HCC)  Other depression - with emotional eating  Class 2 severe obesity with serious comorbidity and body mass index (BMI) of 35.0 to 35.9 in adult, unspecified obesity type ([place])  PLAN:  Diabetes II Crystal Roberts has been given extensive diabetes education by myself today including ideal fasting and post-prandial blood glucose readings, individual ideal Hgb A1c goals and hypoglycemia prevention. We discussed the importance of good blood sugar control to decrease the likelihood of diabetic complications such as nephropathy, neuropathy, limb loss, blindness, coronary artery disease, and death. We discussed the importance of intensive lifestyle modification including diet, exercise and weight loss as the first line treatment for diabetes. Crystal Roberts will continue metformin and follow up as directed.  Depression with Emotional Eating Behaviors We discussed behavior modification techniques today to help Crystal Roberts deal with her emotional eating and depression. She will continue Wellbutrin and follow up as directed.  Obesity Crystal Roberts is currently in the action stage of change. As such, her goal is to continue with weight  loss efforts She has agreed to follow the Category 2 plan Crystal Roberts has been instructed to work up to a goal of 150 minutes of combined cardio and strengthening exercise per week for weight loss and overall health benefits. We discussed the following Behavioral Modification Strategies today:  increase H2O intake, no skipping meals, keeping healthy foods in the home, increasing lean protein intake, decreasing simple carbohydrates, increasing vegetables, decrease eating out and work on meal planning and easy cooking plans Crystal Roberts will weigh herself at home.  Crystal Roberts has agreed to follow up with our clinic in 6 weeks. She was informed of the importance of frequent follow up visits to maximize her success with intensive lifestyle modifications for her multiple health conditions.  ALLERGIES: Allergies  Allergen Reactions  . Sulfa Antibiotics Itching and Rash    MEDICATIONS: Current Outpatient Medications on File Prior to Visit  Medication Sig Dispense Refill  . aspirin 81 MG tablet Take 81 mg by mouth daily.    Marland Kitchen buPROPion (WELLBUTRIN XL) 300 MG 24 hr tablet Take 300 mg by mouth daily.    Marland Kitchen CINNAMON PO Take 500 mg by mouth at bedtime.     Marland Kitchen levothyroxine (SYNTHROID, LEVOTHROID) 75 MCG tablet Take 75 mcg by mouth daily before breakfast.    . losartan-hydrochlorothiazide (HYZAAR) 100-25 MG per tablet Take 1 tablet by mouth daily.    . metFORMIN (GLUCOPHAGE-XR) 750 MG 24 hr tablet Take 750 mg by mouth 2 (two) times daily with a meal.    . Multiple Vitamin (MULTIVITAMIN) tablet Take 1 tablet by mouth daily.    Vladimir Faster Glycol-Propyl Glycol (LUBRICANT EYE DROPS) 0.4-0.3 % SOLN Place 1 drop into both eyes 3 (three) times daily as needed (for itchy/dry eyes.).    Marland Kitchen simvastatin (ZOCOR) 40 MG tablet Take 40 mg by mouth at bedtime.      No current facility-administered medications on file prior to visit.     PAST MEDICAL HISTORY: Past Medical History:  Diagnosis Date  . Anxiety   . Arthritis    neck- cervical disc herniation   . Asthma    as child  . Back pain   . Cancer ([place])    skin - low back , insitu  melanoma   . Constipation   . Depression   . Diabetes mellitus without complication ([place])   . Dyspnea   . Gallbladder problem   . GERD (gastroesophageal reflux disease)   .  H/O cardiovascular stress test 1990   pt. reports that she was having a lot of stress & she was seen by cardiac- had stress test & told that it was negative   . Hyperlipidemia   . Hypertension   . Hypothyroidism   . Leg edema   . Pulmonary embolism ([place]) [place]. at [facility]- on blood thinning med., thought to be related to lack of activity, smoking & BCP    PAST SURGICAL HISTORY: Past Surgical History:  Procedure Laterality Date  . ANTERIOR CERVICAL DECOMP/DISCECTOMY FUSION N/A 08/16/2016   Procedure: Cervical Four-Five Anterior cervical decompression/discectomy/fusion;  Surgeon: Erline Levine, MD;  Location: [place];  Service: Neurosurgery;  Laterality: N/A;  C4-5 Anterior cervical decompression/discectomy/fusion  . BACK SURGERY     sebaceous cyst removed  . CHOLECYSTECTOMY  8101   umbilical hernia ------ followed cholecystectomy  . CYSTECTOMY Bilateral    cyst on ovaries  . HERNIA REPAIR     umbilical hernia repair   . MASS EXCISION N/A 11/21/2017   Procedure: EXCISION SEBACEOUS  CYST ON BACK;  Surgeon: Aviva Signs, MD;  Location: [facility];  Service: General;  Laterality: N/A;  . TUBAL LIGATION      SOCIAL HISTORY: Social History   Tobacco Use  . Smoking status: Former Smoker    Packs/day: 1.00    Years: 10.00    Pack years: 10.00    Types: Cigarettes    Last attempt to quit: 08/14/1986    Years since quitting: 31.7  . Smokeless tobacco: Never Used  Substance Use Topics  . Alcohol use: Yes    Comment: very rare   . Drug use: No    FAMILY HISTORY: Family History  Problem Relation Age of Onset  . Heart disease Mother   . Hypertension Mother   . Hyperlipidemia Mother   . Cancer Father        melenoma  . Diabetes Sister   . Stroke Brother   . Heart disease Brother   . Diabetes Paternal Grandmother   . Diabetes Sister   . Cancer Sister        kidney; had kidney removed    ROS: Review of Systems  Constitutional: Negative for weight loss.  Endo/Heme/Allergies:        Negative for hypoglycemia  Psychiatric/Behavioral: Positive for depression. Negative for suicidal ideas.    PHYSICAL EXAM: Pt in no acute distress  RECENT LABS AND TESTS: BMET    Component Value Date/Time   NA 137 11/18/2017 1051   NA 140 09/10/2017 0923   K 4.1 11/18/2017 1051   CL 104 11/18/2017 1051   CO2 24 11/18/2017 1051   GLUCOSE 89 11/18/2017 1051   BUN 21 (H) 11/18/2017 1051   BUN 11 09/10/2017 0923   CREATININE 0.87 11/18/2017 1051   CALCIUM 9.6 11/18/2017 1051   GFRNONAA >60 11/18/2017 1051   GFRAA >60 11/18/2017 1051   Lab Results  Component Value Date   HGBA1C 6.0 (H) 11/18/2017   HGBA1C 6.1 (H) 09/10/2017   HGBA1C 5.8 (H) 08/13/2016   Lab Results  Component Value Date   INSULIN 27.5 (H) 09/10/2017   CBC    Component Value Date/Time   WBC 8.6 11/18/2017 1051   RBC 4.67 11/18/2017 1051   HGB 13.9 11/18/2017 1051   HGB 13.1 09/10/2017 0923   HCT 43.8 11/18/2017 1051   HCT 39.2 09/10/2017 0923   PLT 361 11/18/2017 1051   MCV 93.8 11/18/2017 1051   MCV 89 09/10/2017 0923   MCH 29.8 11/18/2017 1051   MCHC 31.7 11/18/2017 1051   RDW 13.5 11/18/2017 1051   RDW 13.2 09/10/2017 0923   LYMPHSABS 1.9 11/18/2017 1051   LYMPHSABS 1.4 09/10/2017 0923   MONOABS 0.7 11/18/2017 1051   EOSABS 0.2 11/18/2017 1051   EOSABS 0.3 09/10/2017 0923   BASOSABS 0.1 11/18/2017 1051   BASOSABS 0.0 09/10/2017 0923   Iron/TIBC/Ferritin/ %Sat No results found for: IRON, TIBC, FERRITIN, IRONPCTSAT Lipid Panel     Component Value Date/Time   CHOL 171 09/10/2017 0923   TRIG 105 09/10/2017 0923   HDL 57 09/10/2017 0923   LDLCALC 93 09/10/2017 0923   Hepatic Function Panel     Component Value Date/Time   PROT 7.0 09/10/2017 0923   ALBUMIN 4.4 09/10/2017 0923   AST 25 09/10/2017 0923   ALT 26 09/10/2017 0923   ALKPHOS 82 09/10/2017 0923   BILITOT 0.3 09/10/2017 0923      Component Value Date/Time   TSH 3.390 09/10/2017 0923     Ref. Range 09/10/2017 09:23  Vitamin D, 25-Hydroxy Latest Ref Range: 30.0 - 100.0 ng/mL 46.3    I, Doreene Nest, am acting as Location manager for [employer]. Owens Shark, DO  I have reviewed the above documentation for accuracy and completeness, and I agree with the above. -Jearld Lesch, DO

## 2018-04-28 MED FILL — metFORMIN HCL 500 MG TABS: 500 | 15 days supply | Qty: 60 | Fill #0

## 2018-04-29 MED FILL — MELOXICAM 15 MG TABLET: 15 | 90 days supply | Qty: 90 | Fill #0

## 2018-05-01 ENCOUNTER — Ambulatory Visit (HOSPITAL_COMMUNITY): Payer: 59

## 2018-05-15 MED FILL — FREESTYLE LITE TEST STRIP: 50 days supply | Qty: 100 | Fill #0

## 2018-05-29 DIAGNOSIS — Z1389 Encounter for screening for other disorder: Secondary | ICD-10-CM | POA: Diagnosis not present

## 2018-05-29 DIAGNOSIS — E6609 Other obesity due to excess calories: Secondary | ICD-10-CM | POA: Diagnosis not present

## 2018-05-29 DIAGNOSIS — E063 Autoimmune thyroiditis: Secondary | ICD-10-CM | POA: Diagnosis not present

## 2018-05-29 DIAGNOSIS — Z6835 Body mass index (BMI) 35.0-35.9, adult: Secondary | ICD-10-CM | POA: Diagnosis not present

## 2018-05-29 DIAGNOSIS — E7849 Other hyperlipidemia: Secondary | ICD-10-CM | POA: Diagnosis not present

## 2018-05-29 DIAGNOSIS — E119 Type 2 diabetes mellitus without complications: Secondary | ICD-10-CM | POA: Diagnosis not present

## 2018-05-30 MED FILL — metFORMIN HCL 500 MG TABS: 500 | 90 days supply | Qty: 180 | Fill #0

## 2018-05-30 MED FILL — FLUoxetine HCL 20 MG CAPS: 20 | 90 days supply | Qty: 90 | Fill #0

## 2018-06-03 MED FILL — SIMVASTATIN 40 MG TABLET: 40 | 90 days supply | Qty: 90 | Fill #0

## 2018-06-03 MED FILL — LEVOTHYROXINE 75 MCG TABLET: 75 | 90 days supply | Qty: 90 | Fill #0

## 2018-06-04 ENCOUNTER — Encounter (INDEPENDENT_AMBULATORY_CARE_PROVIDER_SITE_OTHER): Payer: Self-pay

## 2018-06-04 ENCOUNTER — Ambulatory Visit (INDEPENDENT_AMBULATORY_CARE_PROVIDER_SITE_OTHER): Payer: 59 | Admitting: Bariatrics

## 2018-06-04 MED FILL — LOSARTAN-HCTZ 100-25 MG TAB: 100-25 | 90 days supply | Qty: 90 | Fill #0

## 2018-06-09 ENCOUNTER — Encounter (INDEPENDENT_AMBULATORY_CARE_PROVIDER_SITE_OTHER): Payer: Self-pay | Admitting: Bariatrics

## 2018-06-09 NOTE — Telephone Encounter (Signed)
Please advise 

## 2018-07-03 ENCOUNTER — Ambulatory Visit (HOSPITAL_COMMUNITY): Payer: 59

## 2018-07-07 MED FILL — buPROPion HCL ER (XL) 300 M: 300 | 90 days supply | Qty: 90 | Fill #1

## 2018-07-09 ENCOUNTER — Other Ambulatory Visit: Payer: Self-pay

## 2018-07-09 ENCOUNTER — Encounter (HOSPITAL_COMMUNITY): Payer: Self-pay

## 2018-07-09 ENCOUNTER — Ambulatory Visit (HOSPITAL_COMMUNITY)
Admission: RE | Admit: 2018-07-09 | Discharge: 2018-07-09 | Disposition: A | Discharge status: Home / Self Care | Payer: 59 | Source: Ambulatory Visit | Attending: Family Medicine | Admitting: Family Medicine

## 2018-07-09 DIAGNOSIS — Z1231 Encounter for screening mammogram for malignant neoplasm of breast: Secondary | ICD-10-CM | POA: Diagnosis not present

## 2018-07-30 MED FILL — MELOXICAM 15 MG TABLET: 15 | 90 days supply | Qty: 90 | Fill #0

## 2018-08-26 MED FILL — metFORMIN HCL 500 MG TABS: 500 | 90 days supply | Qty: 180 | Fill #0

## 2018-08-26 MED FILL — FLUoxetine HCL 20 MG CAPS: 20 | 90 days supply | Qty: 90 | Fill #0

## 2018-09-10 MED FILL — SIMVASTATIN 40 MG TABLET: 40 | 90 days supply | Qty: 90 | Fill #0

## 2018-09-16 MED FILL — LEVOTHYROXINE 75 MCG TABLET: 75 | 90 days supply | Qty: 90 | Fill #0

## 2018-09-22 MED FILL — LOSARTAN-HCTZ 100-25 MG TAB: 100-25 | 30 days supply | Qty: 30 | Fill #0

## 2018-10-15 ENCOUNTER — Ambulatory Visit (INDEPENDENT_AMBULATORY_CARE_PROVIDER_SITE_OTHER): Payer: 59 | Admitting: Bariatrics

## 2018-10-26 MED FILL — MELOXICAM 15 MG TABLET: 15 | 90 days supply | Qty: 90 | Fill #0

## 2018-10-26 MED FILL — LOSARTAN-HCTZ 100-25 MG TAB: 100-25 | 90 days supply | Qty: 90 | Fill #0

## 2018-11-10 MED FILL — metFORMIN HCL 500 MG TABS: 500 | 30 days supply | Qty: 60 | Fill #0

## 2018-11-30 DIAGNOSIS — E7849 Other hyperlipidemia: Secondary | ICD-10-CM | POA: Diagnosis not present

## 2018-11-30 DIAGNOSIS — E119 Type 2 diabetes mellitus without complications: Secondary | ICD-10-CM | POA: Diagnosis not present

## 2018-11-30 DIAGNOSIS — Z6835 Body mass index (BMI) 35.0-35.9, adult: Secondary | ICD-10-CM | POA: Diagnosis not present

## 2018-11-30 DIAGNOSIS — E063 Autoimmune thyroiditis: Secondary | ICD-10-CM | POA: Diagnosis not present

## 2018-11-30 DIAGNOSIS — E6609 Other obesity due to excess calories: Secondary | ICD-10-CM | POA: Diagnosis not present

## 2018-11-30 MED FILL — FUROSEMIDE 40 MG TAB: 40 | 90 days supply | Qty: 90 | Fill #0

## 2018-11-30 MED FILL — valACYclovir HCL 1 GM TABS: 1 | 7 days supply | Qty: 21 | Fill #0

## 2018-11-30 MED FILL — FLUoxetine HCL 20 MG CAPS: 20 | 90 days supply | Qty: 90 | Fill #0

## 2018-12-15 MED FILL — LEVOTHYROXINE 75 MCG TABLET: 75 | 90 days supply | Qty: 90 | Fill #1

## 2018-12-15 MED FILL — metFORMIN HCL 500 MG TABS: 500 | 90 days supply | Qty: 90 | Fill #0

## 2018-12-21 MED FILL — SIMVASTATIN 40 MG TABLET: 40 | 90 days supply | Qty: 90 | Fill #1

## 2018-12-31 ENCOUNTER — Other Ambulatory Visit: Payer: Self-pay

## 2018-12-31 ENCOUNTER — Encounter (INDEPENDENT_AMBULATORY_CARE_PROVIDER_SITE_OTHER): Payer: Self-pay | Admitting: Bariatrics

## 2018-12-31 ENCOUNTER — Ambulatory Visit (INDEPENDENT_AMBULATORY_CARE_PROVIDER_SITE_OTHER): Payer: 59 | Admitting: Bariatrics

## 2018-12-31 VITALS — BP 126/75 | HR 61 | Temp 98.9°F | Ht 64.0 in | Wt 209.0 lb

## 2018-12-31 DIAGNOSIS — E66812 Obesity, class 2: Secondary | ICD-10-CM

## 2018-12-31 DIAGNOSIS — I10 Essential (primary) hypertension: Secondary | ICD-10-CM | POA: Diagnosis not present

## 2018-12-31 DIAGNOSIS — E119 Type 2 diabetes mellitus without complications: Secondary | ICD-10-CM

## 2018-12-31 DIAGNOSIS — Z6835 Body mass index (BMI) 35.0-35.9, adult: Secondary | ICD-10-CM

## 2019-01-04 ENCOUNTER — Encounter (INDEPENDENT_AMBULATORY_CARE_PROVIDER_SITE_OTHER): Payer: Self-pay | Admitting: Bariatrics

## 2019-01-04 NOTE — Progress Notes (Signed)
Office: [phone]  /  Fax: [phone]   HPI:  Chief Complaint: OBESITY Crystal Roberts is here to discuss her progress with her obesity treatment plan. She is on the Category 2 plan and states she is following her eating plan approximately 0% of the time. She states she is exercising 0 minutes 0 times per week.  Crystal Roberts was last seen on 04/23/2018. She is under a lot of stress. She reports doing well with her water intake and states she is getting adequate protein.  Today's visit was #11 Starting weight: 241 lbs Starting date: 09/10/2017 Today's weight: 209 lbs Today's date: 12/31/2018 Total lbs lost to date: 32 Total lbs lost since last in-office visit: 0  Type II Diabetes Mellitus without Complications Crystal Roberts has a diagnosis of type II diabetes mellitus without complications. Last A1c was reported to be 5.4.  Hypertension Crystal Roberts has a diagnosis of hypertension, which is well controlled. She is taking Hyzaar.  ASSESSMENT AND PLAN:  Type 2 diabetes mellitus without complication, without long-term current use of insulin (HCC)  Essential hypertension  Class 2 severe obesity with serious comorbidity and body mass index (BMI) of 35.0 to 35.9 in adult, unspecified obesity type ([place])  PLAN:  Type II Diabetes Mellitus without Complications Crystal Roberts has been given diabetes education by myself today. Good blood sugar control is important to decrease the likelihood of diabetic complications such as nephropathy, neuropathy, limb loss, blindness, coronary artery disease, and death. Intensive lifestyle modification including diet, exercise and weight loss were discussed as the first line treatment for diabetes. Lilias will continue taking 1/2 tablet of metformin BID and will decrease her carbohydrates.  Hypertension Crystal Roberts is working on healthy weight loss and exercise to improve blood pressure control. She will continue her medications. We will watch for signs of hypotension as she continues her  lifestyle modifications.  Obesity Crystal Roberts is currently in the action stage of change. As such, her goal is to continue with weight loss efforts. She has agreed to follow the Category 2 plan alternating with the Pescatarian plan. Crystal Roberts will work on meal planning, intentional eating, and will watch her portions. Crystal Roberts states she has been active at home and at work. We discussed the following Behavioral Modification Strategies today: increasing lean protein intake, decreasing simple carbohydrates, increasing vegetables, increase H20 intake, decrease eating out, no skipping meals, work on meal planning and easy cooking plans, keeping healthy foods in the home, and planning for success.  Crystal Roberts has agreed to follow-up with our clinic in 3 weeks. She was informed of the importance of frequent follow-up visits to maximize her success with intensive lifestyle modifications for her multiple health conditions.  ALLERGIES: Allergies  Allergen Reactions  . Sulfa Antibiotics Itching and Rash    MEDICATIONS: Current Outpatient Medications on File Prior to Visit  Medication Sig Dispense Refill  . aspirin 81 MG tablet Take 81 mg by mouth daily.    Marland Kitchen buPROPion (WELLBUTRIN XL) 300 MG 24 hr tablet Take 300 mg by mouth daily.    Marland Kitchen CINNAMON PO Take 500 mg by mouth at bedtime.     Marland Kitchen FLUoxetine (PROZAC) 20 MG tablet Take 20 mg by mouth daily.    Marland Kitchen levothyroxine (SYNTHROID, LEVOTHROID) 75 MCG tablet Take 75 mcg by mouth daily before breakfast.    . losartan-hydrochlorothiazide (HYZAAR) 100-25 MG per tablet Take 1 tablet by mouth daily.    . metFORMIN (GLUCOPHAGE) 500 MG tablet Take 500 mg by mouth 2 (two) times daily with a meal. Take 1/2  tablet daily    . Multiple Vitamin (MULTIVITAMIN) tablet Take 1 tablet by mouth daily.    Vladimir Faster Glycol-Propyl Glycol (LUBRICANT EYE DROPS) 0.4-0.3 % SOLN Place 1 drop into both eyes 3 (three) times daily as needed (for itchy/dry eyes.).    Marland Kitchen simvastatin (ZOCOR) 40 MG  tablet Take 40 mg by mouth at bedtime.      No current facility-administered medications on file prior to visit.    PAST MEDICAL HISTORY: Past Medical History:  Diagnosis Date  . Anxiety   . Arthritis    neck- cervical disc herniation   . Asthma    as child  . Back pain   . Cancer ([place])    skin - low back , insitu  melanoma   . Constipation   . Depression   . Diabetes mellitus without complication ([place])   . Dyspnea   . Gallbladder problem   . GERD (gastroesophageal reflux disease)   . H/O cardiovascular stress test 1990   pt. reports that she was having a lot of stress & she was seen by cardiac- had stress test & told that it was negative   . Hyperlipidemia   . Hypertension   . Hypothyroidism   . Leg edema   . Pulmonary embolism ([place]) [place]. at [facility]- on blood thinning med., thought to be related to lack of activity, smoking & BCP    PAST SURGICAL HISTORY: Past Surgical History:  Procedure Laterality Date  . ANTERIOR CERVICAL DECOMP/DISCECTOMY FUSION N/A 08/16/2016   Procedure: Cervical Four-Five Anterior cervical decompression/discectomy/fusion;  Surgeon: Erline Levine, MD;  Location: [facility];  Service: Neurosurgery;  Laterality: N/A;  C4-5 Anterior cervical decompression/discectomy/fusion  . BACK SURGERY     sebaceous cyst removed  . CHOLECYSTECTOMY  [id number]   umbilical hernia ------ followed cholecystectomy  . CYSTECTOMY Bilateral    cyst on ovaries  . HERNIA REPAIR     umbilical hernia repair   . MASS EXCISION N/A 11/21/2017   Procedure: EXCISION SEBACEOUS CYST ON BACK;  Surgeon: Aviva Signs, MD;  Location: [facility];  Service: General;  Laterality: N/A;  . TUBAL LIGATION      SOCIAL HISTORY: Social History   Tobacco Use  . Smoking status: Former Smoker    Packs/day: 1.00    Years: 10.00    Pack years: 10.00    Types: Cigarettes    Quit date: 08/14/1986    Years since quitting: 32.4  . Smokeless tobacco: Never Used  Substance Use Topics  . Alcohol use: Yes     Comment: very rare   . Drug use: No    FAMILY HISTORY: Family History  Problem Relation Age of Onset  . Heart disease Mother   . Hypertension Mother   . Hyperlipidemia Mother   . Cancer Father        melenoma  . Diabetes Sister   . Stroke Brother   . Heart disease Brother   . Diabetes Paternal Grandmother   . Diabetes Sister   . Cancer Sister        kidney; had kidney removed   ROS: Review of Systems  Constitutional: Negative for weight loss.   PHYSICAL EXAM: Blood pressure 126/75, pulse 61, temperature 98.9 F (37.2 C), height 5\' 4"  (1.626 m), weight 209 lb (94.8 kg), SpO2 97 %. Body mass index is 35.87 kg/m. Physical Exam Vitals reviewed.  Constitutional:      Appearance: Normal appearance. She is obese.  Cardiovascular:  Rate and Rhythm: Normal rate.     Pulses: Normal pulses.  Pulmonary:     Effort: Pulmonary effort is normal.     Breath sounds: Normal breath sounds.  Musculoskeletal:        General: Normal range of motion.  Skin:    General: Skin is warm and dry.  Neurological:     Mental Status: She is alert and oriented to person, place, and time.  Psychiatric:        Behavior: Behavior normal.   RECENT LABS AND TESTS: BMET    Component Value Date/Time   NA 137 11/18/2017 1051   NA 140 09/10/2017 0923   K 4.1 11/18/2017 1051   CL 104 11/18/2017 1051   CO2 24 11/18/2017 1051   GLUCOSE 89 11/18/2017 1051   BUN 21 (H) 11/18/2017 1051   BUN 11 09/10/2017 0923   CREATININE 0.87 11/18/2017 1051   CALCIUM 9.6 11/18/2017 1051   GFRNONAA >60 11/18/2017 1051   GFRAA >60 11/18/2017 1051   Lab Results  Component Value Date   HGBA1C 6.0 (H) 11/18/2017   HGBA1C 6.1 (H) 09/10/2017   HGBA1C 5.8 (H) 08/13/2016   Lab Results  Component Value Date   INSULIN 27.5 (H) 09/10/2017   CBC    Component Value Date/Time   WBC 8.6 11/18/2017 1051   RBC 4.67 11/18/2017 1051   HGB 13.9 11/18/2017 1051   HGB 13.1 09/10/2017 0923   HCT 43.8 11/18/2017  1051   HCT 39.2 09/10/2017 0923   PLT 361 11/18/2017 1051   MCV 93.8 11/18/2017 1051   MCV 89 09/10/2017 0923   MCH 29.8 11/18/2017 1051   MCHC 31.7 11/18/2017 1051   RDW 13.5 11/18/2017 1051   RDW 13.2 09/10/2017 0923   LYMPHSABS 1.9 11/18/2017 1051   LYMPHSABS 1.4 09/10/2017 0923   MONOABS 0.7 11/18/2017 1051   EOSABS 0.2 11/18/2017 1051   EOSABS 0.3 09/10/2017 0923   BASOSABS 0.1 11/18/2017 1051   BASOSABS 0.0 09/10/2017 0923   Iron/TIBC/Ferritin/ %Sat No results found for: IRON, TIBC, FERRITIN, IRONPCTSAT Lipid Panel     Component Value Date/Time   CHOL 171 09/10/2017 0923   TRIG 105 09/10/2017 0923   HDL 57 09/10/2017 0923   LDLCALC 93 09/10/2017 0923   Hepatic Function Panel     Component Value Date/Time   PROT 7.0 09/10/2017 0923   ALBUMIN 4.4 09/10/2017 0923   AST 25 09/10/2017 0923   ALT 26 09/10/2017 0923   ALKPHOS 82 09/10/2017 0923   BILITOT 0.3 09/10/2017 0923      Component Value Date/Time   TSH 3.390 09/10/2017 0923    OBESITY BEHAVIORAL INTERVENTION VISIT DOCUMENTATION FOR INSURANCE (~15 minutes)  I, Michaelene Song, am acting as Location manager for [employer], DO  I have reviewed the above documentation for accuracy and completeness, and I agree with the above. Jearld Lesch, DO

## 2019-01-20 ENCOUNTER — Ambulatory Visit (INDEPENDENT_AMBULATORY_CARE_PROVIDER_SITE_OTHER): Payer: 59 | Admitting: Bariatrics

## 2019-01-26 ENCOUNTER — Ambulatory Visit (INDEPENDENT_AMBULATORY_CARE_PROVIDER_SITE_OTHER): Payer: 59 | Admitting: Bariatrics

## 2019-01-26 MED FILL — MELOXICAM 15 MG TABLET: 15 | 90 days supply | Qty: 90 | Fill #0

## 2019-01-26 MED FILL — buPROPion HCL ER (XL) 300 M: 300 | 90 days supply | Qty: 90 | Fill #1

## 2019-01-28 ENCOUNTER — Ambulatory Visit (INDEPENDENT_AMBULATORY_CARE_PROVIDER_SITE_OTHER): Payer: 59 | Admitting: Bariatrics

## 2019-01-28 ENCOUNTER — Encounter (INDEPENDENT_AMBULATORY_CARE_PROVIDER_SITE_OTHER): Payer: Self-pay | Admitting: Bariatrics

## 2019-01-28 ENCOUNTER — Other Ambulatory Visit: Payer: Self-pay

## 2019-01-28 VITALS — BP 147/48 | HR 59 | Temp 98.4°F | Ht 64.0 in | Wt 213.0 lb

## 2019-01-28 DIAGNOSIS — E119 Type 2 diabetes mellitus without complications: Secondary | ICD-10-CM

## 2019-01-28 DIAGNOSIS — E7849 Other hyperlipidemia: Secondary | ICD-10-CM

## 2019-01-28 DIAGNOSIS — Z6836 Body mass index (BMI) 36.0-36.9, adult: Secondary | ICD-10-CM

## 2019-01-29 ENCOUNTER — Encounter (INDEPENDENT_AMBULATORY_CARE_PROVIDER_SITE_OTHER): Payer: Self-pay | Admitting: Bariatrics

## 2019-02-01 ENCOUNTER — Encounter (INDEPENDENT_AMBULATORY_CARE_PROVIDER_SITE_OTHER): Payer: Self-pay | Admitting: Bariatrics

## 2019-02-01 NOTE — Progress Notes (Signed)
Chief Complaint:   OBESITY ZORIANNA MCARTOR is here to discuss her progress with her obesity treatment plan along with follow-up of her obesity related diagnoses. Bernardina is on the Category 2 Plan and states she is following her eating plan approximately 60% of the time. Jaqueline states she is walking 20 minutes 1-2 times per week.  Today's visit was #: 12 Starting weight: 241 lbs Starting date: 09/10/2017 Today's weight: 213 lbs Today's date: 02/01/2019 Total lbs lost to date: 28  Total lbs lost since last in-office visit: 0  Interim History: Paigelyn is up 4 lbs, but has done well overall. She is under more stress. She has had more carbohydrates.  Subjective:   Type 2 diabetes mellitus without complication, without long-term current use of insulin ([place]). This is well controlled. She states fasting blood sugars range between 90 and 110.  Lab Results  Component Value Date   HGBA1C 6.0 (H) 11/18/2017   HGBA1C 6.1 (H) 09/10/2017   HGBA1C 5.8 (H) 08/13/2016   Lab Results  Component Value Date   LDLCALC 93 09/10/2017   CREATININE 0.87 11/18/2017   Other hyperlipidemia. Arayna has hyperlipidemia and has been trying to improve her cholesterol levels with intensive lifestyle modification including a low saturated fat diet, exercise and weight loss. She denies any chest pain, claudication or myalgias. She is taking Zocor.  Lab Results  Component Value Date   ALT 26 09/10/2017   AST 25 09/10/2017   ALKPHOS 82 09/10/2017   BILITOT 0.3 09/10/2017   Lab Results  Component Value Date   CHOL 171 09/10/2017   HDL 57 09/10/2017   LDLCALC 93 09/10/2017   TRIG 105 09/10/2017   Assessment/Plan:   Type 2 diabetes mellitus without complication, without long-term current use of insulin ([place]). Good blood sugar control is important to decrease the likelihood of diabetic complications such as nephropathy, neuropathy, limb loss, blindness, coronary artery disease, and death. Intensive lifestyle  modification including diet, exercise and weight loss are the first line of treatment for diabetes. Livana will continue the plan, decrease carbohydrates, and increase protein and healthy fats to control hunger and prevent hypoglycemia.  Other hyperlipidemia. Cardiovascular risk and specific lipid/LDL goals reviewed.  We discussed several lifestyle modifications today and Matylda will continue to work on diet, exercise and weight loss efforts. Orders and follow up as documented in patient record. She will decrease saturated fats and increase PUFA's and MUFA's.  Counseling Intensive lifestyle modifications are the first line treatment for this issue. . Dietary changes: Increase soluble fiber. Decrease simple carbohydrates. . Exercise changes: Moderate to vigorous-intensity aerobic activity 150 minutes per week if tolerated. . Lipid-lowering medications: see documented in medical record.  Class 2 severe obesity with serious comorbidity and body mass index (BMI) of 36.0 to 36.9 in adult, unspecified obesity type ([place]).  Thurza is currently in the action stage of change. As such, her goal is to continue with weight loss efforts. She has agreed to the Category 2 Plan alternating with the Pescatarian plan.  She will work on meal planning and will be more adherent to the plan.  Exercise goals: Mariann will continue to walk.  Behavioral modification strategies: increasing lean protein intake, decreasing simple carbohydrates, increasing vegetables, increasing water intake, decreasing eating out, no skipping meals, meal planning and cooking strategies, keeping healthy foods in the home and planning for success.  Harshini has agreed to follow-up with our clinic in 6 weeks. She was informed of the importance of frequent  follow-up visits to maximize her success with intensive lifestyle modifications for her multiple health conditions.   Objective:   Blood pressure (!) 147/48, pulse (!) 59, temperature 98.4 F  (36.9 C), height 5\' 4"  (1.626 m), weight 213 lb (96.6 kg), SpO2 96 %. Body mass index is 36.56 kg/m.  General: Cooperative, alert, well developed, in no acute distress. HEENT: Conjunctivae and lids unremarkable. Cardiovascular: Regular rhythm.  Lungs: Normal work of breathing. Neurologic: No focal deficits.   Lab Results  Component Value Date   CREATININE 0.87 11/18/2017   BUN 21 (H) 11/18/2017   NA 137 11/18/2017   K 4.1 11/18/2017   CL 104 11/18/2017   CO2 24 11/18/2017   Lab Results  Component Value Date   ALT 26 09/10/2017   AST 25 09/10/2017   ALKPHOS 82 09/10/2017   BILITOT 0.3 09/10/2017   Lab Results  Component Value Date   HGBA1C 6.0 (H) 11/18/2017   HGBA1C 6.1 (H) 09/10/2017   HGBA1C 5.8 (H) 08/13/2016   Lab Results  Component Value Date   INSULIN 27.5 (H) 09/10/2017   Lab Results  Component Value Date   TSH 3.390 09/10/2017   Lab Results  Component Value Date   CHOL 171 09/10/2017   HDL 57 09/10/2017   LDLCALC 93 09/10/2017   TRIG 105 09/10/2017   Lab Results  Component Value Date   WBC 8.6 11/18/2017   HGB 13.9 11/18/2017   HCT 43.8 11/18/2017   MCV 93.8 11/18/2017   PLT 361 11/18/2017   No results found for: IRON, TIBC, FERRITIN  Attestation Statements:   Reviewed by clinician on day of visit: allergies, medications, problem list, medical history, surgical history, family history, social history, and previous encounter notes.  Time spent on visit including pre-visit chart review and post-visit care was 20 minutes.   Migdalia Dk, am acting as Location manager for [employer], DO   I have reviewed the above documentation for accuracy and completeness, and I agree with the above. Jearld Lesch, DO

## 2019-02-02 MED FILL — LOSARTAN-HCTZ 100-25 MG TAB: 100-25 | 90 days supply | Qty: 90 | Fill #0

## 2019-03-11 ENCOUNTER — Ambulatory Visit (INDEPENDENT_AMBULATORY_CARE_PROVIDER_SITE_OTHER): Payer: 59 | Admitting: Family Medicine

## 2019-03-11 ENCOUNTER — Ambulatory Visit (INDEPENDENT_AMBULATORY_CARE_PROVIDER_SITE_OTHER): Payer: 59 | Admitting: Bariatrics

## 2019-03-15 DIAGNOSIS — Z1389 Encounter for screening for other disorder: Secondary | ICD-10-CM | POA: Diagnosis not present

## 2019-03-15 DIAGNOSIS — E039 Hypothyroidism, unspecified: Secondary | ICD-10-CM | POA: Diagnosis not present

## 2019-03-15 DIAGNOSIS — E119 Type 2 diabetes mellitus without complications: Secondary | ICD-10-CM | POA: Diagnosis not present

## 2019-03-15 DIAGNOSIS — E063 Autoimmune thyroiditis: Secondary | ICD-10-CM | POA: Diagnosis not present

## 2019-03-15 DIAGNOSIS — Z6837 Body mass index (BMI) 37.0-37.9, adult: Secondary | ICD-10-CM | POA: Diagnosis not present

## 2019-03-15 DIAGNOSIS — I10 Essential (primary) hypertension: Secondary | ICD-10-CM | POA: Diagnosis not present

## 2019-03-15 DIAGNOSIS — E7849 Other hyperlipidemia: Secondary | ICD-10-CM | POA: Diagnosis not present

## 2019-03-15 MED FILL — FUROSEMIDE 40 MG TAB: 40 | 90 days supply | Qty: 90 | Fill #0

## 2019-03-15 MED FILL — LEVOTHYROXINE SODIUM 75 MCG: 75 | 90 days supply | Qty: 90 | Fill #0

## 2019-03-15 MED FILL — metFORMIN HCL 500 MG TABS: 500 | 90 days supply | Qty: 90 | Fill #0

## 2019-03-15 MED FILL — SIMVASTATIN 40 MG TABLET: 40 | 90 days supply | Qty: 90 | Fill #0

## 2019-03-15 MED FILL — FLUoxetine HCL 20 MG CAPS: 20 | 90 days supply | Qty: 90 | Fill #0

## 2019-03-16 ENCOUNTER — Ambulatory Visit (INDEPENDENT_AMBULATORY_CARE_PROVIDER_SITE_OTHER): Payer: 59 | Admitting: Bariatrics

## 2019-03-21 DIAGNOSIS — E119 Type 2 diabetes mellitus without complications: Secondary | ICD-10-CM | POA: Diagnosis not present

## 2019-03-21 DIAGNOSIS — H524 Presbyopia: Secondary | ICD-10-CM | POA: Diagnosis not present

## 2019-04-29 MED FILL — MELOXICAM 15 MG TABLET: 15 | 90 days supply | Qty: 90 | Fill #0

## 2019-04-29 MED FILL — LOSARTAN-HCTZ 100-25 MG TAB: 100-25 | 90 days supply | Qty: 90 | Fill #0

## 2019-04-29 MED FILL — BUPROPION HCL ER (XL) 300 M: 300 | 90 days supply | Qty: 90 | Fill #0

## 2019-06-03 ENCOUNTER — Other Ambulatory Visit: Payer: Self-pay

## 2019-06-03 ENCOUNTER — Encounter (INDEPENDENT_AMBULATORY_CARE_PROVIDER_SITE_OTHER): Payer: Self-pay | Admitting: Bariatrics

## 2019-06-03 ENCOUNTER — Ambulatory Visit (INDEPENDENT_AMBULATORY_CARE_PROVIDER_SITE_OTHER): Payer: 59 | Admitting: Bariatrics

## 2019-06-03 VITALS — BP 185/71 | HR 61 | Temp 98.7°F | Ht 64.0 in | Wt 227.0 lb

## 2019-06-03 DIAGNOSIS — I10 Essential (primary) hypertension: Secondary | ICD-10-CM

## 2019-06-03 DIAGNOSIS — Z6839 Body mass index (BMI) 39.0-39.9, adult: Secondary | ICD-10-CM | POA: Diagnosis not present

## 2019-06-03 DIAGNOSIS — E119 Type 2 diabetes mellitus without complications: Secondary | ICD-10-CM

## 2019-06-07 ENCOUNTER — Encounter (INDEPENDENT_AMBULATORY_CARE_PROVIDER_SITE_OTHER): Payer: Self-pay | Admitting: Bariatrics

## 2019-06-07 NOTE — Progress Notes (Signed)
Chief Complaint:   OBESITY Crystal Roberts is here to discuss her progress with her obesity treatment plan along with follow-up of her obesity related diagnoses. Crystal Roberts is on the Category 2 Plan with the [place] and states she is following her eating plan approximately 25% of the time. Crystal Roberts states she is walking 3,000 steps a day 3 times per week.  Today's visit was #: 16 Starting weight: 241 lbs Starting date: 09/10/2017 Today's weight: 227 lbs Today's date: 06/03/2019 Total lbs lost to date: 14 Total lbs lost since last in-office visit: 0  Interim History: Crystal Roberts is up 14 lbs. Her last appointment was on 01/28/2019. She has been struggling since the death of her spouse and reports doing some emotional eating.  Subjective:   Essential hypertension. Crystal Roberts is taking Hyzaar. Blood pressure is elevated today.  BP Readings from Last 3 Encounters:  06/03/19 (!) 185/71  01/28/19 (!) 147/48  12/31/18 126/75   Lab Results  Component Value Date   CREATININE 0.87 11/18/2017   CREATININE 0.81 09/10/2017   CREATININE 0.69 08/13/2016   Type 2 diabetes mellitus without complication, without long-term current use of insulin (Crystal Roberts). Last A1c 5.5?  Lab Results  Component Value Date   HGBA1C 6.0 (H) 11/18/2017   HGBA1C 6.1 (H) 09/10/2017   HGBA1C 5.8 (H) 08/13/2016   Lab Results  Component Value Date   LDLCALC 93 09/10/2017   CREATININE 0.87 11/18/2017   Lab Results  Component Value Date   INSULIN 27.5 (H) 09/10/2017   Assessment/Plan:   Essential hypertension. Crystal Roberts is working on healthy weight loss and exercise to improve blood pressure control. We will watch for signs of hypotension as she continues her lifestyle modifications. She will continue her medication as directed.  Type 2 diabetes mellitus without complication, without long-term current use of insulin (Crystal Roberts). Good blood sugar control is important to decrease the likelihood of diabetic complications such as  nephropathy, neuropathy, limb loss, blindness, coronary artery disease, and death. Intensive lifestyle modification including diet, exercise and weight loss are the first line of treatment for diabetes. Crystal Roberts will continue her medication as directed.  Class 2 severe obesity with serious comorbidity and body mass index (BMI) of 39.0 to 39.9 in adult, unspecified obesity type (Crystal Roberts).  Crystal Roberts is currently in the action stage of change. As such, her goal is to continue with weight loss efforts. She has agreed to the Category 2 Plan with the [place].   She will work on meal planning, intentional eating, and increasing her water intake.  Exercise goals: Crystal Roberts will continue walking for exercise.  Behavioral modification strategies: increasing lean protein intake, decreasing simple carbohydrates, increasing vegetables, increasing water intake, decreasing eating out, no skipping meals, meal planning and cooking strategies and keeping healthy foods in the home.  Crystal Roberts has agreed to follow-up with our clinic in 4 weeks. She was informed of the importance of frequent follow-up visits to maximize her success with intensive lifestyle modifications for her multiple health conditions.   Blood pressure (!) 185/71, pulse 61, temperature 98.7 F (37.1 C), height 5\' 4"  (1.626 m), SpO2 97 %. Body mass index is 36.56 kg/m.  General: Cooperative, alert, well developed, in no acute distress. HEENT: Conjunctivae and lids unremarkable. Cardiovascular: Regular rhythm.  Lungs: Normal work of breathing. Neurologic: No focal deficits.   Lab Results  Component Value Date   CREATININE 0.87 11/18/2017   BUN 21 (H) 11/18/2017   NA 137 11/18/2017   K 4.1 11/18/2017  CL 104 11/18/2017   CO2 24 11/18/2017   Lab Results  Component Value Date   ALT 26 09/10/2017   AST 25 09/10/2017   ALKPHOS 82 09/10/2017   BILITOT 0.3 09/10/2017   Lab Results  Component Value Date   HGBA1C 6.0 (H) 11/18/2017   HGBA1C  6.1 (H) 09/10/2017   HGBA1C 5.8 (H) 08/13/2016   Lab Results  Component Value Date   INSULIN 27.5 (H) 09/10/2017   Lab Results  Component Value Date   TSH 3.390 09/10/2017   Lab Results  Component Value Date   CHOL 171 09/10/2017   HDL 57 09/10/2017   LDLCALC 93 09/10/2017   TRIG 105 09/10/2017   Lab Results  Component Value Date   WBC 8.6 11/18/2017   HGB 13.9 11/18/2017   HCT 43.8 11/18/2017   MCV 93.8 11/18/2017   PLT 361 11/18/2017   No results found for: IRON, TIBC, FERRITIN  Attestation Statements:   Reviewed by clinician on day of visit: allergies, medications, problem list, medical history, surgical history, family history, social history, and previous encounter notes.  Time spent on visit including pre-visit chart review and post-visit charting and care was 20 minutes.   Migdalia Dk, am acting as Location manager for [employer], DO   I have reviewed the above documentation for accuracy and completeness, and I agree with the above. Jearld Lesch, DO

## 2019-06-16 ENCOUNTER — Other Ambulatory Visit (HOSPITAL_COMMUNITY): Payer: Self-pay | Admitting: Family Medicine

## 2019-06-16 DIAGNOSIS — Z1231 Encounter for screening mammogram for malignant neoplasm of breast: Secondary | ICD-10-CM

## 2019-06-16 MED FILL — metFORMIN HCL 500 MG TABS: 500 | 90 days supply | Qty: 90 | Fill #1

## 2019-06-16 MED FILL — FLUoxetine HCL 20 MG CAPS: 20 | 90 days supply | Qty: 90 | Fill #1

## 2019-06-16 MED FILL — LEVOTHYROXINE SODIUM 75 MCG: 75 | 90 days supply | Qty: 90 | Fill #1

## 2019-07-01 ENCOUNTER — Ambulatory Visit (INDEPENDENT_AMBULATORY_CARE_PROVIDER_SITE_OTHER): Payer: 59 | Admitting: Bariatrics

## 2019-07-14 ENCOUNTER — Other Ambulatory Visit: Payer: 59 | Admitting: Obstetrics and Gynecology

## 2019-07-15 ENCOUNTER — Ambulatory Visit (HOSPITAL_COMMUNITY)
Admission: RE | Admit: 2019-07-15 | Discharge: 2019-07-15 | Disposition: A | Discharge status: Home / Self Care | Payer: 59 | Source: Ambulatory Visit | Attending: Family Medicine | Admitting: Family Medicine

## 2019-07-15 ENCOUNTER — Other Ambulatory Visit: Payer: Self-pay

## 2019-07-15 DIAGNOSIS — Z1231 Encounter for screening mammogram for malignant neoplasm of breast: Secondary | ICD-10-CM | POA: Diagnosis not present

## 2019-07-16 DIAGNOSIS — E119 Type 2 diabetes mellitus without complications: Secondary | ICD-10-CM | POA: Diagnosis not present

## 2019-07-16 DIAGNOSIS — Z6838 Body mass index (BMI) 38.0-38.9, adult: Secondary | ICD-10-CM | POA: Diagnosis not present

## 2019-07-16 DIAGNOSIS — Z23 Encounter for immunization: Secondary | ICD-10-CM | POA: Diagnosis not present

## 2019-07-16 DIAGNOSIS — E7849 Other hyperlipidemia: Secondary | ICD-10-CM | POA: Diagnosis not present

## 2019-07-16 DIAGNOSIS — E782 Mixed hyperlipidemia: Secondary | ICD-10-CM | POA: Diagnosis not present

## 2019-07-16 DIAGNOSIS — E039 Hypothyroidism, unspecified: Secondary | ICD-10-CM | POA: Diagnosis not present

## 2019-07-16 DIAGNOSIS — I10 Essential (primary) hypertension: Secondary | ICD-10-CM | POA: Diagnosis not present

## 2019-07-22 ENCOUNTER — Other Ambulatory Visit (HOSPITAL_COMMUNITY): Payer: Self-pay | Admitting: Family Medicine

## 2019-07-22 DIAGNOSIS — R921 Mammographic calcification found on diagnostic imaging of breast: Secondary | ICD-10-CM

## 2019-07-22 DIAGNOSIS — R928 Other abnormal and inconclusive findings on diagnostic imaging of breast: Secondary | ICD-10-CM

## 2019-08-03 DIAGNOSIS — M25511 Pain in right shoulder: Secondary | ICD-10-CM | POA: Diagnosis not present

## 2019-08-03 DIAGNOSIS — Z6839 Body mass index (BMI) 39.0-39.9, adult: Secondary | ICD-10-CM | POA: Diagnosis not present

## 2019-08-10 ENCOUNTER — Ambulatory Visit (HOSPITAL_COMMUNITY)
Admission: RE | Admit: 2019-08-10 | Discharge: 2019-08-10 | Disposition: A | Discharge status: Home / Self Care | Payer: 59 | Source: Ambulatory Visit | Attending: Family Medicine | Admitting: Family Medicine

## 2019-08-10 ENCOUNTER — Encounter (HOSPITAL_COMMUNITY): Payer: Self-pay

## 2019-08-10 ENCOUNTER — Other Ambulatory Visit: Payer: Self-pay

## 2019-08-10 DIAGNOSIS — R928 Other abnormal and inconclusive findings on diagnostic imaging of breast: Secondary | ICD-10-CM | POA: Insufficient documentation

## 2019-08-10 DIAGNOSIS — R921 Mammographic calcification found on diagnostic imaging of breast: Secondary | ICD-10-CM

## 2019-08-12 DIAGNOSIS — M25511 Pain in right shoulder: Secondary | ICD-10-CM | POA: Diagnosis not present

## 2019-08-12 DIAGNOSIS — M25512 Pain in left shoulder: Secondary | ICD-10-CM | POA: Diagnosis not present

## 2019-08-16 MED FILL — MELOXICAM 15 MG TABLET: 15 | 90 days supply | Qty: 90 | Fill #1

## 2019-08-16 MED FILL — SIMVASTATIN 40 MG TABLET: 40 | 90 days supply | Qty: 90 | Fill #1

## 2019-08-16 MED FILL — BUPROPION HCL ER (XL) 300 M: 300 | 90 days supply | Qty: 90 | Fill #1

## 2019-08-20 DIAGNOSIS — M25511 Pain in right shoulder: Secondary | ICD-10-CM | POA: Diagnosis not present

## 2019-09-01 ENCOUNTER — Other Ambulatory Visit: Payer: 59 | Admitting: Obstetrics and Gynecology

## 2019-09-02 NOTE — Progress Notes (Signed)
Patient ID: Crystal Roberts, female   DOB: [birth year], 64 y.o.   MRN: [id number]   Assessment:  Annual Gyn Exam  Conversations regarding life after her husband's death Plan:  1. Pap smear done, next pap due 5 years 2.  Shared tears with Inez Catalina, heard  how she is adjusting 3    Annual mammogram advised after age 31 Subjective:  Crystal Roberts is a 64 y.o. female G2P0020 who presents for annual exam. No LMP recorded. Patient is postmenopausal. The patient has complaints today of annual exam  The following portions of the patient's history were reviewed and updated as appropriate: allergies, current medications, past family history, past medical history, past social history, past surgical history and problem list. Past Medical History:  Diagnosis Date  . Anxiety   . Arthritis    neck- cervical disc herniation   . Asthma    as child  . Back pain   . Cancer ([place])    skin - low back , insitu  melanoma   . Constipation   . Depression   . Diabetes mellitus without complication ([place])   . Dyspnea   . Gallbladder problem   . GERD (gastroesophageal reflux disease)   . H/O cardiovascular stress test 1990   pt. reports that she was having a lot of stress & she was seen by cardiac- had stress test & told that it was negative   . Hyperlipidemia   . Hypertension   . Hypothyroidism   . Leg edema   . Pulmonary embolism ([place]) [place]. at [facility]- on blood thinning med., thought to be related to lack of activity, smoking & BCP    Past Surgical History:  Procedure Laterality Date  . ANTERIOR CERVICAL DECOMP/DISCECTOMY FUSION N/A 08/16/2016   Procedure: Cervical Four-Five Anterior cervical decompression/discectomy/fusion;  Surgeon: Erline Levine, MD;  Location: [place];  Service: Neurosurgery;  Laterality: N/A;  C4-5 Anterior cervical decompression/discectomy/fusion  . BACK SURGERY     sebaceous cyst removed  . CHOLECYSTECTOMY  4081   umbilical hernia ------ followed cholecystectomy  . CYSTECTOMY  Bilateral    cyst on ovaries  . HERNIA REPAIR     umbilical hernia repair   . MASS EXCISION N/A 11/21/2017   Procedure: EXCISION SEBACEOUS CYST ON BACK;  Surgeon: Aviva Signs, MD;  Location: [facility];  Service: General;  Laterality: N/A;  . TUBAL LIGATION       Current Outpatient Medications:  .  aspirin 81 MG tablet, Take 81 mg by mouth daily., Disp: , Rfl:  .  buPROPion (WELLBUTRIN XL) 300 MG 24 hr tablet, Take 300 mg by mouth daily., Disp: , Rfl:  .  CINNAMON PO, Take 500 mg by mouth at bedtime. , Disp: , Rfl:  .  FLUoxetine (PROZAC) 20 MG tablet, Take 20 mg by mouth daily., Disp: , Rfl:  .  levothyroxine (SYNTHROID, LEVOTHROID) 75 MCG tablet, Take 75 mcg by mouth daily before breakfast., Disp: , Rfl:  .  losartan-hydrochlorothiazide (HYZAAR) 100-25 MG per tablet, Take 1 tablet by mouth daily., Disp: , Rfl:  .  metFORMIN (GLUCOPHAGE) 500 MG tablet, Take 500 mg by mouth 2 (two) times daily with a meal. Take 1/2 tablet daily, Disp: , Rfl:  .  Multiple Vitamin (MULTIVITAMIN) tablet, Take 1 tablet by mouth daily., Disp: , Rfl:  .  Polyethyl Glycol-Propyl Glycol (LUBRICANT EYE DROPS) 0.4-0.3 % SOLN, Place 1 drop into both eyes 3 (three) times daily as needed (for itchy/dry eyes.)., Disp: , Rfl:  .  simvastatin (ZOCOR) 40 MG tablet, Take 40 mg by mouth at bedtime. , Disp: , Rfl:   Review of Systems Constitutional: negative Gastrointestinal: negative Genitourinary: not sexually active  occasional sui  Objective:  There were no vitals taken for this visit.   BMI: There is no height or weight on file to calculate BMI.BP (!) 162/79 (BP Location: Left Arm, Patient Position: Sitting, Cuff Size: Large)   Pulse 60   Ht 5' 4.25" (1.632 m)   Wt 231 lb (104.8 kg)   BMI 39.34 kg/m   General Appearance: Alert, appropriate appearance for age. No acute distress HEENT: Grossly normal Neck / Thyroid:  Cardiovascular: RRR; normal S1, S2, no murmur Lungs: CTA bilaterally Back: No CVAT Breast Exam:  No dimpling, nipple retraction or discharge. No masses or nodes., Normal to inspection, and No masses or nodes.No dimpling, nipple retraction or discharge. Gastrointestinal: Soft, non-tender, no masses or organomegaly Pelvic Exam: External genitalia: normal general appearance Vaginal: atrophic mucosa Cervix: normal appearance Rectal: good sphincter tone, no masses and guaiac negative Rectovaginal: guaiac negative stool obtained Lymphatic Exam: Non-palpable nodes in neck, clavicular, axillary, or inguinal regions  Skin: no rash or abnormalities Neurologic: Normal gait and speech, no tremor  Psychiatric: Alert and oriented, appropriate affect.  Urinalysis:Not done  Mallory Shirk. MD Pgr [phone] 10:14 AM  By signing my name below, I, De Burrs, attest that this documentation has been prepared under the direction and in the presence of Jonnie Kind, MD. Electronically Signed: De Burrs, Medical Scribe. 09/02/19. 10:14 AM.  I personally performed the services described in this documentation, which was SCRIBED in my presence. The recorded information has been reviewed and considered accurate. It has been edited as necessary during review. Jonnie Kind, MD

## 2019-09-03 ENCOUNTER — Other Ambulatory Visit (HOSPITAL_COMMUNITY)
Admission: RE | Admit: 2019-09-03 | Discharge: 2019-09-03 | Disposition: A | Discharge status: Home / Self Care | Payer: 59 | Source: Ambulatory Visit | Attending: Obstetrics and Gynecology | Admitting: Obstetrics and Gynecology

## 2019-09-03 ENCOUNTER — Encounter: Payer: Self-pay | Admitting: Obstetrics and Gynecology

## 2019-09-03 ENCOUNTER — Other Ambulatory Visit: Payer: Self-pay

## 2019-09-03 ENCOUNTER — Ambulatory Visit (INDEPENDENT_AMBULATORY_CARE_PROVIDER_SITE_OTHER): Payer: 59 | Admitting: Obstetrics and Gynecology

## 2019-09-03 VITALS — BP 162/79 | HR 60 | Ht 64.25 in | Wt 231.0 lb

## 2019-09-03 DIAGNOSIS — Z1212 Encounter for screening for malignant neoplasm of rectum: Secondary | ICD-10-CM

## 2019-09-03 DIAGNOSIS — Z01419 Encounter for gynecological examination (general) (routine) without abnormal findings: Secondary | ICD-10-CM

## 2019-09-03 DIAGNOSIS — Z1211 Encounter for screening for malignant neoplasm of colon: Secondary | ICD-10-CM

## 2019-09-03 LAB — HEMOCCULT GUIAC POC 1CARD (OFFICE): Fecal Occult Blood, POC: NEGATIVE

## 2019-09-06 LAB — CYTOLOGY - PAP
Comment: NEGATIVE
Diagnosis: NEGATIVE
High risk HPV: NEGATIVE

## 2019-09-07 DIAGNOSIS — U071 COVID-19: Secondary | ICD-10-CM

## 2019-09-07 HISTORY — DX: COVID-19: U07.1

## 2019-09-14 DIAGNOSIS — U071 COVID-19: Secondary | ICD-10-CM | POA: Diagnosis not present

## 2019-09-14 DIAGNOSIS — R21 Rash and other nonspecific skin eruption: Secondary | ICD-10-CM | POA: Diagnosis not present

## 2019-09-15 ENCOUNTER — Encounter (HOSPITAL_BASED_OUTPATIENT_CLINIC_OR_DEPARTMENT_OTHER): Payer: Self-pay | Admitting: Orthopaedic Surgery

## 2019-09-15 ENCOUNTER — Other Ambulatory Visit: Payer: Self-pay

## 2019-09-21 ENCOUNTER — Other Ambulatory Visit (HOSPITAL_COMMUNITY): Payer: 59

## 2019-09-21 ENCOUNTER — Encounter (HOSPITAL_BASED_OUTPATIENT_CLINIC_OR_DEPARTMENT_OTHER)
Admission: RE | Admit: 2019-09-21 | Discharge: 2019-09-21 | Disposition: A | Discharge status: Home / Self Care | Payer: 59 | Source: Ambulatory Visit | Attending: Orthopaedic Surgery | Admitting: Orthopaedic Surgery

## 2019-09-21 DIAGNOSIS — Z8249 Family history of ischemic heart disease and other diseases of the circulatory system: Secondary | ICD-10-CM | POA: Diagnosis not present

## 2019-09-21 DIAGNOSIS — M1990 Unspecified osteoarthritis, unspecified site: Secondary | ICD-10-CM | POA: Diagnosis not present

## 2019-09-21 DIAGNOSIS — Z87891 Personal history of nicotine dependence: Secondary | ICD-10-CM | POA: Diagnosis not present

## 2019-09-21 DIAGNOSIS — F419 Anxiety disorder, unspecified: Secondary | ICD-10-CM | POA: Diagnosis not present

## 2019-09-21 DIAGNOSIS — E785 Hyperlipidemia, unspecified: Secondary | ICD-10-CM | POA: Diagnosis not present

## 2019-09-21 DIAGNOSIS — Z7982 Long term (current) use of aspirin: Secondary | ICD-10-CM | POA: Diagnosis not present

## 2019-09-21 DIAGNOSIS — Z8349 Family history of other endocrine, nutritional and metabolic diseases: Secondary | ICD-10-CM | POA: Diagnosis not present

## 2019-09-21 DIAGNOSIS — Z85820 Personal history of malignant melanoma of skin: Secondary | ICD-10-CM | POA: Diagnosis not present

## 2019-09-21 DIAGNOSIS — S46011A Strain of muscle(s) and tendon(s) of the rotator cuff of right shoulder, initial encounter: Secondary | ICD-10-CM | POA: Diagnosis not present

## 2019-09-21 DIAGNOSIS — Z7984 Long term (current) use of oral hypoglycemic drugs: Secondary | ICD-10-CM | POA: Diagnosis not present

## 2019-09-21 DIAGNOSIS — Z882 Allergy status to sulfonamides status: Secondary | ICD-10-CM | POA: Diagnosis not present

## 2019-09-21 DIAGNOSIS — Z791 Long term (current) use of non-steroidal anti-inflammatories (NSAID): Secondary | ICD-10-CM | POA: Diagnosis not present

## 2019-09-21 DIAGNOSIS — I10 Essential (primary) hypertension: Secondary | ICD-10-CM | POA: Diagnosis not present

## 2019-09-21 DIAGNOSIS — J45909 Unspecified asthma, uncomplicated: Secondary | ICD-10-CM | POA: Diagnosis not present

## 2019-09-21 DIAGNOSIS — F329 Major depressive disorder, single episode, unspecified: Secondary | ICD-10-CM | POA: Diagnosis not present

## 2019-09-21 DIAGNOSIS — E119 Type 2 diabetes mellitus without complications: Secondary | ICD-10-CM | POA: Diagnosis not present

## 2019-09-21 DIAGNOSIS — E039 Hypothyroidism, unspecified: Secondary | ICD-10-CM | POA: Diagnosis not present

## 2019-09-21 DIAGNOSIS — S46211A Strain of muscle, fascia and tendon of other parts of biceps, right arm, initial encounter: Secondary | ICD-10-CM | POA: Diagnosis not present

## 2019-09-21 DIAGNOSIS — K219 Gastro-esophageal reflux disease without esophagitis: Secondary | ICD-10-CM | POA: Diagnosis not present

## 2019-09-21 DIAGNOSIS — Z7952 Long term (current) use of systemic steroids: Secondary | ICD-10-CM | POA: Diagnosis not present

## 2019-09-21 DIAGNOSIS — X58XXXA Exposure to other specified factors, initial encounter: Secondary | ICD-10-CM | POA: Diagnosis not present

## 2019-09-21 DIAGNOSIS — Z86711 Personal history of pulmonary embolism: Secondary | ICD-10-CM | POA: Diagnosis not present

## 2019-09-21 DIAGNOSIS — Z79899 Other long term (current) drug therapy: Secondary | ICD-10-CM | POA: Diagnosis not present

## 2019-09-21 LAB — BASIC METABOLIC PANEL
Anion gap: 9 (ref 5–15)
BUN: 23 mg/dL (ref 8–23)
CO2: 26 mmol/L (ref 22–32)
Calcium: 9.4 mg/dL (ref 8.9–10.3)
Chloride: 105 mmol/L (ref 98–111)
Creatinine, Ser: 0.95 mg/dL (ref 0.44–1.00)
GFR calc Af Amer: >60 mL/min (ref >60)
GFR calc non Af Amer: >60 mL/min (ref >60)
Glucose, Bld: 110 mg/dL — ABNORMAL HIGH (ref 70–99)
Potassium: 3.7 mmol/L (ref 3.5–5.1)
Sodium: 140 mmol/L (ref 135–145)

## 2019-09-21 NOTE — Progress Notes (Signed)
      Enhanced Recovery after Surgery for Orthopedics Enhanced Recovery after Surgery is a protocol used to improve the stress on your body and your recovery after surgery.  Patient Instructions  . The night before surgery:  o No food after midnight. ONLY clear liquids after midnight  . The day of surgery (if you do NOT have diabetes):  o Drink ONE (1) Pre-Surgery Clear Ensure as directed.   o This drink was given to you during your hospital  pre-op appointment visit. o The pre-op nurse will instruct you on the time to drink the  Pre-Surgery Ensure depending on your surgery time. o Finish the drink at the designated time by the pre-op nurse.  o Nothing else to drink after completing the  Pre-Surgery Clear Ensure.  . The day of surgery (if you have diabetes): o Drink ONE (1) Gatorade 2 (G2) as directed. o This drink was given to you during your hospital  pre-op appointment visit.  o The pre-op nurse will instruct you on the time to drink the   Gatorade 2 (G2) depending on your surgery time. o Color of the Gatorade may vary. Red is not allowed. o Nothing else to drink after completing the  Gatorade 2 (G2).         If you have questions, please contact your surgeon's office. Surgical soap given with instructions, pt verbalized understanding. Benzoyl peroxide gel given with instructions, pt verbalized understanding. 

## 2019-09-22 NOTE — Anesthesia Preprocedure Evaluation (Addendum)
Anesthesia Evaluation  Patient identified by MRN, date of birth, ID band Patient awake    Reviewed: Allergy & Precautions, NPO status , Patient's Chart, lab work & pertinent test results  History of Anesthesia Complications Negative for: history of anesthetic complications  Airway Mallampati: I  TM Distance: >3 FB Neck ROM: Full    Dental  (+) Edentulous Upper, Edentulous Lower, Dental Advisory Given   Pulmonary asthma , former smoker,    Pulmonary exam normal        Cardiovascular hypertension, Normal cardiovascular exam     Neuro/Psych PSYCHIATRIC DISORDERS Anxiety Depression negative neurological ROS     GI/Hepatic Neg liver ROS, GERD  Medicated,  Endo/Other  diabetesHypothyroidism   Renal/GU negative Renal ROS     Musculoskeletal   Abdominal   Peds  Hematology   Anesthesia Other Findings   Reproductive/Obstetrics                            Anesthesia Physical Anesthesia Plan  ASA: III  Anesthesia Plan: General   Post-op Pain Management:  Regional for Post-op pain   Induction: Intravenous  PONV Risk Score and Plan: 3 and Ondansetron, Dexamethasone and Midazolam  Airway Management Planned: Oral ETT  Additional Equipment:   Intra-op Plan:   Post-operative Plan: Extubation in OR  Informed Consent: I have reviewed the patients History and Physical, chart, labs and discussed the procedure including the risks, benefits and alternatives for the proposed anesthesia with the patient or authorized representative who has indicated his/her understanding and acceptance.     Dental advisory given  Plan Discussed with: CRNA and Anesthesiologist  Anesthesia Plan Comments:        Anesthesia Quick Evaluation

## 2019-09-22 NOTE — Progress Notes (Signed)
Reminded pt to bring confirmation of previous positive covid test (June 2021)  Pt verbalized understanding.

## 2019-09-23 ENCOUNTER — Encounter (HOSPITAL_BASED_OUTPATIENT_CLINIC_OR_DEPARTMENT_OTHER)
Admission: RE | Disposition: A | Discharge status: Home / Self Care | Payer: Self-pay | Source: Home / Self Care | Attending: Orthopaedic Surgery

## 2019-09-23 ENCOUNTER — Ambulatory Visit (HOSPITAL_BASED_OUTPATIENT_CLINIC_OR_DEPARTMENT_OTHER): Payer: 59 | Admitting: Anesthesiology

## 2019-09-23 ENCOUNTER — Other Ambulatory Visit: Payer: Self-pay

## 2019-09-23 ENCOUNTER — Encounter (HOSPITAL_BASED_OUTPATIENT_CLINIC_OR_DEPARTMENT_OTHER): Payer: Self-pay | Admitting: Orthopaedic Surgery

## 2019-09-23 ENCOUNTER — Ambulatory Visit (HOSPITAL_BASED_OUTPATIENT_CLINIC_OR_DEPARTMENT_OTHER)
Admission: RE | Admit: 2019-09-23 | Discharge: 2019-09-23 | Disposition: A | Discharge status: Home / Self Care | Payer: 59 | Attending: Orthopaedic Surgery | Admitting: Orthopaedic Surgery

## 2019-09-23 ENCOUNTER — Other Ambulatory Visit (HOSPITAL_BASED_OUTPATIENT_CLINIC_OR_DEPARTMENT_OTHER): Payer: Self-pay | Admitting: Physician Assistant

## 2019-09-23 DIAGNOSIS — E039 Hypothyroidism, unspecified: Secondary | ICD-10-CM | POA: Insufficient documentation

## 2019-09-23 DIAGNOSIS — Z79899 Other long term (current) drug therapy: Secondary | ICD-10-CM | POA: Insufficient documentation

## 2019-09-23 DIAGNOSIS — M19011 Primary osteoarthritis, right shoulder: Secondary | ICD-10-CM | POA: Diagnosis not present

## 2019-09-23 DIAGNOSIS — Z882 Allergy status to sulfonamides status: Secondary | ICD-10-CM | POA: Insufficient documentation

## 2019-09-23 DIAGNOSIS — Z7952 Long term (current) use of systemic steroids: Secondary | ICD-10-CM | POA: Insufficient documentation

## 2019-09-23 DIAGNOSIS — Z8249 Family history of ischemic heart disease and other diseases of the circulatory system: Secondary | ICD-10-CM | POA: Insufficient documentation

## 2019-09-23 DIAGNOSIS — Z87891 Personal history of nicotine dependence: Secondary | ICD-10-CM | POA: Insufficient documentation

## 2019-09-23 DIAGNOSIS — I10 Essential (primary) hypertension: Secondary | ICD-10-CM | POA: Insufficient documentation

## 2019-09-23 DIAGNOSIS — E119 Type 2 diabetes mellitus without complications: Secondary | ICD-10-CM | POA: Insufficient documentation

## 2019-09-23 DIAGNOSIS — F419 Anxiety disorder, unspecified: Secondary | ICD-10-CM | POA: Diagnosis not present

## 2019-09-23 DIAGNOSIS — M7541 Impingement syndrome of right shoulder: Secondary | ICD-10-CM | POA: Diagnosis not present

## 2019-09-23 DIAGNOSIS — X58XXXA Exposure to other specified factors, initial encounter: Secondary | ICD-10-CM | POA: Insufficient documentation

## 2019-09-23 DIAGNOSIS — Z7984 Long term (current) use of oral hypoglycemic drugs: Secondary | ICD-10-CM | POA: Insufficient documentation

## 2019-09-23 DIAGNOSIS — S46011A Strain of muscle(s) and tendon(s) of the rotator cuff of right shoulder, initial encounter: Secondary | ICD-10-CM | POA: Diagnosis not present

## 2019-09-23 DIAGNOSIS — M1990 Unspecified osteoarthritis, unspecified site: Secondary | ICD-10-CM | POA: Insufficient documentation

## 2019-09-23 DIAGNOSIS — Z85820 Personal history of malignant melanoma of skin: Secondary | ICD-10-CM | POA: Insufficient documentation

## 2019-09-23 DIAGNOSIS — E785 Hyperlipidemia, unspecified: Secondary | ICD-10-CM | POA: Diagnosis not present

## 2019-09-23 DIAGNOSIS — M24111 Other articular cartilage disorders, right shoulder: Secondary | ICD-10-CM | POA: Diagnosis not present

## 2019-09-23 DIAGNOSIS — F329 Major depressive disorder, single episode, unspecified: Secondary | ICD-10-CM | POA: Insufficient documentation

## 2019-09-23 DIAGNOSIS — K219 Gastro-esophageal reflux disease without esophagitis: Secondary | ICD-10-CM | POA: Insufficient documentation

## 2019-09-23 DIAGNOSIS — S46211A Strain of muscle, fascia and tendon of other parts of biceps, right arm, initial encounter: Secondary | ICD-10-CM | POA: Diagnosis not present

## 2019-09-23 DIAGNOSIS — Z791 Long term (current) use of non-steroidal anti-inflammatories (NSAID): Secondary | ICD-10-CM | POA: Insufficient documentation

## 2019-09-23 DIAGNOSIS — G8918 Other acute postprocedural pain: Secondary | ICD-10-CM | POA: Diagnosis not present

## 2019-09-23 DIAGNOSIS — J45909 Unspecified asthma, uncomplicated: Secondary | ICD-10-CM | POA: Insufficient documentation

## 2019-09-23 DIAGNOSIS — Z8349 Family history of other endocrine, nutritional and metabolic diseases: Secondary | ICD-10-CM | POA: Insufficient documentation

## 2019-09-23 DIAGNOSIS — Z7982 Long term (current) use of aspirin: Secondary | ICD-10-CM | POA: Insufficient documentation

## 2019-09-23 DIAGNOSIS — Z86711 Personal history of pulmonary embolism: Secondary | ICD-10-CM | POA: Insufficient documentation

## 2019-09-23 HISTORY — PX: SHOULDER ARTHROSCOPY WITH ROTATOR CUFF REPAIR AND SUBACROMIAL DECOMPRESSION: SHX5686

## 2019-09-23 LAB — GLUCOSE, CAPILLARY: Glucose-Capillary: 117 mg/dL — ABNORMAL HIGH (ref 70–99)

## 2019-09-23 SURGERY — SHOULDER ARTHROSCOPY WITH ROTATOR CUFF REPAIR AND SUBACROMIAL DECOMPRESSION
Anesthesia: General | Site: Shoulder | Laterality: Right

## 2019-09-23 MED ORDER — LACTATED RINGERS IV SOLN: INTRAVENOUS | Status: DC

## 2019-09-23 MED ORDER — FENTANYL CITRATE (PF) 100 MCG/2ML IJ SOLN
100.0000 ug | Freq: Once | INTRAMUSCULAR | Status: AC
  Administered 2019-09-23: 100 ug via INTRAVENOUS

## 2019-09-23 MED ORDER — EPHEDRINE SULFATE 50 MG/ML IJ SOLN
INTRAMUSCULAR | Status: DC | PRN
  Administered 2019-09-23 (×2): 5 mg via INTRAVENOUS

## 2019-09-23 MED ORDER — FENTANYL CITRATE (PF) 100 MCG/2ML IJ SOLN
INTRAMUSCULAR | Status: AC
  Filled 2019-09-23: qty 2

## 2019-09-23 MED ORDER — LIDOCAINE HCL (CARDIAC) PF 100 MG/5ML IV SOSY
PREFILLED_SYRINGE | INTRAVENOUS | Status: DC | PRN
  Administered 2019-09-23: 40 mg via INTRAVENOUS

## 2019-09-23 MED ORDER — FENTANYL CITRATE (PF) 100 MCG/2ML IJ SOLN: 25.0000 ug | INTRAMUSCULAR | Status: DC | PRN

## 2019-09-23 MED ORDER — EPHEDRINE 5 MG/ML INJ
INTRAVENOUS | Status: AC
  Filled 2019-09-23: qty 10

## 2019-09-23 MED ORDER — OXYCODONE HCL 5 MG PO TABS: ORAL_TABLET | ORAL | 0 refills | Status: AC

## 2019-09-23 MED ORDER — CEFAZOLIN SODIUM-DEXTROSE 2-4 GM/100ML-% IV SOLN
INTRAVENOUS | Status: AC
  Filled 2019-09-23: qty 100

## 2019-09-23 MED ORDER — PROPOFOL 10 MG/ML IV BOLUS
INTRAVENOUS | Status: DC | PRN
  Administered 2019-09-23: 150 mg via INTRAVENOUS

## 2019-09-23 MED ORDER — BUPIVACAINE LIPOSOME 1.3 % IJ SUSP
INTRAMUSCULAR | Status: DC | PRN
  Administered 2019-09-23: 10 mL

## 2019-09-23 MED ORDER — MIDAZOLAM HCL 2 MG/2ML IJ SOLN
2.0000 mg | Freq: Once | INTRAMUSCULAR | Status: AC
  Administered 2019-09-23: 2 mg via INTRAVENOUS

## 2019-09-23 MED ORDER — ACETAMINOPHEN 500 MG PO TABS
1000.0000 mg | ORAL_TABLET | Freq: Once | ORAL | Status: AC
  Administered 2019-09-23: 1000 mg via ORAL

## 2019-09-23 MED ORDER — PROMETHAZINE HCL 25 MG/ML IJ SOLN: 6.2500 mg | INTRAMUSCULAR | Status: DC | PRN

## 2019-09-23 MED ORDER — ACETAMINOPHEN 500 MG PO TABS
ORAL_TABLET | ORAL | Status: AC
  Filled 2019-09-23: qty 2

## 2019-09-23 MED ORDER — CELECOXIB 200 MG PO CAPS: 200.0000 mg | ORAL_CAPSULE | Freq: Once | ORAL | Status: DC

## 2019-09-23 MED ORDER — MIDAZOLAM HCL 2 MG/2ML IJ SOLN
INTRAMUSCULAR | Status: AC
  Filled 2019-09-23: qty 2

## 2019-09-23 MED ORDER — CEFAZOLIN SODIUM-DEXTROSE 2-4 GM/100ML-% IV SOLN
2.0000 g | INTRAVENOUS | Status: AC
  Administered 2019-09-23: 2 g via INTRAVENOUS

## 2019-09-23 MED ORDER — DEXAMETHASONE SODIUM PHOSPHATE 4 MG/ML IJ SOLN
INTRAMUSCULAR | Status: DC | PRN
  Administered 2019-09-23: 10 mg via INTRAVENOUS

## 2019-09-23 MED ORDER — ROCURONIUM BROMIDE 100 MG/10ML IV SOLN
INTRAVENOUS | Status: DC | PRN
  Administered 2019-09-23: 80 mg via INTRAVENOUS

## 2019-09-23 MED ORDER — ONDANSETRON HCL 4 MG/2ML IJ SOLN
INTRAMUSCULAR | Status: DC | PRN
  Administered 2019-09-23: 4 mg via INTRAVENOUS

## 2019-09-23 MED ORDER — LIDOCAINE 2% (20 MG/ML) 5 ML SYRINGE
INTRAMUSCULAR | Status: AC
  Filled 2019-09-23: qty 5

## 2019-09-23 MED ORDER — SUGAMMADEX SODIUM 200 MG/2ML IV SOLN
INTRAVENOUS | Status: DC | PRN
  Administered 2019-09-23: 200 mg via INTRAVENOUS

## 2019-09-23 MED ORDER — BUPIVACAINE HCL (PF) 0.5 % IJ SOLN
INTRAMUSCULAR | Status: DC | PRN
  Administered 2019-09-23: 15 mL via PERINEURAL

## 2019-09-23 MED ORDER — PHENYLEPHRINE HCL (PRESSORS) 10 MG/ML IV SOLN
INTRAVENOUS | Status: AC
  Filled 2019-09-23: qty 1

## 2019-09-23 MED ORDER — MELOXICAM 15 MG PO TABS: 15.0000 mg | ORAL_TABLET | Freq: Every day | ORAL | 0 refills | Status: DC

## 2019-09-23 MED ORDER — ONDANSETRON HCL 4 MG/2ML IJ SOLN
INTRAMUSCULAR | Status: AC
  Filled 2019-09-23: qty 2

## 2019-09-23 MED ORDER — ACETAMINOPHEN 500 MG PO TABS: 1000.0000 mg | ORAL_TABLET | Freq: Three times a day (TID) | ORAL | 0 refills | Status: AC

## 2019-09-23 MED ORDER — DEXAMETHASONE SODIUM PHOSPHATE 10 MG/ML IJ SOLN
INTRAMUSCULAR | Status: AC
  Filled 2019-09-23: qty 1

## 2019-09-23 MED ORDER — ROCURONIUM BROMIDE 10 MG/ML (PF) SYRINGE
PREFILLED_SYRINGE | INTRAVENOUS | Status: AC
  Filled 2019-09-23: qty 10

## 2019-09-23 MED ORDER — PROPOFOL 10 MG/ML IV BOLUS
INTRAVENOUS | Status: AC
  Filled 2019-09-23: qty 20

## 2019-09-23 MED ORDER — ONDANSETRON HCL 4 MG PO TABS: 4.0000 mg | ORAL_TABLET | Freq: Three times a day (TID) | ORAL | 1 refills | Status: AC | PRN

## 2019-09-23 MED FILL — oxyCODONE HCL 5 MG TABS: 5 | 5 days supply | Qty: 30 | Fill #0

## 2019-09-23 MED FILL — ONDANSETRON HCL 4 MG TABLET: 4 | 7 days supply | Qty: 10 | Fill #0

## 2019-09-23 SURGICAL SUPPLY — 66 items
AID PSTN UNV HD RSTRNT DISP (MISCELLANEOUS) ×1
ANCH SUT SWLK 19.1X4.75 (Anchor) ×3 IMPLANT
ANCHOR SUT BIO SW 4.75X19.1 (Anchor) ×3 IMPLANT
APL PRP STRL LF DISP 70% ISPRP (MISCELLANEOUS) ×1
BLADE EXCALIBUR 4.0X13 (MISCELLANEOUS) ×2 IMPLANT
BURR OVAL 8 FLU 4.0X13 (MISCELLANEOUS) ×1 IMPLANT
CANNULA 5.75X71 LONG (CANNULA) IMPLANT
CANNULA PASSPORT 5 (CANNULA) IMPLANT
CANNULA PASSPORT BUTTON 10-40 (CANNULA) ×2 IMPLANT
CANNULA TWIST IN 8.25X7CM (CANNULA) ×2 IMPLANT
CHLORAPREP W/TINT 26 (MISCELLANEOUS) ×2 IMPLANT
CLSR STERI-STRIP ANTIMIC 1/2X4 (GAUZE/BANDAGES/DRESSINGS) ×2 IMPLANT
COOLER ICEMAN CLASSIC (MISCELLANEOUS) ×2 IMPLANT
COVER WAND RF STERILE (DRAPES) IMPLANT
DECANTER SPIKE VIAL GLASS SM (MISCELLANEOUS) IMPLANT
DISSECTOR 3.5MM X 13CM CVD (MISCELLANEOUS) IMPLANT
DISSECTOR 4.0MMX13CM CVD (MISCELLANEOUS) IMPLANT
DRAPE IMP U-DRAPE 54X76 (DRAPES) ×2 IMPLANT
DRAPE INCISE IOBAN 66X45 STRL (DRAPES) ×2 IMPLANT
DRAPE SHOULDER BEACH CHAIR (DRAPES) ×2 IMPLANT
DRSG PAD ABDOMINAL 8X10 ST (GAUZE/BANDAGES/DRESSINGS) ×2 IMPLANT
DW OUTFLOW CASSETTE/TUBE SET (MISCELLANEOUS) ×2 IMPLANT
GAUZE SPONGE 4X4 12PLY STRL (GAUZE/BANDAGES/DRESSINGS) ×2 IMPLANT
GAUZE XEROFORM 1X8 LF (GAUZE/BANDAGES/DRESSINGS) IMPLANT
GLOVE BIO SURGEON STRL SZ 6.5 (GLOVE) ×2 IMPLANT
GLOVE BIOGEL PI IND STRL 6.5 (GLOVE) ×1 IMPLANT
GLOVE BIOGEL PI IND STRL 8 (GLOVE) ×1 IMPLANT
GLOVE BIOGEL PI INDICATOR 6.5 (GLOVE) ×1
GLOVE BIOGEL PI INDICATOR 8 (GLOVE) ×1
GLOVE ECLIPSE 8.0 STRL XLNG CF (GLOVE) ×2 IMPLANT
GOWN STRL REUS W/ TWL LRG LVL3 (GOWN DISPOSABLE) ×1 IMPLANT
GOWN STRL REUS W/TWL LRG LVL3 (GOWN DISPOSABLE) ×2
GOWN STRL REUS W/TWL XL LVL3 (GOWN DISPOSABLE) ×2 IMPLANT
IMPL SPEEDBRIDGE KIT (Orthopedic Implant) IMPLANT
IMPLANT SPEEDBRIDGE KIT (Orthopedic Implant) ×2 IMPLANT
KIT STABILIZATION SHOULDER (MISCELLANEOUS) ×2 IMPLANT
KIT STR SPEAR 1.8 FBRTK DISP (KITS) IMPLANT
LASSO 90 CVE QUICKPAS (DISPOSABLE) IMPLANT
LASSO CRESCENT QUICKPASS (SUTURE) ×2 IMPLANT
MANIFOLD NEPTUNE II (INSTRUMENTS) ×2 IMPLANT
NDL SAFETY ECLIPSE 18X1.5 (NEEDLE) ×1 IMPLANT
NDL SCORPION MULTI FIRE (NEEDLE) IMPLANT
NEEDLE HYPO 18GX1.5 SHARP (NEEDLE) ×2
NEEDLE SCORPION MULTI FIRE (NEEDLE) IMPLANT
PACK ARTHROSCOPY DSU (CUSTOM PROCEDURE TRAY) ×2 IMPLANT
PACK BASIN DAY SURGERY FS (CUSTOM PROCEDURE TRAY) ×2 IMPLANT
PAD COLD SHLDR WRAP-ON (PAD) ×2 IMPLANT
PAD ORTHO SHOULDER 7X19 LRG (SOFTGOODS) ×3 IMPLANT
PORT APPOLLO RF 90DEGREE MULTI (SURGICAL WAND) ×2 IMPLANT
RESTRAINT HEAD UNIVERSAL NS (MISCELLANEOUS) ×2 IMPLANT
SHEET MEDIUM DRAPE 40X70 STRL (DRAPES) ×2 IMPLANT
SLEEVE SCD COMPRESS KNEE MED (MISCELLANEOUS) ×2 IMPLANT
SLING ARM FOAM STRAP LRG (SOFTGOODS) IMPLANT
SUT FIBERWIRE #2 38 T-5 BLUE (SUTURE)
SUT MNCRL AB 4-0 PS2 18 (SUTURE) ×3 IMPLANT
SUT PDS AB 1 CT  36 (SUTURE)
SUT PDS AB 1 CT 36 (SUTURE) IMPLANT
SUT TIGER TAPE 7 IN WHITE (SUTURE) ×2 IMPLANT
SUTURE FIBERWR #2 38 T-5 BLUE (SUTURE) IMPLANT
SUTURE TAPE TIGERLINK 1.3MM BL (SUTURE) IMPLANT
SUTURETAPE TIGERLINK 1.3MM BL (SUTURE) ×2
SYR 5ML LL (SYRINGE) ×2 IMPLANT
TAPE FIBER 2MM 7IN #2 BLUE (SUTURE) ×2 IMPLANT
TOWEL GREEN STERILE FF (TOWEL DISPOSABLE) ×2 IMPLANT
TUBE CONNECTING 20X1/4 (TUBING) ×2 IMPLANT
TUBING ARTHROSCOPY IRRIG 16FT (MISCELLANEOUS) ×2 IMPLANT

## 2019-09-23 NOTE — H&P (Signed)
PREOPERATIVE H&P  Chief Complaint: RIGHT SHOULDER OSTEOARTHRIS  HPI: Crystal Roberts is a 64 y.o. female who is scheduled for RIGHT SHOULDER ARTHROSCOPY DEBRIDEMENT, ACROMIOPLASTY, DISTAL CLAVICAL EXCISION ROATOR CUFF REPAIR,BICEP TENODESIS.   Patient has a past medical history significant for diabetes, asthma, hypothyroidism, GERD, anxiety, depression.   Patientis a 64 year old who has had a traumatic injury to her right shoulder. She had an injury in early July and she is now having trouble raising her arm over her head.  Her symptoms are rated as moderate to severe, and have been worsening.  This is significantly impairing activities of daily living.    Please see clinic note for further details on this patient's care.    She has elected for surgical management.   Past Medical History:  Diagnosis Date  . Anxiety   . Arthritis    neck- cervical disc herniation   . Asthma    as child  . Back pain   . Cancer ([place])    skin - low back , insitu  melanoma   . Constipation   . COVID-19 09/07/2019   sx fever/cough/chills/lost taste and smell  . Depression   . Diabetes mellitus without complication ([place])   . Dyspnea   . Gallbladder problem   . GERD (gastroesophageal reflux disease)   . H/O cardiovascular stress test 1990   pt. reports that she was having a lot of stress & she was seen by cardiac- had stress test & told that it was negative   . Hyperlipidemia   . Hypertension   . Hypothyroidism   . Leg edema   . Pulmonary embolism ([place]) [place]. at [facility]- on blood thinning med., thought to be related to lack of activity, smoking & BCP   Past Surgical History:  Procedure Laterality Date  . ANTERIOR CERVICAL DECOMP/DISCECTOMY FUSION N/A 08/16/2016   Procedure: Cervical Four-Five Anterior cervical decompression/discectomy/fusion;  Surgeon: Erline Levine, MD;  Location: [place];  Service: Neurosurgery;  Laterality: N/A;  C4-5 Anterior cervical decompression/discectomy/fusion    . BACK SURGERY     sebaceous cyst removed  . CHOLECYSTECTOMY  8413   umbilical hernia ------ followed cholecystectomy  . Cyst removed from back    . CYSTECTOMY Bilateral    cyst on ovaries  . HERNIA REPAIR     umbilical hernia repair   . MASS EXCISION N/A 11/21/2017   Procedure: EXCISION SEBACEOUS CYST ON BACK;  Surgeon: Aviva Signs, MD;  Location: [facility];  Service: General;  Laterality: N/A;  . TUBAL LIGATION     Social History   Socioeconomic History  . Marital status: Widowed    Spouse name: Gwyndolyn Saxon "Mychaela Lennartz  . Number of children: Not on file  . Years of education: Not on file  . Highest education level: Not on file  Occupational History  . Occupation: [employer]  Tobacco Use  . Smoking status: Former Smoker    Packs/day: 1.00    Years: 10.00    Pack years: 10.00    Types: Cigarettes    Quit date: 08/14/1986    Years since quitting: 33.1  . Smokeless tobacco: Never Used  Vaping Use  . Vaping Use: Never used  Substance and Sexual Activity  . Alcohol use: Yes    Comment: very rare   . Drug use: No  . Sexual activity: Not Currently    Birth control/protection: Post-menopausal, Surgical    Comment: tubal  Other Topics Concern  . Not on file  Social History Narrative  . Not on file   Social Determinants of Health   Financial Resource Strain: Low Risk   . Difficulty of Paying Living Expenses: Not very hard  Food Insecurity: No Food Insecurity  . Worried About Charity fundraiser in the Last Year: Never true  . Ran Out of Food in the Last Year: Never true  Transportation Needs: No Transportation Needs  . Lack of Transportation (Medical): No  . Lack of Transportation (Non-Medical): No  Physical Activity: Insufficiently Active  . Days of Exercise per Week: 2 days  . Minutes of Exercise per Session: 20 min  Stress: Stress Concern Present  . Feeling of Stress : Rather much  Social Connections: Moderately Isolated  . Frequency of Communication with Friends and  Family: More than three times a week  . Frequency of Social Gatherings with Friends and Family: More than three times a week  . Attends Religious Services: 1 to 4 times per year  . Active Member of Clubs or Organizations: No  . Attends Archivist Meetings: Never  . Marital Status: Widowed   Family History  Problem Relation Age of Onset  . Heart disease Mother   . Hypertension Mother   . Hyperlipidemia Mother   . Cancer Father        melenoma  . Diabetes Sister   . Stroke Brother   . Heart disease Brother   . Diabetes Paternal Grandmother   . Diabetes Sister   . Cancer Sister        kidney; had kidney removed   Allergies  Allergen Reactions  . Sulfa Antibiotics Itching and Rash   Prior to Admission medications   Medication Sig Start Date End Date Taking? Authorizing Provider  aspirin 81 MG tablet Take 81 mg by mouth daily.   Yes [provider]  buPROPion (WELLBUTRIN XL) 300 MG 24 hr tablet Take 300 mg by mouth daily.   Yes [provider]  CINNAMON PO Take 500 mg by mouth at bedtime.    Yes [provider]  diazepam (VALIUM) 2 MG tablet Take 2 mg by mouth 2 (two) times daily. 06/25/19  Yes [provider]  FLUoxetine (PROZAC) 20 MG tablet Take 20 mg by mouth daily.   Yes [provider]  furosemide (LASIX) 40 MG tablet Take 40 mg by mouth daily. 03/15/19  Yes [provider]  levothyroxine (SYNTHROID, LEVOTHROID) 75 MCG tablet Take 75 mcg by mouth daily before breakfast.   Yes [provider]  losartan-hydrochlorothiazide (HYZAAR) 100-25 MG per tablet Take 1 tablet by mouth daily.   Yes [provider]  meloxicam (MOBIC) 15 MG tablet Take 15 mg by mouth daily. 08/16/19  Yes [provider]  metFORMIN (GLUCOPHAGE) 500 MG tablet Take 500 mg by mouth. Take 1/2 tablet BID   Yes [provider]  Multiple Vitamin (MULTIVITAMIN) tablet Take 1 tablet by mouth daily.   Yes [provider]  omeprazole (PRILOSEC) 20 MG capsule Take 20 mg by mouth daily.   Yes [provider]  Polyethyl Glycol-Propyl Glycol (LUBRICANT EYE DROPS) 0.4-0.3 % SOLN Place 1 drop into both eyes 3 (three) times daily as needed (for itchy/dry eyes.).   Yes [provider]  predniSONE (DELTASONE) 5 MG tablet Take 5 mg by mouth daily with breakfast.   Yes [provider]  simvastatin (ZOCOR) 40 MG tablet Take 40 mg by mouth at bedtime.    Yes [provider]  triamcinolone (  KENALOG) 0.025 % cream Apply 1 application topically 2 (two) times daily. Rash on leg   Yes [provider]    ROS: All other systems have been reviewed and were otherwise negative with the exception of those mentioned in the HPI and as above.  Physical Exam: General: Alert, no acute distress Cardiovascular: No pedal edema Respiratory: No cyanosis, no use of accessory musculature GI: No organomegaly, abdomen is soft and non-tender Skin: No lesions in the area of chief complaint Neurologic: Sensation intact distally Psychiatric: Patient is competent for consent with normal mood and affect Lymphatic: No axillary or cervical lymphadenopathy  MUSCULOSKELETAL:  Right upper extremity: right shoulder weakness with supraspinatus testing. Negative impingement, postive o'briens  Imaging: MRI right shoulder shows complete full thickness tear of supraspinatus tendon and infraspinatus, near complete tear of subscapularis  Assessment: RIGHT SHOULDER Massive cuff tear  Plan: Plan for Procedure(s): RIGHT SHOULDER ARTHROSCOPY DEBRIDEMENT, ACROMIOPLASTY, DISTAL CLAVICAL EXCISION ROATOR CUFF REPAIR,BICEP TENODESIS  The risks benefits and alternatives were discussed with the patient including but not limited to the risks of nonoperative treatment, versus surgical intervention including infection, bleeding, nerve injury,  blood clots, cardiopulmonary complications, morbidity, mortality, among others,  and they were willing to proceed.   The patient acknowledged the explanation, agreed to proceed with the plan and consent was signed.   Operative Plan: arthroscopic RCR, BT, SAD, DCE.   - depending on the extent of the tear may need to abort and convert to RTSA at a later date (this was discussed with the patient) Discharge Medications: Tylenol, Celebrex, Oxycodone, Zofran DVT Prophylaxis: None Physical Therapy: outpatient PT Special Discharge needs: [place], PA-C  09/23/2019 6:06 AM

## 2019-09-23 NOTE — Anesthesia Postprocedure Evaluation (Signed)
Anesthesia Post Note  Patient: Crystal Roberts  Procedure(s) Performed: RIGHT SHOULDER ARTHROSCOPY DEBRIDEMENT, ACROMIOPLASTY, DISTAL CLAVICAL EXCISION ROTATOR CUFF REPAIR (Right Shoulder)     Patient location during evaluation: PACU Anesthesia Type: General Level of consciousness: sedated Pain management: pain level controlled Vital Signs Assessment: post-procedure vital signs reviewed and stable Respiratory status: spontaneous breathing and respiratory function stable Cardiovascular status: stable Postop Assessment: no apparent nausea or vomiting Anesthetic complications: no   No complications documented.  Last Vitals:  Vitals:   09/23/19 1000 09/23/19 1023  BP: (!) 167/65 (!) 146/67  Pulse: (!) 50 (!) 52  Resp: (!) 21   Temp:  36.5 C  SpO2: 93% 95%    Last Pain:  Vitals:   09/23/19 1023  TempSrc: Oral  PainSc:                  Marrie Chandra DANIEL

## 2019-09-23 NOTE — Transfer of Care (Signed)
Immediate Anesthesia Transfer of Care Note  Patient: Crystal Roberts  Procedure(s) Performed: RIGHT SHOULDER ARTHROSCOPY DEBRIDEMENT, ACROMIOPLASTY, DISTAL CLAVICAL EXCISION ROTATOR CUFF REPAIR (Right Shoulder)  Patient Location: PACU  Anesthesia Type:General and Regional  Level of Consciousness: drowsy, patient cooperative and responds to stimulation  Airway & Oxygen Therapy: Patient Spontanous Breathing and Patient connected to face mask oxygen  Post-op Assessment: Report given to RN and Post -op Vital signs reviewed and stable  Post vital signs: Reviewed and stable  Last Vitals:  Vitals Value Taken Time  BP 158/73 09/23/19 0920  Temp    Pulse 54 09/23/19 0921  Resp 24 09/23/19 0921  SpO2 100 % 09/23/19 0921  Vitals shown include unvalidated device data.  Last Pain:  Vitals:   09/23/19 0657  TempSrc: Oral  PainSc: 0-No pain         Complications: No complications documented.

## 2019-09-23 NOTE — Op Note (Signed)
Orthopaedic Surgery Operative Note (CSN: [id number])  Crystal Roberts  09/07/1955 Date of Surgery: 09/23/2019   Diagnoses:  Right shoulder acute on chronic rotator cuff tear and biceps rupture  Procedure: Arthroscopic extensive debridement Arthroscopic subacromial decompression Arthroscopic rotator cuff repair Arthroscopic distal clavicle excision   Operative Finding Exam under anesthesia: Full motion no limitation Articular space: No loose bodies, capsule was intact, labrum was frayed throughout this debrided back Chondral surfaces: Scattered grade 1 changes on the glenoid and the humerus, early signs of some impingement of the humeral head on the acromion  biceps: Chronically traumatically ruptured Subscapularis: Essentially full-thickness full width subscapularis tear which appeared to be relatively new.  This was repaired with 2 independent mattress stitches and 2 swivel lock anchors in a single row fashion. Superior Cuff: Full-thickness retracted supraspinatus tear to the level of the glenoid with poor tissue quality. Bursal side: As above  Successful completion of the planned procedure.  I have worries about this patient's long-term ability to heal her superior cuff.  Her subscapularis repair was relatively robust and it appeared to be a relatively new tear.  The superior cuff had clearly been torn for years and we are able to mobilize it and with a medialization of the tuberosity were able to get it back down to bone.  This was under some tension however and we worry about her overall outcomes.  She did do well with a nonoperatively managed chronic cuff tear however I am not sure if the balance of the tissues was the same.  If she fails the surgery her only option is a reverse shoulder arthroplasty if she desires more surgery.  Post-operative plan: The patient will be non-weightbearing in a sling for 6 weeks with therapy to start afterwards.  The patient will be discharged home.  DVT  prophylaxis not indicated in ambulatory upper extremity patient without known risk factors.   Pain control with PRN pain medication preferring oral medicines.  Follow up plan will be scheduled in approximately 7 days for incision check and XR.  Post-Op Diagnosis: Same Surgeons:Primary: Hiram Gash, MD Assistants:Caroline McBane PA-C Location: [facility] OR ROOM 6 Anesthesia: General with Exparel interscalene block Antibiotics: Ancef 2 g with local vancomycin powder 1 g at the surgical site Tourniquet time: None Estimated Blood Loss: Minimal Complications: None Specimens: None Implants: Implant Name Type Inv. Item Serial No. Manufacturer Lot No. LRB No. Used Action  ANCHOR SUT BIO SW 4.75X19.1 - [id number] Anchor ANCHOR SUT BIO SW 4.75X19.1  [employer] [id number] Right 1 Implanted  IMPLANT SPEEDBRIDGE KIT - [id number] Orthopedic Implant IMPLANT SPEEDBRIDGE KIT  [place] [id number] Right 1 Implanted  ANCHOR SUT BIO SW 4.75X19.1 - [id number] Anchor ANCHOR SUT BIO SW 4.75X19.1  [employer] [id number] Right 1 Implanted  ANCHOR SUT BIO SW 4.75X19.1 - [id number] Anchor ANCHOR SUT BIO SW 4.75X19.1  Rolinda Roan [id number] Right 1 Implanted    Indications for Surgery:   Crystal Roberts is a 64 y.o. female with continued shoulder pain refractory to nonoperative measures for extended period of time.  Patient had MRI that demonstrated an acute on chronic type rotator cuff tear and a history of a traumatic sounding event.  She had a new change in her function of her shoulder instability.  The risks and benefits were explained at length including but not limited to continued pain, cuff failure, biceps tenodesis failure, stiffness, need for further surgery and infection.  Need for arthroplasty, rerupture.   Procedure:   Patient was correctly  identified in the preoperative holding area and operative site marked.  Patient brought to OR and positioned beachchair on an [place] table ensuring that all bony prominences were  padded and the head was in an appropriate location.  Anesthesia was induced and the operative shoulder was prepped and draped in the usual sterile fashion.  Timeout was called preincision.  A standard posterior viewing portal was made after localizing the portal with a spinal needle.  An anterior accessory portal was also made.  After clearing the articular space the camera was positioned in the subacromial space.  Findings above.    Extensive debridement was performed of the anterior interval tissue, labral fraying and the bursa.  Subacromial decompression: We made a lateral portal with spinal needle guidance. We then proceeded to debride bursal tissue extensively with a shaver and arthrocare device. At that point we continued to identify the borders of the acromion and identify the spur. We then carefully preserved the deltoid fascia and used a burr to convert the acromion to a Type 1 flat acromion without issue.  Distal Clavicle resection:  The scope was placed in the subacromial space from the posterior portal.  A hemostat was placed through the anterior portal and we spread at the [facility] joint.  A burr was then inserted and 10 mm of distal clavicle was resected taking care to avoid damage to the capsule around the joint and avoiding overhanging bone posteriorly.    Subscapularis Repair: We identified a subscapularis tear that involved essentially the entire subscap.  Using a grasping device were able to demonstrate that the tendon could be reapproximated to the lesser tuberosity.  We felt that it was repairable.  We then used a RF ablator to open the rotator interval and released the MGHL to allow further translation of the subscapularis.  This point we cleared both anterior and posterior to the tendon taking care to avoid inferior migration to avoid the neurovascular structures as well as the muscular cutaneous nerve.  We then used a lasso to pass a fiber tape in a mattress fashion through the  subscapularis x2 and placed x2 4.75 swivel lock was used to repair back to the lesser tuberosity after prepping the tuberosity extensively.  We had good approximation of the tendon and a recreation of the rolled border.  Arthroscopic Rotator Cuff Repair: Tuberosity was prepared with a burr to a bleeding bed.  We medialized the tuberosity a few millimeters as the cuff tear was significantly retracted.  We placed a stay stitch in the cuff remnant and mobilized and used that for traction using a elevator to try and move and release tissue above and below the cuff about damaging the cuff tissue.  This was primarily her supraspinatus but the infraspinatus was involved as well.    Following completion of the above we placed 3 4.7 Swivelock anchor loaded with a tape at inserted at the medial articular margin and an scorpion suture passing device, shuttled  sutures medially in a horizontal mattress suture configuration.  We then tied using arthroscopic knot tying techniques  each suture to its partner reducing the tendon at the prepared insertion site.  The fiber tape was not tied. With a medial row suture limbs then incorporated, 3 anteriorly and  3 posteriorly, into each of two 4.75 PEEK SwiveLock anchors, each placed 8 to 10 mm below the tip of the tuberosity and spanning anterior-posterior width of the tear with care to avoid over tensioning.  We did place some safety  stitches from a lateral row to hold down dogears and reinforce the repair.  This was a difficult repair and has a high risk of failure however we were able to get good cuff down to bone.   The incisions were closed with absorbable monocryl and steri strips.  A sterile dressing was placed along with a sling. The patient was awoken from general anesthesia and taken to the PACU in stable condition without complication.   Noemi Chapel, PA-C, present and scrubbed throughout the case, critical for completion in a timely fashion, and for retraction,  instrumentation, closure.

## 2019-09-23 NOTE — Discharge Instructions (Signed)
Post Anesthesia Home Care Instructions  Activity: Get plenty of rest for the remainder of the day. A responsible individual must stay with you for 24 hours following the procedure.  For the next 24 hours, DO NOT: -Drive a car -Paediatric nurse -Drink alcoholic beverages -Take any medication unless instructed by your physician -Make any legal decisions or sign important papers.  Meals: Start with liquid foods such as gelatin or soup. Progress to regular foods as tolerated. Avoid greasy, spicy, heavy foods. If nausea and/or vomiting occur, drink only clear liquids until the nausea and/or vomiting subsides. Call your physician if vomiting continues.  Special Instructions/Symptoms: Your throat may feel dry or sore from the anesthesia or the breathing tube placed in your throat during surgery. If this causes discomfort, gargle with warm salt water. The discomfort should disappear within 24 hours.  If you had a scopolamine patch placed behind your ear for the management of post- operative nausea and/or vomiting:  1. The medication in the patch is effective for 72 hours, after which it should be removed.  Wrap patch in a tissue and discard in the trash. Wash hands thoroughly with soap and water. 2. You may remove the patch earlier than 72 hours if you experience unpleasant side effects which may include dry mouth, dizziness or visual disturbances. 3. Avoid touching the patch. Wash your hands with soap and water after contact with the patch.    Regional Anesthesia Blocks  1. Numbness or the inability to move the "blocked" extremity may last from 3-48 hours after placement. The length of time depends on the medication injected and your individual response to the medication. If the numbness is not going away after 48 hours, call your surgeon.  2. The extremity that is blocked will need to be protected until the numbness is gone and the  Strength has returned. Because you cannot feel it, you will  need to take extra care to avoid injury. Because it may be weak, you may have difficulty moving it or using it. You may not know what position it is in without looking at it while the block is in effect.  3. For blocks in the legs and feet, returning to weight bearing and walking needs to be done carefully. You will need to wait until the numbness is entirely gone and the strength has returned. You should be able to move your leg and foot normally before you try and bear weight or walk. You will need someone to be with you when you first try to ensure you do not fall and possibly risk injury.  4. Bruising and tenderness at the needle site are common side effects and will resolve in a few days.  5. Persistent numbness or new problems with movement should be communicated to the surgeon or the [facility] [phone] Media [phone]).   Information for Discharge Teaching: EXPAREL (bupivacaine liposome injectable suspension)   Your surgeon or anesthesiologist gave you EXPAREL(bupivacaine) to help control your pain after surgery.   EXPAREL is a local anesthetic that provides pain relief by numbing the tissue around the surgical site.  EXPAREL is designed to release pain medication over time and can control pain for up to 72 hours.  Depending on how you respond to EXPAREL, you may require less pain medication during your recovery.  Possible side effects:  Temporary loss of sensation or ability to move in the area where bupivacaine was injected.  Nausea, vomiting, constipation  Rarely, numbness  and tingling in your mouth or lips, lightheadedness, or anxiety may occur.  Call your doctor right away if you think you may be experiencing any of these sensations, or if you have other questions regarding possible side effects.  Follow all other discharge instructions given to you by your surgeon or nurse. Eat a healthy diet and drink plenty of water or other  fluids.  If you return to the hospital for any reason within 96 hours following the administration of EXPAREL, it is important for health care providers to know that you have received this anesthetic. A teal colored band has been placed on your arm with the date, time and amount of EXPAREL you have received in order to alert and inform your health care providers. Please leave this armband in place for the full 96 hours following administration, and then you may remove the band.  Next dose of Tylenol can be given at 1:15pm if needed.

## 2019-09-23 NOTE — Progress Notes (Signed)
Assisted Dr. Singer with right, ultrasound guided, interscalene  block. Side rails up, monitors on throughout procedure. See vital signs in flow sheet. Tolerated Procedure well. 

## 2019-09-23 NOTE — Anesthesia Procedure Notes (Signed)
Procedure Name: Intubation Date/Time: 09/23/2019 7:40 AM Performed by: Glory Buff, CRNA Pre-anesthesia Checklist: Patient identified, Emergency Drugs available, Suction available and Patient being monitored Patient Re-evaluated:Patient Re-evaluated prior to induction Oxygen Delivery Method: Circle system utilized Preoxygenation: Pre-oxygenation with 100% oxygen Induction Type: IV induction Ventilation: Mask ventilation without difficulty Laryngoscope Size: Miller and 3 Grade View: Grade I Tube type: Oral Tube size: 7.0 mm Number of attempts: 1 Airway Equipment and Method: Stylet and Oral airway Placement Confirmation: ETT inserted through vocal cords under direct vision,  positive ETCO2 and breath sounds checked- equal and bilateral Secured at: 20 cm Tube secured with: Tape Dental Injury: Teeth and Oropharynx as per pre-operative assessment

## 2019-09-23 NOTE — Anesthesia Procedure Notes (Signed)
Anesthesia Regional Block: Interscalene brachial plexus block   Pre-Anesthetic Checklist: ,, timeout performed, Correct Patient, Correct Site, Correct Laterality, Correct Procedure, Correct Position, site marked, Risks and benefits discussed,  Surgical consent,  Pre-op evaluation,  At surgeon's request and post-op pain management  Laterality: Right  Prep: chloraprep       Needles:  Injection technique: Single-shot  Needle Type: Echogenic Stimulator Needle     Needle Length: 5cm  Needle Gauge: 22     Additional Needles:   Narrative:  Start time: 09/23/2019 7:11 AM End time: 09/23/2019 7:21 AM Injection made incrementally with aspirations every 5 mL.  Performed by: Personally  Anesthesiologist: Duane Boston, MD  Additional Notes: Functioning IV was confirmed and monitors applied.  A 58mm 22ga echogenic arrow stimulator was used. Sterile prep and drape,hand hygiene and sterile gloves were used.Ultrasound guidance: relevant anatomy identified, needle position confirmed, local anesthetic spread visualized around nerve(s)., vascular puncture avoided.  Image printed for medical record.  Negative aspiration and negative test dose prior to incremental administration of local anesthetic. The patient tolerated the procedure well.

## 2019-09-24 ENCOUNTER — Encounter (HOSPITAL_BASED_OUTPATIENT_CLINIC_OR_DEPARTMENT_OTHER): Payer: Self-pay | Admitting: Orthopaedic Surgery

## 2019-10-01 DIAGNOSIS — M19011 Primary osteoarthritis, right shoulder: Secondary | ICD-10-CM | POA: Diagnosis not present

## 2019-10-11 MED FILL — FUROSEMIDE 40 MG TAB: 40 | 90 days supply | Qty: 90 | Fill #1

## 2019-10-11 MED FILL — LOSARTAN-HCTZ 100-25 MG TAB: 100-25 | 90 days supply | Qty: 90 | Fill #1

## 2019-10-11 MED FILL — FLUoxetine HCL 20 MG CAPS: 20 | 90 days supply | Qty: 90 | Fill #1

## 2019-10-11 MED FILL — LEVOTHYROXINE 75 MCG TABLET: 75 | 90 days supply | Qty: 90 | Fill #0

## 2019-10-15 DIAGNOSIS — M19011 Primary osteoarthritis, right shoulder: Secondary | ICD-10-CM | POA: Diagnosis not present

## 2019-10-28 ENCOUNTER — Ambulatory Visit (INDEPENDENT_AMBULATORY_CARE_PROVIDER_SITE_OTHER): Payer: 59 | Admitting: Bariatrics

## 2019-10-28 ENCOUNTER — Ambulatory Visit (HOSPITAL_COMMUNITY)
Admission: RE | Admit: 2019-10-28 | Discharge: 2019-10-28 | Disposition: A | Discharge status: Home / Self Care | Payer: 59 | Source: Ambulatory Visit | Attending: Family Medicine | Admitting: Family Medicine

## 2019-10-28 ENCOUNTER — Other Ambulatory Visit: Payer: Self-pay

## 2019-10-28 ENCOUNTER — Other Ambulatory Visit (HOSPITAL_COMMUNITY): Payer: Self-pay | Admitting: Family Medicine

## 2019-10-28 ENCOUNTER — Encounter (INDEPENDENT_AMBULATORY_CARE_PROVIDER_SITE_OTHER): Payer: Self-pay | Admitting: Bariatrics

## 2019-10-28 VITALS — BP 151/80 | HR 70 | Temp 98.6°F | Ht 64.0 in | Wt 241.0 lb

## 2019-10-28 DIAGNOSIS — E039 Hypothyroidism, unspecified: Secondary | ICD-10-CM | POA: Diagnosis not present

## 2019-10-28 DIAGNOSIS — S52511A Displaced fracture of right radial styloid process, initial encounter for closed fracture: Secondary | ICD-10-CM | POA: Diagnosis not present

## 2019-10-28 DIAGNOSIS — M1991 Primary osteoarthritis, unspecified site: Secondary | ICD-10-CM | POA: Diagnosis not present

## 2019-10-28 DIAGNOSIS — E7849 Other hyperlipidemia: Secondary | ICD-10-CM | POA: Diagnosis not present

## 2019-10-28 DIAGNOSIS — Z6841 Body Mass Index (BMI) 40.0 and over, adult: Secondary | ICD-10-CM | POA: Insufficient documentation

## 2019-10-28 DIAGNOSIS — E038 Other specified hypothyroidism: Secondary | ICD-10-CM

## 2019-10-28 DIAGNOSIS — Z23 Encounter for immunization: Secondary | ICD-10-CM | POA: Diagnosis not present

## 2019-10-28 DIAGNOSIS — M7989 Other specified soft tissue disorders: Secondary | ICD-10-CM | POA: Diagnosis not present

## 2019-10-28 DIAGNOSIS — X58XXXA Exposure to other specified factors, initial encounter: Secondary | ICD-10-CM | POA: Diagnosis not present

## 2019-10-28 DIAGNOSIS — E119 Type 2 diabetes mellitus without complications: Secondary | ICD-10-CM | POA: Diagnosis not present

## 2019-10-28 DIAGNOSIS — I10 Essential (primary) hypertension: Secondary | ICD-10-CM | POA: Insufficient documentation

## 2019-10-28 DIAGNOSIS — M25531 Pain in right wrist: Secondary | ICD-10-CM | POA: Diagnosis not present

## 2019-10-29 DIAGNOSIS — M25531 Pain in right wrist: Secondary | ICD-10-CM | POA: Diagnosis not present

## 2019-11-01 ENCOUNTER — Encounter (INDEPENDENT_AMBULATORY_CARE_PROVIDER_SITE_OTHER): Payer: Self-pay | Admitting: Bariatrics

## 2019-11-01 NOTE — Progress Notes (Signed)
Chief Complaint:   OBESITY Crystal Roberts is here to discuss her progress with her obesity treatment plan along with follow-up of her obesity related diagnoses. Crystal Roberts is on the Category 2 Plan and states she is following her eating plan approximately 0% of the time. Crystal Roberts states she is exercising 0 minutes 0 times per week.  Today's visit was #: 14 Starting weight: 241 lbs Starting date: 09/10/2017 Today's weight: 241 lbs Today's date: 10/28/2019 Total lbs lost to date: 0 Total lbs lost since last in-office visit: 0  Interim History: Crystal Roberts is up 14 lbs since her last visit. She was last seen on 06/03/2019 and has not been following any plan.  Subjective:   Essential hypertension. Crystal Roberts is taking Hyzaar. Blood pressure was 130/70 at her PCP's office. It is slightly elevated today. She took her medication this a.m.  BP Readings from Last 3 Encounters:  10/28/19 (!) 151/80  09/23/19 (!) 146/67  09/03/19 (!) 162/79   Lab Results  Component Value Date   CREATININE 0.95 09/21/2019   CREATININE 0.87 11/18/2017   CREATININE 0.81 09/10/2017   Other specified hypothyroidism. Crystal Roberts is taking Synthroid.   Lab Results  Component Value Date   TSH 3.390 09/10/2017   Assessment/Plan:   Essential hypertension. Crystal Roberts is working on healthy weight loss and exercise to improve blood pressure control. We will watch for signs of hypotension as she continues her lifestyle modifications. She will continue her medication as directed.   Other specified hypothyroidism. Patient with long-standing hypothyroidism, on levothyroxine therapy. She appears euthyroid. Orders and follow up as documented in patient record. Crystal Roberts will continue Synthroid as directed.   Counseling . Good thyroid control is important for overall health. Supratherapeutic thyroid levels are dangerous and will not improve weight loss results. . The correct way to take levothyroxine is fasting, with water, separated by at least  30 minutes from breakfast, and separated by more than 4 hours from calcium, iron, multivitamins, acid reflux medications (PPIs).   Class 3 severe obesity with serious comorbidity and body mass index (BMI) of 40.0 to 44.9 in adult, unspecified obesity type ([place]).  Crystal Roberts is currently in the action stage of change. As such, her goal is to continue with weight loss efforts. She has agreed to the Category 2 Plan alternating with the [place].   She will work on meal planning and mindful eating.   IC will be performed at her next visit.  Exercise goals: All adults should avoid inactivity. Some physical activity is better than none, and adults who participate in any amount of physical activity gain some health benefits.  Behavioral modification strategies: increasing lean protein intake, decreasing simple carbohydrates, increasing vegetables, increasing water intake, decreasing eating out, no skipping meals, meal planning and cooking strategies, keeping healthy foods in the home and planning for success.  Crystal Roberts has agreed to follow-up with our clinic in 4 weeks. She was informed of the importance of frequent follow-up visits to maximize her success with intensive lifestyle modifications for her multiple health conditions.   Objective:   Blood pressure (!) 151/80, pulse 70, temperature 98.6 F (37 C), height 5\' 4"  (1.626 m), weight 241 lb (109.3 kg), SpO2 98 %. Body mass index is 41.37 kg/m.  General: Cooperative, alert, well developed, in no acute distress. HEENT: Conjunctivae and lids unremarkable. Cardiovascular: Regular rhythm.  Lungs: Normal work of breathing. Neurologic: No focal deficits.  Extremities: Pt has right arm in a sling (saw PCP this a.m.)  Lab Results  Component Value Date   CREATININE 0.95 09/21/2019   BUN 23 09/21/2019   NA 140 09/21/2019   K 3.7 09/21/2019   CL 105 09/21/2019   CO2 26 09/21/2019   Lab Results  Component Value Date   ALT 26 09/10/2017    AST 25 09/10/2017   ALKPHOS 82 09/10/2017   BILITOT 0.3 09/10/2017   Lab Results  Component Value Date   HGBA1C 6.0 (H) 11/18/2017   HGBA1C 6.1 (H) 09/10/2017   HGBA1C 5.8 (H) 08/13/2016   Lab Results  Component Value Date   INSULIN 27.5 (H) 09/10/2017   Lab Results  Component Value Date   TSH 3.390 09/10/2017   Lab Results  Component Value Date   CHOL 171 09/10/2017   HDL 57 09/10/2017   LDLCALC 93 09/10/2017   TRIG 105 09/10/2017   Lab Results  Component Value Date   WBC 8.6 11/18/2017   HGB 13.9 11/18/2017   HCT 43.8 11/18/2017   MCV 93.8 11/18/2017   PLT 361 11/18/2017   No results found for: IRON, TIBC, FERRITIN  Attestation Statements:   Reviewed by clinician on day of visit: allergies, medications, problem list, medical history, surgical history, family history, social history, and previous encounter notes.  Time spent on visit including pre-visit chart review and post-visit charting and care was 20 minutes.   Migdalia Dk, am acting as Location manager for [employer], DO   I have reviewed the above documentation for accuracy and completeness, and I agree with the above. Jearld Lesch, DO

## 2019-11-03 ENCOUNTER — Ambulatory Visit: Payer: 59 | Attending: Orthopaedic Surgery | Admitting: Physical Therapy

## 2019-11-03 ENCOUNTER — Other Ambulatory Visit: Payer: Self-pay

## 2019-11-03 DIAGNOSIS — M25611 Stiffness of right shoulder, not elsewhere classified: Secondary | ICD-10-CM | POA: Insufficient documentation

## 2019-11-03 DIAGNOSIS — M25511 Pain in right shoulder: Secondary | ICD-10-CM | POA: Insufficient documentation

## 2019-11-03 NOTE — Therapy (Signed)
[facility] [place], [place], [address] Phone: [phone]   Fax:  [phone]  Physical Therapy Evaluation  Patient Details  Name: Crystal Roberts MRN: [id number] Date of Birth: 08-08-1955 Referring Provider (PT): Ophelia Charter MD   Encounter Date: 11/03/2019   PT End of Session - 11/03/19 1501    Visit Number 1    Number of Visits 16    Date for PT Re-Evaluation 12/29/19    Authorization Type FOTO.    PT Start Time 0945    PT Stop Time 1033    PT Time Calculation (min) 48 min           Past Medical History:  Diagnosis Date  . Anxiety   . Arthritis    neck- cervical disc herniation   . Asthma    as child  . Back pain   . Cancer ([place])    skin - low back , insitu  melanoma   . Constipation   . COVID-19 09/07/2019   sx fever/cough/chills/lost taste and smell  . Depression   . Diabetes mellitus without complication ([place])   . Dyspnea   . Gallbladder problem   . GERD (gastroesophageal reflux disease)   . H/O cardiovascular stress test 1990   pt. reports that she was having a lot of stress & she was seen by cardiac- had stress test & told that it was negative   . Hyperlipidemia   . Hypertension   . Hypothyroidism   . Leg edema   . Pulmonary embolism ([place]) [place]. at [facility]- on blood thinning med., thought to be related to lack of activity, smoking & BCP    Past Surgical History:  Procedure Laterality Date  . ANTERIOR CERVICAL DECOMP/DISCECTOMY FUSION N/A 08/16/2016   Procedure: Cervical Four-Five Anterior cervical decompression/discectomy/fusion;  Surgeon: Erline Levine, MD;  Location: [place];  Service: Neurosurgery;  Laterality: N/A;  C4-5 Anterior cervical decompression/discectomy/fusion  . BACK SURGERY     sebaceous cyst removed  . CHOLECYSTECTOMY  5361   umbilical hernia ------ followed cholecystectomy  . Cyst removed from back    . CYSTECTOMY Bilateral    cyst on ovaries  . HERNIA REPAIR      umbilical hernia repair   . MASS EXCISION N/A 11/21/2017   Procedure: EXCISION SEBACEOUS CYST ON BACK;  Surgeon: Aviva Signs, MD;  Location: [facility];  Service: General;  Laterality: N/A;  . SHOULDER ARTHROSCOPY WITH ROTATOR CUFF REPAIR AND SUBACROMIAL DECOMPRESSION Right 09/23/2019   Procedure: RIGHT SHOULDER ARTHROSCOPY DEBRIDEMENT, ACROMIOPLASTY, DISTAL CLAVICAL EXCISION ROTATOR CUFF REPAIR;  Surgeon: Hiram Gash, MD;  Location: [place];  Service: Orthopedics;  Laterality: Right;  . TUBAL LIGATION      There were no vitals filed for this visit.    Subjective Assessment - 11/03/19 1454    Subjective COVID-19 screen performed prior to patient entering clinic.  The patient presents to the clinic today s/p right shoulder SAD, DCR and repair of massive RTC tear performed on 09/23/19.  She is oout of her sling now.  She also had a right brace on as she feel on her outstretched right hand and fractured her wrist.  She hopes she did not harm her shoulder.  Doctor is aware.  Her pain at rest today is a low 1/10 but can rise to higher levels with movement of her right shoulder.    Pertinent History Hypothyroidism, DM, h/o back pain, hernia repair, SCDF, OA.  Patient Stated Goals Use right arm without difficulty.    Currently in Pain? Yes    Pain Score 1     Pain Location Shoulder    Pain Orientation Right              [facility] PT Assessment - 11/03/19 0001      Assessment   Medical Diagnosis RT shld DCR, SAD, massive RTC repair.    Referring Provider (PT) Ophelia Charter MD    Onset Date/Surgical Date --   09/23/19 (surgery date).     Precautions   Precaution Comments Follow protocol.      Restrictions   Other Position/Activity Restrictions No right UE weight bearing.      Balance Screen   Has the patient fallen in the past 6 months Yes    How many times? --   2.   Has the patient had a decrease in activity level because of a fear of falling?  No    Is the patient reluctant  to leave their home because of a fear of falling?  No      Home Environment   Living Environment Private residence      Prior Function   Level of Independence Independent      Observation/Other Assessments   Observations Right shoulder incisional sites look to bea well healed.      Observation/Other Assessments-Edema    Edema --   Minimal right shoulder edema.     Posture/Postural Control   Posture Comments Guarded.      ROM / Strength   AROM / PROM / Strength PROM      PROM   Overall PROM Comments In supine:  Passive right shoulder flexion to 93 degrees and Er to 40 degrees.      Palpation   Palpation comment Tender to palpation over patient's right anterior shoulder and middle deltoid.                      Objective measurements completed on examination: See above findings.       [facility] Adult PT Treatment/Exercise - 11/03/19 0001      Modalities   Modalities Electrical Stimulation;Vasopneumatic      Electrical Stimulation   Electrical Stimulation Location Right shoulder.    Electrical Stimulation Action IFC (non-motoric)    Electrical Stimulation Parameters 80-150 Hz x 20 minutes.    Electrical Stimulation Goals Edema;Pain      Vasopneumatic   Number Minutes Vasopneumatic  20 minutes    Vasopnuematic Location  --   LT shoulder with pillow between elbow and thorax.   Vasopneumatic Pressure Low                       PT Long Term Goals - 11/03/19 1516      PT LONG TERM GOAL #1   Title Independent with a HEP.    Time 8    Period Weeks    Status New      PT LONG TERM GOAL #2   Title Active right shoulder flexion to 145 degrees so the patient can easily reach overhead.    Time 8    Period Weeks    Status New      PT LONG TERM GOAL #3   Title Active ER to 70 degrees+ to allow for easily donning/doffing of apparel.    Time 8    Period Weeks    Status New  PT LONG TERM GOAL #4   Title Increase ROM so patient is able to reach  behind back to L4.    Time 8    Period Weeks    Status New      PT LONG TERM GOAL #5   Title Increase shoulder strength to a solid 4 to 4+/5 to increase stability for performance of functional activities.    Time 8    Period Weeks    Status New      PT LONG TERM GOAL #6   Title Perform ADL's with pain not > 3/10.    Time 8    Period Weeks                  Plan - 11/03/19 1507    Clinical Impression Statement The patient presents to [facility] s/p right shoulder SAD, DCR and massive RTC repair performed on 09/23/19. She has an expected loss of range of motion.  She is tender over her right anterior shoulder region and middle deltoid region.  She fell recently on an outstretched right UE and fractured her wrist and has a wrist brace donned.  She is out of her shoulder sling at this time.  Patient will benefit from skilled physical therapy intervention to address deficits and pain.    Personal Factors and Comorbidities Comorbidity 1;Comorbidity 2    Comorbidities Hypothyroidism, DM, h/o back pain, hernia repair, SCDF, OA.    Examination-Activity Limitations Reach Overhead;Other    Examination-Participation Restrictions Other    Stability/Clinical Decision Making Stable/Uncomplicated    Clinical Decision Making Low    Rehab Potential Excellent    PT Frequency 2x / week    PT Duration 8 weeks    PT Treatment/Interventions ADLs/Self Care Home Management;Cryotherapy;Electrical Stimulation;Ultrasound;Moist Heat;Therapeutic activities;Therapeutic exercise;Patient/family education;Passive range of motion;Vasopneumatic Device    PT Next Visit Plan Please do FOTO.  Begin with gentle right shoulder PROM.  Non-motoric electrical stimulation and vasopneumatic with pillow between elbow and thorax.    Consulted and Agree with Plan of Care Patient           Patient will benefit from skilled therapeutic intervention in order to improve the following deficits and impairments:  Pain, Decreased  activity tolerance, Increased edema, Decreased range of motion  Visit Diagnosis: Acute pain of right shoulder - Plan: PT plan of care cert/re-cert  Stiffness of right shoulder, not elsewhere classified - Plan: PT plan of care cert/re-cert     Problem List Patient Active Problem List   Diagnosis Date Noted  . Sebaceous cyst   . Class 2 severe obesity with serious comorbidity and body mass index (BMI) of 35.0 to 35.9 in adult ([place]) 10/27/2017  . Other fatigue 09/10/2017  . Shortness of breath on exertion 09/10/2017  . Type 2 diabetes mellitus without complication, without long-term current use of insulin ([place]) 09/10/2017  . Essential hypertension 09/10/2017  . Other hyperlipidemia 09/10/2017  . Other specified hypothyroidism 09/10/2017  . Herniated cervical disc 08/16/2016    Mafalda Mcginniss, [place] MPT 11/03/2019, 3:20 PM  [facility] [place], [place], [address] Phone: [phone]   Fax:  [phone]  Name: ORALIA CRIGER MRN: [id number] Date of Birth: [birth year]

## 2019-11-05 ENCOUNTER — Ambulatory Visit: Payer: 59 | Admitting: Physical Therapy

## 2019-11-05 ENCOUNTER — Other Ambulatory Visit: Payer: Self-pay

## 2019-11-05 ENCOUNTER — Encounter: Payer: Self-pay | Admitting: Physical Therapy

## 2019-11-05 DIAGNOSIS — M25611 Stiffness of right shoulder, not elsewhere classified: Secondary | ICD-10-CM

## 2019-11-05 DIAGNOSIS — M25531 Pain in right wrist: Secondary | ICD-10-CM | POA: Diagnosis not present

## 2019-11-05 DIAGNOSIS — M25511 Pain in right shoulder: Secondary | ICD-10-CM

## 2019-11-05 NOTE — Therapy (Signed)
[facility] [place], [place], [address] Phone: [phone]   Fax:  [phone]  Physical Therapy Treatment  Patient Details  Name: Crystal Roberts MRN: [id number] Date of Birth: Dec 01, 1955 Referring Provider (PT): Ophelia Charter MD   Encounter Date: 11/05/2019   PT End of Session - 11/05/19 0811    Visit Number 2    Number of Visits 16    Date for PT Re-Evaluation 12/29/19    Authorization Type FOTO.    PT Start Time [phone]    PT Stop Time 0819    PT Time Calculation (min) 36 min    Activity Tolerance Patient tolerated treatment well    Behavior During Therapy [facility] for tasks assessed/performed           Past Medical History:  Diagnosis Date  . Anxiety   . Arthritis    neck- cervical disc herniation   . Asthma    as child  . Back pain   . Cancer ([place])    skin - low back , insitu  melanoma   . Constipation   . COVID-19 09/07/2019   sx fever/cough/chills/lost taste and smell  . Depression   . Diabetes mellitus without complication ([place])   . Dyspnea   . Gallbladder problem   . GERD (gastroesophageal reflux disease)   . H/O cardiovascular stress test 1990   pt. reports that she was having a lot of stress & she was seen by cardiac- had stress test & told that it was negative   . Hyperlipidemia   . Hypertension   . Hypothyroidism   . Leg edema   . Pulmonary embolism ([place]) [place]. at [facility]- on blood thinning med., thought to be related to lack of activity, smoking & BCP    Past Surgical History:  Procedure Laterality Date  . ANTERIOR CERVICAL DECOMP/DISCECTOMY FUSION N/A 08/16/2016   Procedure: Cervical Four-Five Anterior cervical decompression/discectomy/fusion;  Surgeon: Erline Levine, MD;  Location: [place];  Service: Neurosurgery;  Laterality: N/A;  C4-5 Anterior cervical decompression/discectomy/fusion  . BACK SURGERY     sebaceous cyst removed  . CHOLECYSTECTOMY  4081   umbilical hernia ------ followed  cholecystectomy  . Cyst removed from back    . CYSTECTOMY Bilateral    cyst on ovaries  . HERNIA REPAIR     umbilical hernia repair   . MASS EXCISION N/A 11/21/2017   Procedure: EXCISION SEBACEOUS CYST ON BACK;  Surgeon: Aviva Signs, MD;  Location: [facility];  Service: General;  Laterality: N/A;  . SHOULDER ARTHROSCOPY WITH ROTATOR CUFF REPAIR AND SUBACROMIAL DECOMPRESSION Right 09/23/2019   Procedure: RIGHT SHOULDER ARTHROSCOPY DEBRIDEMENT, ACROMIOPLASTY, DISTAL CLAVICAL EXCISION ROTATOR CUFF REPAIR;  Surgeon: Hiram Gash, MD;  Location: [place];  Service: Orthopedics;  Laterality: Right;  . TUBAL LIGATION      There were no vitals filed for this visit.   Subjective Assessment - 11/05/19 0810    Subjective COVID 19 screening performed on patient upon arrival. Patient reports intermittant pain and spasms. Patient states that as the day progresses then she can raise her RUE farther.    Pertinent History Hypothyroidism, DM, h/o back pain, hernia repair, SCDF, OA.    Patient Stated Goals Use right arm without difficulty.    Currently in Pain? Yes    Pain Score 2     Pain Location Shoulder    Pain Orientation Right    Pain Descriptors / Indicators Discomfort    Pain  Type Acute pain    Pain Onset More than a month ago    Pain Frequency Intermittent              [facility] PT Assessment - 11/05/19 0001      Assessment   Medical Diagnosis RT shld DCR, SAD, massive RTC repair.    Referring Provider (PT) Ophelia Charter MD    Onset Date/Surgical Date 09/23/19    Next MD Visit 11/05/2019      Precautions   Precaution Comments Follow protocol.      Restrictions   Other Position/Activity Restrictions No right UE weight bearing.      Observation/Other Assessments   Focus on Therapeutic Outcomes (FOTO)  37%, CJ status                         [facility] Adult PT Treatment/Exercise - 11/05/19 0001      Modalities   Modalities Electrical Stimulation;Vasopneumatic       Acupuncturist Location R shoulder    Electrical Stimulation Action Pre-Mod    Electrical Stimulation Parameters 80-150 hz x10 min    Electrical Stimulation Goals Edema;Pain      Vasopneumatic   Number Minutes Vasopneumatic  10 minutes    Vasopnuematic Location  Shoulder    Vasopneumatic Pressure Low    Vasopneumatic Temperature  34/edema and pain      Manual Therapy   Manual Therapy Passive ROM    Passive ROM PROM of R shoulder into flexion, ER, IR with holds at end range                       PT Long Term Goals - 11/05/19 6767      PT LONG TERM GOAL #1   Title Independent with a HEP.    Time 8    Period Weeks    Status On-going      PT LONG TERM GOAL #2   Title Active right shoulder flexion to 145 degrees so the patient can easily reach overhead.    Time 8    Period Weeks    Status On-going      PT LONG TERM GOAL #3   Title Active ER to 70 degrees+ to allow for easily donning/doffing of apparel.    Time 8    Period Weeks    Status On-going      PT LONG TERM GOAL #4   Title Increase ROM so patient is able to reach behind back to L4.    Time 8    Period Weeks    Status On-going      PT LONG TERM GOAL #5   Title Increase shoulder strength to a solid 4 to 4+/5 to increase stability for performance of functional activities.    Time 8    Period Weeks    Status On-going      PT LONG TERM GOAL #6   Title Perform ADL's with pain not > 3/10.    Time 8    Period Weeks    Status On-going                 Plan - 11/05/19 0813    Clinical Impression Statement Patient presented in clinic with reports of intermittant spasms and discomfort. Patient able to tolerate PROM of R shoulder with only initial discomfort at end range flexion but no other complaints. Firm end feels and smooth arc of motion  noted during PROM of R shoulder. Patient advised to avoid adhesive capsulitis. Normal modalities response noted following  removal of the modalities.    Personal Factors and Comorbidities Comorbidity 1;Comorbidity 2    Comorbidities Hypothyroidism, DM, h/o back pain, hernia repair, SCDF, OA.    Examination-Activity Limitations Reach Overhead;Other    Examination-Participation Restrictions Other    Stability/Clinical Decision Making Stable/Uncomplicated    Rehab Potential Excellent    PT Frequency 2x / week    PT Duration 8 weeks    PT Treatment/Interventions ADLs/Self Care Home Management;Cryotherapy;Electrical Stimulation;Ultrasound;Moist Heat;Therapeutic activities;Therapeutic exercise;Patient/family education;Passive range of motion;Vasopneumatic Device    PT Next Visit Plan Begin with gentle right shoulder PROM.  Non-motoric electrical stimulation and vasopneumatic with pillow between elbow and thorax.    Consulted and Agree with Plan of Care Patient           Patient will benefit from skilled therapeutic intervention in order to improve the following deficits and impairments:  Pain, Decreased activity tolerance, Increased edema, Decreased range of motion  Visit Diagnosis: Acute pain of right shoulder  Stiffness of right shoulder, not elsewhere classified     Problem List Patient Active Problem List   Diagnosis Date Noted  . Sebaceous cyst   . Class 2 severe obesity with serious comorbidity and body mass index (BMI) of 35.0 to 35.9 in adult ([place]) 10/27/2017  . Other fatigue 09/10/2017  . Shortness of breath on exertion 09/10/2017  . Type 2 diabetes mellitus without complication, without long-term current use of insulin ([place]) 09/10/2017  . Essential hypertension 09/10/2017  . Other hyperlipidemia 09/10/2017  . Other specified hypothyroidism 09/10/2017  . Herniated cervical disc 08/16/2016    Standley Brooking, PTA 11/05/2019, 9:02 AM  [facility] [place], [place], [address] Phone: [phone]   Fax:  [phone]  Name: KYLAN VEACH MRN: [id number] Date of Birth: Aug 11, 1955

## 2019-11-09 ENCOUNTER — Ambulatory Visit: Payer: 59 | Admitting: Physical Therapy

## 2019-11-09 ENCOUNTER — Other Ambulatory Visit: Payer: Self-pay

## 2019-11-09 ENCOUNTER — Encounter: Payer: Self-pay | Admitting: Physical Therapy

## 2019-11-09 DIAGNOSIS — M25511 Pain in right shoulder: Secondary | ICD-10-CM | POA: Diagnosis not present

## 2019-11-09 DIAGNOSIS — M25611 Stiffness of right shoulder, not elsewhere classified: Secondary | ICD-10-CM

## 2019-11-09 NOTE — Therapy (Signed)
[facility] [place], [place], [address] Phone: [phone]   Fax:  [phone]  Physical Therapy Treatment  Patient Details  Name: Crystal Roberts MRN: [id number] Date of Birth: 07-Feb-1955 Referring Provider (PT): Ophelia Charter MD   Encounter Date: 11/09/2019   PT End of Session - 11/09/19 0810    Visit Number 3    Number of Visits 16    Date for PT Re-Evaluation 12/29/19    Authorization Type FOTO.    PT Start Time [phone]    PT Stop Time 0822    PT Time Calculation (min) 40 min    Activity Tolerance Patient tolerated treatment well    Behavior During Therapy [facility] for tasks assessed/performed           Past Medical History:  Diagnosis Date  . Anxiety   . Arthritis    neck- cervical disc herniation   . Asthma    as child  . Back pain   . Cancer ([place])    skin - low back , insitu  melanoma   . Constipation   . COVID-19 09/07/2019   sx fever/cough/chills/lost taste and smell  . Depression   . Diabetes mellitus without complication ([place])   . Dyspnea   . Gallbladder problem   . GERD (gastroesophageal reflux disease)   . H/O cardiovascular stress test 1990   pt. reports that she was having a lot of stress & she was seen by cardiac- had stress test & told that it was negative   . Hyperlipidemia   . Hypertension   . Hypothyroidism   . Leg edema   . Pulmonary embolism ([place]) [place]. at [facility]- on blood thinning med., thought to be related to lack of activity, smoking & BCP    Past Surgical History:  Procedure Laterality Date  . ANTERIOR CERVICAL DECOMP/DISCECTOMY FUSION N/A 08/16/2016   Procedure: Cervical Four-Five Anterior cervical decompression/discectomy/fusion;  Surgeon: Erline Levine, MD;  Location: [place];  Service: Neurosurgery;  Laterality: N/A;  C4-5 Anterior cervical decompression/discectomy/fusion  . BACK SURGERY     sebaceous cyst removed  . CHOLECYSTECTOMY  0277   umbilical hernia ------ followed  cholecystectomy  . Cyst removed from back    . CYSTECTOMY Bilateral    cyst on ovaries  . HERNIA REPAIR     umbilical hernia repair   . MASS EXCISION N/A 11/21/2017   Procedure: EXCISION SEBACEOUS CYST ON BACK;  Surgeon: Aviva Signs, MD;  [facility];  Service: General;  Laterality: N/A;  . SHOULDER ARTHROSCOPY WITH ROTATOR CUFF REPAIR AND SUBACROMIAL DECOMPRESSION Right 09/23/2019   Procedure: RIGHT SHOULDER ARTHROSCOPY DEBRIDEMENT, ACROMIOPLASTY, DISTAL CLAVICAL EXCISION ROTATOR CUFF REPAIR;  Surgeon: Hiram Gash, MD;  Location: [place];  Service: Orthopedics;  Laterality: Right;  . TUBAL LIGATION      There were no vitals filed for this visit.   Subjective Assessment - 11/09/19 0808    Subjective COVID 19 screening performed on patient upon arrival. Patient reports soreness of R shoulder as she is using her arm more than usual. Still sleeping in the recliner at this time.    Pertinent History Hypothyroidism, DM, h/o back pain, hernia repair, SCDF, OA.    Patient Stated Goals Use right arm without difficulty.    Currently in Pain? Yes    Pain Score 2     Pain Location Shoulder    Pain Orientation Right    Pain Descriptors / Indicators Sore  Pain Type Acute pain    Pain Onset More than a month ago    Pain Frequency Intermittent              [facility] PT Assessment - 11/09/19 0001      Assessment   Medical Diagnosis RT shld DCR, SAD, massive RTC repair.    Referring Provider (PT) Ophelia Charter MD    Onset Date/Surgical Date 09/23/19    Next MD Visit 11/2019      Precautions   Precaution Comments Follow protocol.                         [facility] Adult PT Treatment/Exercise - 11/09/19 0001      Modalities   Modalities Vasopneumatic      Vasopneumatic   Number Minutes Vasopneumatic  15 minutes    Vasopnuematic Location  Shoulder    Vasopneumatic Pressure Low    Vasopneumatic Temperature  34/edema and pain      Manual Therapy    Manual Therapy Passive ROM    Passive ROM PROM of R shoulder into flexion, ER, IR with holds at end range                       PT Long Term Goals - 11/05/19 3976      PT LONG TERM GOAL #1   Title Independent with a HEP.    Time 8    Period Weeks    Status On-going      PT LONG TERM GOAL #2   Title Active right shoulder flexion to 145 degrees so the patient can easily reach overhead.    Time 8    Period Weeks    Status On-going      PT LONG TERM GOAL #3   Title Active ER to 70 degrees+ to allow for easily donning/doffing of apparel.    Time 8    Period Weeks    Status On-going      PT LONG TERM GOAL #4   Title Increase ROM so patient is able to reach behind back to L4.    Time 8    Period Weeks    Status On-going      PT LONG TERM GOAL #5   Title Increase shoulder strength to a solid 4 to 4+/5 to increase stability for performance of functional activities.    Time 8    Period Weeks    Status On-going      PT LONG TERM GOAL #6   Title Perform ADL's with pain not > 3/10.    Time 8    Period Weeks    Status On-going                 Plan - 11/09/19 0825    Clinical Impression Statement Patient presented in clinic with reports of R shoulder soreness and sometimes radiating into R UT. Firm end feels and smooth arc of motion noted during PROM of R shoulder in all directions. Min to moderate discomfort through RUE during PROM ER per patient report but tolerable. Normal vasopneumatic response noted following removal of the modality.    Personal Factors and Comorbidities Comorbidity 1;Comorbidity 2    Comorbidities Hypothyroidism, DM, h/o back pain, hernia repair, SCDF, OA.    Examination-Activity Limitations Reach Overhead;Other    Examination-Participation Restrictions Other    Stability/Clinical Decision Making Stable/Uncomplicated    Rehab Potential Excellent    PT Frequency 2x /  week    PT Duration 8 weeks    PT Treatment/Interventions ADLs/Self  Care Home Management;Cryotherapy;Electrical Stimulation;Ultrasound;Moist Heat;Therapeutic activities;Therapeutic exercise;Patient/family education;Passive range of motion;Vasopneumatic Device    PT Next Visit Plan Begin with gentle right shoulder PROM.  Non-motoric electrical stimulation and vasopneumatic with pillow between elbow and thorax.    Consulted and Agree with Plan of Care Patient           Patient will benefit from skilled therapeutic intervention in order to improve the following deficits and impairments:  Pain, Decreased activity tolerance, Increased edema, Decreased range of motion  Visit Diagnosis: Acute pain of right shoulder  Stiffness of right shoulder, not elsewhere classified     Problem List Patient Active Problem List   Diagnosis Date Noted  . Sebaceous cyst   . Class 2 severe obesity with serious comorbidity and body mass index (BMI) of 35.0 to 35.9 in adult ([place]) 10/27/2017  . Other fatigue 09/10/2017  . Shortness of breath on exertion 09/10/2017  . Type 2 diabetes mellitus without complication, without long-term current use of insulin ([place]) 09/10/2017  . Essential hypertension 09/10/2017  . Other hyperlipidemia 09/10/2017  . Other specified hypothyroidism 09/10/2017  . Herniated cervical disc 08/16/2016    Standley Brooking, PTA 11/09/2019, 8:27 AM  [facility] [place], [place], [address] Phone: [phone]   Fax:  [phone]  Name: TASHEEMA PERRONE MRN: [id number] Date of Birth: 02-Nov-1955

## 2019-11-12 ENCOUNTER — Other Ambulatory Visit: Payer: Self-pay

## 2019-11-12 ENCOUNTER — Ambulatory Visit: Payer: 59 | Admitting: Physical Therapy

## 2019-11-12 ENCOUNTER — Encounter: Payer: Self-pay | Admitting: Physical Therapy

## 2019-11-12 DIAGNOSIS — M25511 Pain in right shoulder: Secondary | ICD-10-CM | POA: Diagnosis not present

## 2019-11-12 DIAGNOSIS — M25611 Stiffness of right shoulder, not elsewhere classified: Secondary | ICD-10-CM

## 2019-11-12 NOTE — Therapy (Signed)
[facility] [place], [place], [address] Phone: [phone]   Fax:  [phone]  Physical Therapy Treatment  Patient Details  Name: Crystal Roberts MRN: [id number] Date of Birth: 05-21-1955 Referring Provider (PT): Ophelia Charter MD   Encounter Date: 11/12/2019   PT End of Session - 11/12/19 0736    Visit Number 4    Number of Visits 16    Date for PT Re-Evaluation 12/29/19    Authorization Type FOTO.    PT Start Time [phone]    PT Stop Time 0817    PT Time Calculation (min) 41 min    Activity Tolerance Patient tolerated treatment well    Behavior During Therapy [facility] for tasks assessed/performed           Past Medical History:  Diagnosis Date  . Anxiety   . Arthritis    neck- cervical disc herniation   . Asthma    as child  . Back pain   . Cancer ([place])    skin - low back , insitu  melanoma   . Constipation   . COVID-19 09/07/2019   sx fever/cough/chills/lost taste and smell  . Depression   . Diabetes mellitus without complication ([place])   . Dyspnea   . Gallbladder problem   . GERD (gastroesophageal reflux disease)   . H/O cardiovascular stress test 1990   pt. reports that she was having a lot of stress & she was seen by cardiac- had stress test & told that it was negative   . Hyperlipidemia   . Hypertension   . Hypothyroidism   . Leg edema   . Pulmonary embolism ([place]) [place]. at [facility]- on blood thinning med., thought to be related to lack of activity, smoking & BCP    Past Surgical History:  Procedure Laterality Date  . ANTERIOR CERVICAL DECOMP/DISCECTOMY FUSION N/A 08/16/2016   Procedure: Cervical Four-Five Anterior cervical decompression/discectomy/fusion;  Surgeon: Erline Levine, MD;  Location: [place];  Service: Neurosurgery;  Laterality: N/A;  C4-5 Anterior cervical decompression/discectomy/fusion  . BACK SURGERY     sebaceous cyst removed  . CHOLECYSTECTOMY  1941   umbilical hernia ------ followed  cholecystectomy  . Cyst removed from back    . CYSTECTOMY Bilateral    cyst on ovaries  . HERNIA REPAIR     umbilical hernia repair   . MASS EXCISION N/A 11/21/2017   Procedure: EXCISION SEBACEOUS CYST ON BACK;  Surgeon: Aviva Signs, MD;  Location: [facility];  Service: General;  Laterality: N/A;  . SHOULDER ARTHROSCOPY WITH ROTATOR CUFF REPAIR AND SUBACROMIAL DECOMPRESSION Right 09/23/2019   Procedure: RIGHT SHOULDER ARTHROSCOPY DEBRIDEMENT, ACROMIOPLASTY, DISTAL CLAVICAL EXCISION ROTATOR CUFF REPAIR;  Surgeon: Hiram Gash, MD;  Location: [place];  Service: Orthopedics;  Laterality: Right;  . TUBAL LIGATION      There were no vitals filed for this visit.   Subjective Assessment - 11/12/19 0735    Subjective COVID 19 screening performed on patient. Patient reports that she is very sore even with writing or using a mouse.    Pertinent History Hypothyroidism, DM, h/o back pain, hernia repair, SCDF, OA.    Patient Stated Goals Use right arm without difficulty.    Currently in Pain? Yes    Pain Score 5     Pain Location Shoulder    Pain Orientation Right    Pain Descriptors / Indicators Sore    Pain Type Acute pain    Pain Onset  More than a month ago    Pain Frequency Intermittent              [facility] PT Assessment - 11/12/19 0001      Assessment   Medical Diagnosis RT shld DCR, SAD, massive RTC repair.    Referring Provider (PT) Ophelia Charter MD    Onset Date/Surgical Date 09/23/19    Next MD Visit 11/2019      Precautions   Precaution Comments Follow protocol.                         [place] Adult PT Treatment/Exercise - 11/12/19 0001      Modalities   Modalities Vasopneumatic      Vasopneumatic   Number Minutes Vasopneumatic  15 minutes    Vasopnuematic Location  Shoulder    Vasopneumatic Pressure Low    Vasopneumatic Temperature  34/edema and pain      Manual Therapy   Manual Therapy Passive ROM    Passive ROM PROM of R shoulder into  flexion, ER, IR with holds at end range                       PT Long Term Goals - 11/05/19 7035      PT LONG TERM GOAL #1   Title Independent with a HEP.    Time 8    Period Weeks    Status On-going      PT LONG TERM GOAL #2   Title Active right shoulder flexion to 145 degrees so the patient can easily reach overhead.    Time 8    Period Weeks    Status On-going      PT LONG TERM GOAL #3   Title Active ER to 70 degrees+ to allow for easily donning/doffing of apparel.    Time 8    Period Weeks    Status On-going      PT LONG TERM GOAL #4   Title Increase ROM so patient is able to reach behind back to L4.    Time 8    Period Weeks    Status On-going      PT LONG TERM GOAL #5   Title Increase shoulder strength to a solid 4 to 4+/5 to increase stability for performance of functional activities.    Time 8    Period Weeks    Status On-going      PT LONG TERM GOAL #6   Title Perform ADL's with pain not > 3/10.    Time 8    Period Weeks    Status On-going                 Plan - 11/12/19 0093    Clinical Impression Statement Patient presented in clinic with reports of more R shoulder soreness after being on the computer. Patient reported some discomfort at end range flexion upon start of treatment. Firm end feels and smooth arc of motion noted during PROM of R shoulder. Normal vasopneumatic response noted following removal of the modality.    Personal Factors and Comorbidities Comorbidity 1;Comorbidity 2    Comorbidities Hypothyroidism, DM, h/o back pain, hernia repair, SCDF, OA.    Examination-Activity Limitations Reach Overhead;Other    Examination-Participation Restrictions Other    Stability/Clinical Decision Making Stable/Uncomplicated    Rehab Potential Excellent    PT Frequency 2x / week    PT Duration 8 weeks    PT Treatment/Interventions  ADLs/Self Care Home Management;Cryotherapy;Electrical Stimulation;Ultrasound;Moist Heat;Therapeutic  activities;Therapeutic exercise;Patient/family education;Passive range of motion;Vasopneumatic Device    PT Next Visit Plan Begin with gentle right shoulder PROM.  Non-motoric electrical stimulation and vasopneumatic with pillow between elbow and thorax.    Consulted and Agree with Plan of Care Patient           Patient will benefit from skilled therapeutic intervention in order to improve the following deficits and impairments:  Pain, Decreased activity tolerance, Increased edema, Decreased range of motion  Visit Diagnosis: Acute pain of right shoulder  Stiffness of right shoulder, not elsewhere classified     Problem List Patient Active Problem List   Diagnosis Date Noted  . Sebaceous cyst   . Class 2 severe obesity with serious comorbidity and body mass index (BMI) of 35.0 to 35.9 in adult ([place]) 10/27/2017  . Other fatigue 09/10/2017  . Shortness of breath on exertion 09/10/2017  . Type 2 diabetes mellitus without complication, without long-term current use of insulin ([place]) 09/10/2017  . Essential hypertension 09/10/2017  . Other hyperlipidemia 09/10/2017  . Other specified hypothyroidism 09/10/2017  . Herniated cervical disc 08/16/2016    Standley Brooking, PTA 11/12/2019, 8:29 AM  [facility] [place], [place], [address] Phone: [phone]   Fax:  [phone]  Name: Crystal Roberts MRN: [id number] Date of Birth: 09-20-1955

## 2019-11-16 ENCOUNTER — Ambulatory Visit: Payer: 59 | Attending: Orthopaedic Surgery | Admitting: Physical Therapy

## 2019-11-16 ENCOUNTER — Other Ambulatory Visit: Payer: Self-pay

## 2019-11-16 DIAGNOSIS — M25611 Stiffness of right shoulder, not elsewhere classified: Secondary | ICD-10-CM | POA: Insufficient documentation

## 2019-11-16 DIAGNOSIS — M25511 Pain in right shoulder: Secondary | ICD-10-CM | POA: Diagnosis not present

## 2019-11-16 NOTE — Therapy (Signed)
[facility] [place], [place], [address] Phone: [phone]   Fax:  [phone]  Physical Therapy Treatment  Patient Details  Name: Crystal Roberts MRN: [id number] Date of Birth: [birth year] Referring Provider (PT): Ophelia Charter MD   Encounter Date: 11/16/2019   PT End of Session - 11/16/19 1122    Visit Number 5    Number of Visits 16    Date for PT Re-Evaluation 12/29/19    Authorization Type FOTO.    PT Start Time 1118    PT Stop Time 1159    PT Time Calculation (min) 41 min    Activity Tolerance Patient tolerated treatment well    Behavior During Therapy WFL for tasks assessed/performed           Past Medical History:  Diagnosis Date  . Anxiety   . Arthritis    neck- cervical disc herniation   . Asthma    as child  . Back pain   . Cancer ([place])    skin - low back , insitu  melanoma   . Constipation   . COVID-19 09/07/2019   sx fever/cough/chills/lost taste and smell  . Depression   . Diabetes mellitus without complication ([place])   . Dyspnea   . Gallbladder problem   . GERD (gastroesophageal reflux disease)   . H/O cardiovascular stress test 1990   pt. reports that she was having a lot of stress & she was seen by cardiac- had stress test & told that it was negative   . Hyperlipidemia   . Hypertension   . Hypothyroidism   . Leg edema   . Pulmonary embolism ([place]) [place]. at [facility]- on blood thinning med., thought to be related to lack of activity, smoking & BCP    Past Surgical History:  Procedure Laterality Date  . ANTERIOR CERVICAL DECOMP/DISCECTOMY FUSION N/A 08/16/2016   Procedure: Cervical Four-Five Anterior cervical decompression/discectomy/fusion;  Surgeon: Erline Levine, MD;  Location: [place];  Service: Neurosurgery;  Laterality: N/A;  C4-5 Anterior cervical decompression/discectomy/fusion  . BACK SURGERY     sebaceous cyst removed  . CHOLECYSTECTOMY  7673   umbilical hernia ------ followed  cholecystectomy  . Cyst removed from back    . CYSTECTOMY Bilateral    cyst on ovaries  . HERNIA REPAIR     umbilical hernia repair   . MASS EXCISION N/A 11/21/2017   Procedure: EXCISION SEBACEOUS CYST ON BACK;  Surgeon: Aviva Signs, MD;  Location: [facility];  Service: General;  Laterality: N/A;  . SHOULDER ARTHROSCOPY WITH ROTATOR CUFF REPAIR AND SUBACROMIAL DECOMPRESSION Right 09/23/2019   Procedure: RIGHT SHOULDER ARTHROSCOPY DEBRIDEMENT, ACROMIOPLASTY, DISTAL CLAVICAL EXCISION ROTATOR CUFF REPAIR;  Surgeon: Hiram Gash, MD;  Location: [place];  Service: Orthopedics;  Laterality: Right;  . TUBAL LIGATION      There were no vitals filed for this visit.   Subjective Assessment - 11/16/19 1121    Subjective COVID 19 screening performed on patient. Reports that her MD is going to get her back to work after she goes back to him in a few weeks.    Pertinent History Hypothyroidism, DM, h/o back pain, hernia repair, SCDF, OA.    Patient Stated Goals Use right arm without difficulty.    Currently in Pain? Yes    Pain Score 2     Pain Location Shoulder    Pain Orientation Right    Pain Descriptors / Indicators Discomfort    Pain  Type Acute pain    Pain Onset More than a month ago    Pain Frequency Intermittent              [facility] PT Assessment - 11/16/19 0001      Assessment   Medical Diagnosis RT shld DCR, SAD, massive RTC repair.    Referring Provider (PT) Ophelia Charter MD    Onset Date/Surgical Date 09/23/19    Next MD Visit 11/2019      Precautions   Precaution Comments Follow protocol.                         [place] Adult PT Treatment/Exercise - 11/16/19 0001      Exercises   Exercises Shoulder      Shoulder Exercises: Pulleys   Flexion 5 minutes      Shoulder Exercises: ROM/Strengthening   [place] in sitting; flex/ext, circles x4 min      Modalities   Modalities Vasopneumatic      Vasopneumatic   Number Minutes Vasopneumatic  10  minutes    Vasopnuematic Location  Shoulder    Vasopneumatic Pressure Low    Vasopneumatic Temperature  34/edema and pain      Manual Therapy   Manual Therapy Passive ROM    Passive ROM PROM of R shoulder into flexion, ER, IR with holds at end range                       PT Long Term Goals - 11/05/19 8299      PT LONG TERM GOAL #1   Title Independent with a HEP.    Time 8    Period Weeks    Status On-going      PT LONG TERM GOAL #2   Title Active right shoulder flexion to 145 degrees so the patient can easily reach overhead.    Time 8    Period Weeks    Status On-going      PT LONG TERM GOAL #3   Title Active ER to 70 degrees+ to allow for easily donning/doffing of apparel.    Time 8    Period Weeks    Status On-going      PT LONG TERM GOAL #4   Title Increase ROM so patient is able to reach behind back to L4.    Time 8    Period Weeks    Status On-going      PT LONG TERM GOAL #5   Title Increase shoulder strength to a solid 4 to 4+/5 to increase stability for performance of functional activities.    Time 8    Period Weeks    Status On-going      PT LONG TERM GOAL #6   Title Perform ADL's with pain not > 3/10.    Time 8    Period Weeks    Status On-going                 Plan - 11/16/19 1211    Clinical Impression Statement Patient was progressed per protocol but limited by R anterior shoulder discomfort and more R wrist discomfort as AAROM continued. Firm end feels and smooth arc of motion noted during PROM of R shoulder. Normal vasopneumatic response noted following removal of the modality.    Personal Factors and Comorbidities Comorbidity 1;Comorbidity 2    Comorbidities Hypothyroidism, DM, h/o back pain, hernia repair, SCDF, OA.    Examination-Activity Limitations Reach  Overhead;Other    Examination-Participation Restrictions Other    Stability/Clinical Decision Making Stable/Uncomplicated    Rehab Potential Excellent    PT Frequency  2x / week    PT Duration 8 weeks    PT Treatment/Interventions ADLs/Self Care Home Management;Cryotherapy;Electrical Stimulation;Ultrasound;Moist Heat;Therapeutic activities;Therapeutic exercise;Patient/family education;Passive range of motion;Vasopneumatic Device    PT Next Visit Plan Begin with gentle right shoulder PROM.  Non-motoric electrical stimulation and vasopneumatic with pillow between elbow and thorax.    Consulted and Agree with Plan of Care Patient           Patient will benefit from skilled therapeutic intervention in order to improve the following deficits and impairments:  Pain, Decreased activity tolerance, Increased edema, Decreased range of motion  Visit Diagnosis: Acute pain of right shoulder  Stiffness of right shoulder, not elsewhere classified     Problem List Patient Active Problem List   Diagnosis Date Noted  . Sebaceous cyst   . Class 2 severe obesity with serious comorbidity and body mass index (BMI) of 35.0 to 35.9 in adult ([place]) 10/27/2017  . Other fatigue 09/10/2017  . Shortness of breath on exertion 09/10/2017  . Type 2 diabetes mellitus without complication, without long-term current use of insulin ([place]) 09/10/2017  . Essential hypertension 09/10/2017  . Other hyperlipidemia 09/10/2017  . Other specified hypothyroidism 09/10/2017  . Herniated cervical disc 08/16/2016    Standley Brooking, PTA 11/16/2019, 12:15 PM  [facility] [place], [place], [address] Phone: [phone]   Fax:  [phone]  Name: MARANDA MARTE MRN: [id number] Date of Birth: 07-01-1955

## 2019-11-19 ENCOUNTER — Other Ambulatory Visit: Payer: Self-pay

## 2019-11-19 ENCOUNTER — Ambulatory Visit: Payer: 59 | Admitting: Physical Therapy

## 2019-11-19 ENCOUNTER — Encounter: Payer: Self-pay | Admitting: Physical Therapy

## 2019-11-19 DIAGNOSIS — M25611 Stiffness of right shoulder, not elsewhere classified: Secondary | ICD-10-CM

## 2019-11-19 DIAGNOSIS — M25511 Pain in right shoulder: Secondary | ICD-10-CM | POA: Diagnosis not present

## 2019-11-19 NOTE — Therapy (Signed)
[facility] [place], [place], [address] Phone: [phone]   Fax:  [phone]  Physical Therapy Treatment  Patient Details  Name: Crystal Roberts MRN: [id number] Date of Birth: 1955-07-15 Referring Provider (PT): Ophelia Charter MD   Encounter Date: 11/19/2019   PT End of Session - 11/19/19 0746    Visit Number 6    Number of Visits 16    Date for PT Re-Evaluation 12/29/19    Authorization Type FOTO.    PT Start Time [phone]    PT Stop Time 0820    PT Time Calculation (min) 36 min    Activity Tolerance Patient tolerated treatment well    Behavior During Therapy WFL for tasks assessed/performed           Past Medical History:  Diagnosis Date  . Anxiety   . Arthritis    neck- cervical disc herniation   . Asthma    as child  . Back pain   . Cancer ([place])    skin - low back , insitu  melanoma   . Constipation   . COVID-19 09/07/2019   sx fever/cough/chills/lost taste and smell  . Depression   . Diabetes mellitus without complication ([place])   . Dyspnea   . Gallbladder problem   . GERD (gastroesophageal reflux disease)   . H/O cardiovascular stress test 1990   pt. reports that she was having a lot of stress & she was seen by cardiac- had stress test & told that it was negative   . Hyperlipidemia   . Hypertension   . Hypothyroidism   . Leg edema   . Pulmonary embolism ([place]) [place]. at [facility]- on blood thinning med., thought to be related to lack of activity, smoking & BCP    Past Surgical History:  Procedure Laterality Date  . ANTERIOR CERVICAL DECOMP/DISCECTOMY FUSION N/A 08/16/2016   Procedure: Cervical Four-Five Anterior cervical decompression/discectomy/fusion;  Surgeon: Erline Levine, MD;  Location: [place];  Service: Neurosurgery;  Laterality: N/A;  C4-5 Anterior cervical decompression/discectomy/fusion  . BACK SURGERY     sebaceous cyst removed  . CHOLECYSTECTOMY  1025   umbilical hernia ------ followed  cholecystectomy  . Cyst removed from back    . CYSTECTOMY Bilateral    cyst on ovaries  . HERNIA REPAIR     umbilical hernia repair   . MASS EXCISION N/A 11/21/2017   Procedure: EXCISION SEBACEOUS CYST ON BACK;  Surgeon: Aviva Signs, MD;  [facility];  Service: General;  Laterality: N/A;  . SHOULDER ARTHROSCOPY WITH ROTATOR CUFF REPAIR AND SUBACROMIAL DECOMPRESSION Right 09/23/2019   Procedure: RIGHT SHOULDER ARTHROSCOPY DEBRIDEMENT, ACROMIOPLASTY, DISTAL CLAVICAL EXCISION ROTATOR CUFF REPAIR;  Surgeon: Hiram Gash, MD;  Location: [place];  Service: Orthopedics;  Laterality: Right;  . TUBAL LIGATION      There were no vitals filed for this visit.   Subjective Assessment - 11/19/19 0746    Subjective COVID 19 screening performed on patient upon arrival. Patient reports a little pain upon arrival.    Pertinent History Hypothyroidism, DM, h/o back pain, hernia repair, SCDF, OA.    Patient Stated Goals Use right arm without difficulty.    Currently in Pain? Yes    Pain Score 1     Pain Location Shoulder    Pain Orientation Right    Pain Descriptors / Indicators Discomfort    Pain Type Acute pain    Pain Onset More than a month ago  Pain Frequency Intermittent              [facility] PT Assessment - 11/19/19 0001      Assessment   Medical Diagnosis RT shld DCR, SAD, massive RTC repair.    Referring Provider (PT) Ophelia Charter MD    Onset Date/Surgical Date 09/23/19    Next MD Visit 11/2019      Precautions   Precaution Comments Follow protocol.                         [facility] Adult PT Treatment/Exercise - 11/19/19 0001      Shoulder Exercises: Supine   Protraction AAROM;Both;20 reps    External Rotation AAROM;Right;20 reps    Flexion AAROM;Both;20 reps      Shoulder Exercises: Pulleys   Flexion 5 minutes      Shoulder Exercises: Therapy Ball   Flexion Right;20 reps    Flexion Limitations on table    Other Therapy Ball Exercises CW  circles on table x20 reps      Modalities   Modalities Vasopneumatic      Vasopneumatic   Number Minutes Vasopneumatic  10 minutes    Vasopnuematic Location  Shoulder    Vasopneumatic Pressure Low    Vasopneumatic Temperature  34/edema and pain      Manual Therapy   Manual Therapy Passive ROM    Passive ROM PROM of R shoulder into flexion, ER, IR with holds at end range                       PT Long Term Goals - 11/05/19 1610      PT LONG TERM GOAL #1   Title Independent with a HEP.    Time 8    Period Weeks    Status On-going      PT LONG TERM GOAL #2   Title Active right shoulder flexion to 145 degrees so the patient can easily reach overhead.    Time 8    Period Weeks    Status On-going      PT LONG TERM GOAL #3   Title Active ER to 70 degrees+ to allow for easily donning/doffing of apparel.    Time 8    Period Weeks    Status On-going      PT LONG TERM GOAL #4   Title Increase ROM so patient is able to reach behind back to L4.    Time 8    Period Weeks    Status On-going      PT LONG TERM GOAL #5   Title Increase shoulder strength to a solid 4 to 4+/5 to increase stability for performance of functional activities.    Time 8    Period Weeks    Status On-going      PT LONG TERM GOAL #6   Title Perform ADL's with pain not > 3/10.    Time 8    Period Weeks    Status On-going                 Plan - 11/19/19 9604    Clinical Impression Statement Patient presented in clinic with reports of minimal R shoulder pain during treatment. Patient indicated more anterior shoulder discomfort and soreness of R pectoralis. Patient guided through [facility] exercises for ROM with no complaints of pain. Firm end feels and smooth arc of motion noted during PROM of R shoulder. Normal vasopneumatic response noted following  removal of the modality    Personal Factors and Comorbidities Comorbidity 1;Comorbidity 2    Comorbidities Hypothyroidism, DM, h/o back pain,  hernia repair, SCDF, OA.    Examination-Activity Limitations Reach Overhead;Other    Examination-Participation Restrictions Other    Stability/Clinical Decision Making Stable/Uncomplicated    Rehab Potential Excellent    PT Frequency 2x / week    PT Duration 8 weeks    PT Treatment/Interventions ADLs/Self Care Home Management;Cryotherapy;Electrical Stimulation;Ultrasound;Moist Heat;Therapeutic activities;Therapeutic exercise;Patient/family education;Passive range of motion;Vasopneumatic Device    PT Next Visit Plan Begin with gentle right shoulder PROM.  Non-motoric electrical stimulation and vasopneumatic with pillow between elbow and thorax.    Consulted and Agree with Plan of Care Patient           Patient will benefit from skilled therapeutic intervention in order to improve the following deficits and impairments:  Pain, Decreased activity tolerance, Increased edema, Decreased range of motion  Visit Diagnosis: Acute pain of right shoulder  Stiffness of right shoulder, not elsewhere classified     Problem List Patient Active Problem List   Diagnosis Date Noted  . Sebaceous cyst   . Class 2 severe obesity with serious comorbidity and body mass index (BMI) of 35.0 to 35.9 in adult ([place]) 10/27/2017  . Other fatigue 09/10/2017  . Shortness of breath on exertion 09/10/2017  . Type 2 diabetes mellitus without complication, without long-term current use of insulin ([place]) 09/10/2017  . Essential hypertension 09/10/2017  . Other hyperlipidemia 09/10/2017  . Other specified hypothyroidism 09/10/2017  . Herniated cervical disc 08/16/2016    Standley Brooking, PTA 11/19/2019, 8:23 AM  [facility] [address] [place], [place], [address] Phone: [phone]   Fax:  [phone]  Name: Crystal Roberts MRN: [id number] Date of Birth: 07/03/1955

## 2019-11-22 ENCOUNTER — Other Ambulatory Visit (HOSPITAL_COMMUNITY): Payer: Self-pay | Admitting: Family Medicine

## 2019-11-22 MED FILL — METFORMIN HCL 500 MG TABS: 500 | 90 days supply | Qty: 90 | Fill #0

## 2019-11-24 ENCOUNTER — Encounter: Payer: Self-pay | Admitting: Physical Therapy

## 2019-11-24 ENCOUNTER — Ambulatory Visit: Payer: 59 | Admitting: Physical Therapy

## 2019-11-24 ENCOUNTER — Other Ambulatory Visit: Payer: Self-pay

## 2019-11-24 DIAGNOSIS — M25511 Pain in right shoulder: Secondary | ICD-10-CM | POA: Diagnosis not present

## 2019-11-24 DIAGNOSIS — M25611 Stiffness of right shoulder, not elsewhere classified: Secondary | ICD-10-CM | POA: Diagnosis not present

## 2019-11-24 NOTE — Therapy (Signed)
[facility] [place], [place], [address] Phone: [phone]   Fax:  [phone]  Physical Therapy Treatment  Patient Details  Name: Crystal Roberts MRN: [id number] Date of Birth: Mar 06, 1955 Referring Provider (PT): Ophelia Charter MD   Encounter Date: 11/24/2019   PT End of Session - 11/24/19 0828    Visit Number 7    Number of Visits 16    Date for PT Re-Evaluation 12/29/19    Authorization Type FOTO.    PT Start Time 0756    PT Stop Time 0829    PT Time Calculation (min) 33 min    Activity Tolerance Patient tolerated treatment well    Behavior During Therapy [facility] for tasks assessed/performed           Past Medical History:  Diagnosis Date  . Anxiety   . Arthritis    neck- cervical disc herniation   . Asthma    as child  . Back pain   . Cancer ([place])    skin - low back , insitu  melanoma   . Constipation   . COVID-19 09/07/2019   sx fever/cough/chills/lost taste and smell  . Depression   . Diabetes mellitus without complication ([place])   . Dyspnea   . Gallbladder problem   . GERD (gastroesophageal reflux disease)   . H/O cardiovascular stress test 1990   pt. reports that she was having a lot of stress & she was seen by cardiac- had stress test & told that it was negative   . Hyperlipidemia   . Hypertension   . Hypothyroidism   . Leg edema   . Pulmonary embolism ([place]) [place]. at [facility]- on blood thinning med., thought to be related to lack of activity, smoking & BCP    Past Surgical History:  Procedure Laterality Date  . ANTERIOR CERVICAL DECOMP/DISCECTOMY FUSION N/A 08/16/2016   Procedure: Cervical Four-Five Anterior cervical decompression/discectomy/fusion;  Surgeon: Erline Levine, MD;  Location: [place];  Service: Neurosurgery;  Laterality: N/A;  C4-5 Anterior cervical decompression/discectomy/fusion  . BACK SURGERY     sebaceous cyst removed  . CHOLECYSTECTOMY  6962   umbilical hernia ------ followed  cholecystectomy  . Cyst removed from back    . CYSTECTOMY Bilateral    cyst on ovaries  . HERNIA REPAIR     umbilical hernia repair   . MASS EXCISION N/A 11/21/2017   Procedure: EXCISION SEBACEOUS CYST ON BACK;  Surgeon: Aviva Signs, MD;  Location: [facility];  Service: General;  Laterality: N/A;  . SHOULDER ARTHROSCOPY WITH ROTATOR CUFF REPAIR AND SUBACROMIAL DECOMPRESSION Right 09/23/2019   Procedure: RIGHT SHOULDER ARTHROSCOPY DEBRIDEMENT, ACROMIOPLASTY, DISTAL CLAVICAL EXCISION ROTATOR CUFF REPAIR;  Surgeon: Hiram Gash, MD;  Location: [place];  Service: Orthopedics;  Laterality: Right;  . TUBAL LIGATION      There were no vitals filed for this visit.   Subjective Assessment - 11/24/19 0826    Subjective COVID 19 screening performed on patient upon arrival. Patient reports she was very sore after last treatment and trying new stuff.    Pertinent History Hypothyroidism, DM, h/o back pain, hernia repair, SCDF, OA.    Patient Stated Goals Use right arm without difficulty.    Currently in Pain? Yes    Pain Score 3     Pain Location Shoulder    Pain Orientation Right    Pain Descriptors / Indicators Discomfort;Sore    Pain Type Acute pain    Pain  Onset More than a month ago    Pain Frequency Intermittent    Aggravating Factors  Movement              [facility] PT Assessment - 11/24/19 0001      Assessment   Medical Diagnosis RT shld DCR, SAD, massive RTC repair.    Referring Provider (PT) Ophelia Charter MD    Onset Date/Surgical Date 09/23/19    Next MD Visit 12/07/2019      Precautions   Precaution Comments Follow protocol.                         [facility] Adult PT Treatment/Exercise - 11/24/19 0001      Modalities   Modalities Vasopneumatic      Vasopneumatic   Number Minutes Vasopneumatic  10 minutes    Vasopnuematic Location  Shoulder    Vasopneumatic Pressure Low    Vasopneumatic Temperature  34/edema and pain      Manual Therapy    Manual Therapy Passive ROM    Passive ROM PROM of R shoulder into flexion, ER, IR with holds at end range                       PT Long Term Goals - 11/05/19 2671      PT LONG TERM GOAL #1   Title Independent with a HEP.    Time 8    Period Weeks    Status On-going      PT LONG TERM GOAL #2   Title Active right shoulder flexion to 145 degrees so the patient can easily reach overhead.    Time 8    Period Weeks    Status On-going      PT LONG TERM GOAL #3   Title Active ER to 70 degrees+ to allow for easily donning/doffing of apparel.    Time 8    Period Weeks    Status On-going      PT LONG TERM GOAL #4   Title Increase ROM so patient is able to reach behind back to L4.    Time 8    Period Weeks    Status On-going      PT LONG TERM GOAL #5   Title Increase shoulder strength to a solid 4 to 4+/5 to increase stability for performance of functional activities.    Time 8    Period Weeks    Status On-going      PT LONG TERM GOAL #6   Title Perform ADL's with pain not > 3/10.    Time 8    Period Weeks    Status On-going                 Plan - 11/24/19 0829    Clinical Impression Statement Patient arrived in clinic with increased soreness but also reported pain after progressed per protocol last treatment. Patient also reported being able to sleep halfway on R shoulder the other night as well. Minimal facial grimacing noted as PROM started in end range flexion. Firm end feels and smooth arc of motion noted during PROM of R shoulder in all directions. Normal vasopneumatic response noted following removal of the modality.    Personal Factors and Comorbidities Comorbidity 1;Comorbidity 2    Comorbidities Hypothyroidism, DM, h/o back pain, hernia repair, SCDF, OA.    Examination-Activity Limitations Reach Overhead;Other    Examination-Participation Restrictions Other    Stability/Clinical Decision Making  Stable/Uncomplicated    Rehab Potential Excellent     PT Frequency 2x / week    PT Duration 8 weeks    PT Treatment/Interventions ADLs/Self Care Home Management;Cryotherapy;Electrical Stimulation;Ultrasound;Moist Heat;Therapeutic activities;Therapeutic exercise;Patient/family education;Passive range of motion;Vasopneumatic Device    PT Next Visit Plan Begin with gentle right shoulder PROM.  Non-motoric electrical stimulation and vasopneumatic with pillow between elbow and thorax.    Consulted and Agree with Plan of Care Patient           Patient will benefit from skilled therapeutic intervention in order to improve the following deficits and impairments:  Pain, Decreased activity tolerance, Increased edema, Decreased range of motion  Visit Diagnosis: Acute pain of right shoulder  Stiffness of right shoulder, not elsewhere classified     Problem List Patient Active Problem List   Diagnosis Date Noted  . Sebaceous cyst   . Class 2 severe obesity with serious comorbidity and body mass index (BMI) of 35.0 to 35.9 in adult ([place]) 10/27/2017  . Other fatigue 09/10/2017  . Shortness of breath on exertion 09/10/2017  . Type 2 diabetes mellitus without complication, without long-term current use of insulin ([place]) 09/10/2017  . Essential hypertension 09/10/2017  . Other hyperlipidemia 09/10/2017  . Other specified hypothyroidism 09/10/2017  . Herniated cervical disc 08/16/2016    Standley Brooking, PTA 11/24/2019, 8:36 AM  [facility] [place], [place], [address] Phone: [phone]   Fax:  [phone]  Name: ANGELYSE HESLIN MRN: [id number] Date of Birth: 09-Jul-1955

## 2019-11-25 ENCOUNTER — Ambulatory Visit (INDEPENDENT_AMBULATORY_CARE_PROVIDER_SITE_OTHER): Payer: 59 | Admitting: Bariatrics

## 2019-11-26 ENCOUNTER — Other Ambulatory Visit: Payer: Self-pay

## 2019-11-26 ENCOUNTER — Ambulatory Visit: Payer: 59 | Admitting: Physical Therapy

## 2019-11-26 ENCOUNTER — Encounter: Payer: Self-pay | Admitting: Physical Therapy

## 2019-11-26 DIAGNOSIS — M25611 Stiffness of right shoulder, not elsewhere classified: Secondary | ICD-10-CM

## 2019-11-26 DIAGNOSIS — M25511 Pain in right shoulder: Secondary | ICD-10-CM | POA: Diagnosis not present

## 2019-11-26 NOTE — Therapy (Signed)
[facility] [place], [place], [address] Phone: [phone]   Fax:  [phone]  Physical Therapy Treatment  Patient Details  Name: Crystal Roberts MRN: [id number] Date of Birth: Mar 15, 1955 Referring Provider (PT): Ophelia Charter MD   Encounter Date: 11/26/2019   PT End of Session - 11/26/19 0751    Visit Number 8    Number of Visits 16    Date for PT Re-Evaluation 12/29/19    Authorization Type FOTO.    PT Start Time [phone]    PT Stop Time 0826    PT Time Calculation (min) 38 min    Activity Tolerance Patient tolerated treatment well    Behavior During Therapy [facility] for tasks assessed/performed           Past Medical History:  Diagnosis Date  . Anxiety   . Arthritis    neck- cervical disc herniation   . Asthma    as child  . Back pain   . Cancer ([place])    skin - low back , insitu  melanoma   . Constipation   . COVID-19 09/07/2019   sx fever/cough/chills/lost taste and smell  . Depression   . Diabetes mellitus without complication ([place])   . Dyspnea   . Gallbladder problem   . GERD (gastroesophageal reflux disease)   . H/O cardiovascular stress test 1990   pt. reports that she was having a lot of stress & she was seen by cardiac- had stress test & told that it was negative   . Hyperlipidemia   . Hypertension   . Hypothyroidism   . Leg edema   . Pulmonary embolism ([place]) [place]. at [facility]- on blood thinning med., thought to be related to lack of activity, smoking & BCP    Past Surgical History:  Procedure Laterality Date  . ANTERIOR CERVICAL DECOMP/DISCECTOMY FUSION N/A 08/16/2016   Procedure: Cervical Four-Five Anterior cervical decompression/discectomy/fusion;  Surgeon: Erline Levine, MD;  Location: [place];  Service: Neurosurgery;  Laterality: N/A;  C4-5 Anterior cervical decompression/discectomy/fusion  . BACK SURGERY     sebaceous cyst removed  . CHOLECYSTECTOMY  8127   umbilical hernia ------ followed  cholecystectomy  . Cyst removed from back    . CYSTECTOMY Bilateral    cyst on ovaries  . HERNIA REPAIR     umbilical hernia repair   . MASS EXCISION N/A 11/21/2017   Procedure: EXCISION SEBACEOUS CYST ON BACK;  Surgeon: Aviva Signs, MD;  Location: [facility];  Service: General;  Laterality: N/A;  . SHOULDER ARTHROSCOPY WITH ROTATOR CUFF REPAIR AND SUBACROMIAL DECOMPRESSION Right 09/23/2019   Procedure: RIGHT SHOULDER ARTHROSCOPY DEBRIDEMENT, ACROMIOPLASTY, DISTAL CLAVICAL EXCISION ROTATOR CUFF REPAIR;  Surgeon: Hiram Gash, MD;  Location: [place];  Service: Orthopedics;  Laterality: Right;  . TUBAL LIGATION      There were no vitals filed for this visit.   Subjective Assessment - 11/26/19 0750    Subjective COVID 19 screening performed on patient upon arrival. Patient reports minimal soreness upon arrival.    Pertinent History Hypothyroidism, DM, h/o back pain, hernia repair, SCDF, OA.    Patient Stated Goals Use right arm without difficulty.    Currently in Pain? Yes    Pain Score 2     Pain Location Shoulder    Pain Orientation Right    Pain Descriptors / Indicators Sore    Pain Type Acute pain    Pain Onset More than a month ago  Pain Frequency Intermittent              [facility] PT Assessment - 11/26/19 0001      Assessment   Medical Diagnosis RT shld DCR, SAD, massive RTC repair.    Referring Provider (PT) Ophelia Charter MD    Onset Date/Surgical Date 09/23/19    Next MD Visit 12/07/2019      Precautions   Precaution Comments Follow protocol.                         [facility] Adult PT Treatment/Exercise - 11/26/19 0001      Shoulder Exercises: Supine   Protraction AAROM;Both;20 reps    External Rotation AAROM;Right;20 reps    Flexion AAROM;Both;20 reps      Shoulder Exercises: Pulleys   Flexion 5 minutes      Shoulder Exercises: ROM/Strengthening   Ball on Wall on table flexion x10 reps, CW circles x20 reps      Modalities    Modalities Vasopneumatic      Vasopneumatic   Number Minutes Vasopneumatic  10 minutes    Vasopnuematic Location  Shoulder    Vasopneumatic Pressure Low    Vasopneumatic Temperature  34/edema and pain      Manual Therapy   Manual Therapy Passive ROM    Passive ROM PROM of R shoulder into flexion, ER, IR with holds at end range                       PT Long Term Goals - 11/05/19 8756      PT LONG TERM GOAL #1   Title Independent with a HEP.    Time 8    Period Weeks    Status On-going      PT LONG TERM GOAL #2   Title Active right shoulder flexion to 145 degrees so the patient can easily reach overhead.    Time 8    Period Weeks    Status On-going      PT LONG TERM GOAL #3   Title Active ER to 70 degrees+ to allow for easily donning/doffing of apparel.    Time 8    Period Weeks    Status On-going      PT LONG TERM GOAL #4   Title Increase ROM so patient is able to reach behind back to L4.    Time 8    Period Weeks    Status On-going      PT LONG TERM GOAL #5   Title Increase shoulder strength to a solid 4 to 4+/5 to increase stability for performance of functional activities.    Time 8    Period Weeks    Status On-going      PT LONG TERM GOAL #6   Title Perform ADL's with pain not > 3/10.    Time 8    Period Weeks    Status On-going                 Plan - 11/26/19 0818    Clinical Impression Statement Patient presented in clinic with reports of minimal soreness upon arrival. Patient limited with some AAROM exercises for shoulder due to R wrist injury and brace donned. Patient reports that donning bra and reaching behind her back is limiting at this time. Intermittant end range facial grimacing noted with AAROM flexion in supine. VCs for pain free range provided for patient during AAROM exercises. Firm end feels  and smooth arc of motion noted during PROM of R shoulder in all directions. Normal vasopneumatic response noted following removal of  the modality.    Personal Factors and Comorbidities Comorbidity 1;Comorbidity 2    Comorbidities Hypothyroidism, DM, h/o back pain, hernia repair, SCDF, OA.    Examination-Activity Limitations Reach Overhead;Other    Examination-Participation Restrictions Other    Stability/Clinical Decision Making Stable/Uncomplicated    Rehab Potential Excellent    PT Frequency 2x / week    PT Duration 8 weeks    PT Treatment/Interventions ADLs/Self Care Home Management;Cryotherapy;Electrical Stimulation;Ultrasound;Moist Heat;Therapeutic activities;Therapeutic exercise;Patient/family education;Passive range of motion;Vasopneumatic Device    PT Next Visit Plan Begin with gentle right shoulder PROM.  Non-motoric electrical stimulation and vasopneumatic with pillow between elbow and thorax.    Consulted and Agree with Plan of Care Patient           Patient will benefit from skilled therapeutic intervention in order to improve the following deficits and impairments:  Pain, Decreased activity tolerance, Increased edema, Decreased range of motion  Visit Diagnosis: Acute pain of right shoulder  Stiffness of right shoulder, not elsewhere classified     Problem List Patient Active Problem List   Diagnosis Date Noted  . Sebaceous cyst   . Class 2 severe obesity with serious comorbidity and body mass index (BMI) of 35.0 to 35.9 in adult ([place]) 10/27/2017  . Other fatigue 09/10/2017  . Shortness of breath on exertion 09/10/2017  . Type 2 diabetes mellitus without complication, without long-term current use of insulin ([place]) 09/10/2017  . Essential hypertension 09/10/2017  . Other hyperlipidemia 09/10/2017  . Other specified hypothyroidism 09/10/2017  . Herniated cervical disc 08/16/2016    Standley Brooking, PTA 11/26/2019, 8:30 AM  [facility] [place], [place], [address] Phone: [phone]   Fax:  [phone]  Name: Crystal Roberts MRN:  [id number] Date of Birth: [birth year]

## 2019-11-29 MED FILL — MELOXICAM 15 MG TABLET: 15 | 30 days supply | Qty: 30 | Fill #0

## 2019-11-29 MED FILL — buPROPion HCL ER (XL) 300 M: 300 | 90 days supply | Qty: 90 | Fill #0

## 2019-11-30 ENCOUNTER — Other Ambulatory Visit: Payer: Self-pay

## 2019-11-30 ENCOUNTER — Encounter: Payer: Self-pay | Admitting: Physical Therapy

## 2019-11-30 ENCOUNTER — Ambulatory Visit: Payer: 59 | Admitting: Physical Therapy

## 2019-11-30 DIAGNOSIS — M25611 Stiffness of right shoulder, not elsewhere classified: Secondary | ICD-10-CM | POA: Diagnosis not present

## 2019-11-30 DIAGNOSIS — M25511 Pain in right shoulder: Secondary | ICD-10-CM | POA: Diagnosis not present

## 2019-11-30 NOTE — Therapy (Signed)
[facility] [place], [place], [address] Phone: [phone]   Fax:  [phone]  Physical Therapy Treatment  Patient Details  Name: Crystal Roberts MRN: [id number] Date of Birth: 1955/02/01 Referring Provider (PT): Ophelia Charter MD   Encounter Date: 11/30/2019   PT End of Session - 11/30/19 0759    Visit Number 9    Number of Visits 16    Date for PT Re-Evaluation 12/29/19    Authorization Type FOTO.    PT Start Time [phone]    PT Stop Time 0825    PT Time Calculation (min) 44 min    Activity Tolerance Patient tolerated treatment well    Behavior During Therapy [facility] for tasks assessed/performed           Past Medical History:  Diagnosis Date  . Anxiety   . Arthritis    neck- cervical disc herniation   . Asthma    as child  . Back pain   . Cancer ([place])    skin - low back , insitu  melanoma   . Constipation   . COVID-19 09/07/2019   sx fever/cough/chills/lost taste and smell  . Depression   . Diabetes mellitus without complication ([place])   . Dyspnea   . Gallbladder problem   . GERD (gastroesophageal reflux disease)   . H/O cardiovascular stress test 1990   pt. reports that she was having a lot of stress & she was seen by cardiac- had stress test & told that it was negative   . Hyperlipidemia   . Hypertension   . Hypothyroidism   . Leg edema   . Pulmonary embolism ([place]) [place]. at [facility]- on blood thinning med., thought to be related to lack of activity, smoking & BCP    Past Surgical History:  Procedure Laterality Date  . ANTERIOR CERVICAL DECOMP/DISCECTOMY FUSION N/A 08/16/2016   Procedure: Cervical Four-Five Anterior cervical decompression/discectomy/fusion;  Surgeon: Erline Levine, MD;  Location: [place];  Service: Neurosurgery;  Laterality: N/A;  C4-5 Anterior cervical decompression/discectomy/fusion  . BACK SURGERY     sebaceous cyst removed  . CHOLECYSTECTOMY  5681   umbilical hernia ------ followed  cholecystectomy  . Cyst removed from back    . CYSTECTOMY Bilateral    cyst on ovaries  . HERNIA REPAIR     umbilical hernia repair   . MASS EXCISION N/A 11/21/2017   Procedure: EXCISION SEBACEOUS CYST ON BACK;  Surgeon: Aviva Signs, MD;  [facility];  Service: General;  Laterality: N/A;  . SHOULDER ARTHROSCOPY WITH ROTATOR CUFF REPAIR AND SUBACROMIAL DECOMPRESSION Right 09/23/2019   Procedure: RIGHT SHOULDER ARTHROSCOPY DEBRIDEMENT, ACROMIOPLASTY, DISTAL CLAVICAL EXCISION ROTATOR CUFF REPAIR;  Surgeon: Hiram Gash, MD;  Location: [place];  Service: Orthopedics;  Laterality: Right;  . TUBAL LIGATION      There were no vitals filed for this visit.   Subjective Assessment - 11/30/19 0742    Subjective COVID 19 screening performed on patient upon arrival. Reports that her shoulder is not as sore today but did a lot of housework yesterday.    Pertinent History Hypothyroidism, DM, h/o back pain, hernia repair, SCDF, OA.    Patient Stated Goals Use right arm without difficulty.    Currently in Pain? Yes    Pain Score 1     Pain Location Shoulder    Pain Orientation Right    Pain Descriptors / Indicators Sore    Pain Type Acute pain  Pain Onset More than a month ago    Pain Frequency Intermittent              [facility] PT Assessment - 11/30/19 0001      Assessment   Medical Diagnosis RT shld DCR, SAD, massive RTC repair.    Referring Provider (PT) Ophelia Charter MD    Onset Date/Surgical Date 09/23/19    Next MD Visit 12/07/2019      Precautions   Precaution Comments Follow protocol.                         [facility] Adult PT Treatment/Exercise - 11/30/19 0001      Shoulder Exercises: Supine   External Rotation AAROM;Right;20 reps    Flexion AAROM;Both;20 reps      Shoulder Exercises: Pulleys   Flexion 5 minutes      Shoulder Exercises: ROM/Strengthening   Wall Wash into flex, CW circles x20 reps each      Shoulder Exercises: Isometric  Strengthening   Flexion 5X5"    Extension 5X5"    External Rotation 5X5"    Internal Rotation 5X5"      Modalities   Modalities Vasopneumatic      Vasopneumatic   Number Minutes Vasopneumatic  10 minutes    Vasopnuematic Location  Shoulder    Vasopneumatic Pressure Low    Vasopneumatic Temperature  34/edema and pain      Manual Therapy   Manual Therapy Passive ROM    Passive ROM PROM of R shoulder into flexion, ER, IR with holds at end range                       PT Long Term Goals - 11/05/19 9562      PT LONG TERM GOAL #1   Title Independent with a HEP.    Time 8    Period Weeks    Status On-going      PT LONG TERM GOAL #2   Title Active right shoulder flexion to 145 degrees so the patient can easily reach overhead.    Time 8    Period Weeks    Status On-going      PT LONG TERM GOAL #3   Title Active ER to 70 degrees+ to allow for easily donning/doffing of apparel.    Time 8    Period Weeks    Status On-going      PT LONG TERM GOAL #4   Title Increase ROM so patient is able to reach behind back to L4.    Time 8    Period Weeks    Status On-going      PT LONG TERM GOAL #5   Title Increase shoulder strength to a solid 4 to 4+/5 to increase stability for performance of functional activities.    Time 8    Period Weeks    Status On-going      PT LONG TERM GOAL #6   Title Perform ADL's with pain not > 3/10.    Time 8    Period Weeks    Status On-going                 Plan - 11/30/19 0820    Clinical Impression Statement Patient presented in clinic with reports of decreased soreness even after a lot of housework yesterday. Patient progressed to more light strengthening without complaint of pain. Patient educated to complete at 50% effort due to R  wrist healing. No facial grimacing noted during treatment today. Firm end feels and smooth arc of motion noted during PROM of R shoulder. Normal vasopnuematic response noted following removal of the  modality.    Personal Factors and Comorbidities Comorbidity 1;Comorbidity 2    Comorbidities Hypothyroidism, DM, h/o back pain, hernia repair, SCDF, OA.    Examination-Activity Limitations Reach Overhead;Other    Examination-Participation Restrictions Other    Stability/Clinical Decision Making Stable/Uncomplicated    Rehab Potential Excellent    PT Frequency 2x / week    PT Duration 8 weeks    PT Treatment/Interventions ADLs/Self Care Home Management;Cryotherapy;Electrical Stimulation;Ultrasound;Moist Heat;Therapeutic activities;Therapeutic exercise;Patient/family education;Passive range of motion;Vasopneumatic Device    PT Next Visit Plan Begin with gentle right shoulder PROM.  Non-motoric electrical stimulation and vasopneumatic with pillow between elbow and thorax.    Consulted and Agree with Plan of Care Patient           Patient will benefit from skilled therapeutic intervention in order to improve the following deficits and impairments:  Pain, Decreased activity tolerance, Increased edema, Decreased range of motion  Visit Diagnosis: Acute pain of right shoulder  Stiffness of right shoulder, not elsewhere classified     Problem List Patient Active Problem List   Diagnosis Date Noted  . Sebaceous cyst   . Class 2 severe obesity with serious comorbidity and body mass index (BMI) of 35.0 to 35.9 in adult ([place]) 10/27/2017  . Other fatigue 09/10/2017  . Shortness of breath on exertion 09/10/2017  . Type 2 diabetes mellitus without complication, without long-term current use of insulin ([place]) 09/10/2017  . Essential hypertension 09/10/2017  . Other hyperlipidemia 09/10/2017  . Other specified hypothyroidism 09/10/2017  . Herniated cervical disc 08/16/2016    Standley Brooking, PTA 11/30/2019, 8:34 AM  [facility] [address] [place], [place], [address] Phone: [phone]   Fax:  [phone]  Name: Crystal Roberts MRN:  [id number] Date of Birth: 01-06-1956

## 2019-12-03 ENCOUNTER — Ambulatory Visit: Payer: 59 | Admitting: Physical Therapy

## 2019-12-03 ENCOUNTER — Encounter: Payer: Self-pay | Admitting: Physical Therapy

## 2019-12-03 ENCOUNTER — Other Ambulatory Visit: Payer: Self-pay

## 2019-12-03 DIAGNOSIS — M25611 Stiffness of right shoulder, not elsewhere classified: Secondary | ICD-10-CM | POA: Diagnosis not present

## 2019-12-03 DIAGNOSIS — M25511 Pain in right shoulder: Secondary | ICD-10-CM | POA: Diagnosis not present

## 2019-12-03 NOTE — Therapy (Signed)
[facility] [place], [place], [address] Phone: [phone]   Fax:  [phone]  Physical Therapy Treatment  Patient Details  Name: Crystal Roberts MRN: [id number] Date of Birth: 09-13-1955 Referring Provider (PT): Ophelia Charter MD   Encounter Date: 12/03/2019   PT End of Session - 12/03/19 0750    Visit Number 10    Number of Visits 16    Date for PT Re-Evaluation 12/29/19    Authorization Type FOTO.    PT Start Time [phone]    PT Stop Time 0819    PT Time Calculation (min) 31 min    Activity Tolerance Patient tolerated treatment well    Behavior During Therapy [facility] for tasks assessed/performed           Past Medical History:  Diagnosis Date  . Anxiety   . Arthritis    neck- cervical disc herniation   . Asthma    as child  . Back pain   . Cancer ([place])    skin - low back , insitu  melanoma   . Constipation   . COVID-19 09/07/2019   sx fever/cough/chills/lost taste and smell  . Depression   . Diabetes mellitus without complication ([place])   . Dyspnea   . Gallbladder problem   . GERD (gastroesophageal reflux disease)   . H/O cardiovascular stress test 1990   pt. reports that she was having a lot of stress & she was seen by cardiac- had stress test & told that it was negative   . Hyperlipidemia   . Hypertension   . Hypothyroidism   . Leg edema   . Pulmonary embolism ([place]) [place]. at [facility]- on blood thinning med., thought to be related to lack of activity, smoking & BCP    Past Surgical History:  Procedure Laterality Date  . ANTERIOR CERVICAL DECOMP/DISCECTOMY FUSION N/A 08/16/2016   Procedure: Cervical Four-Five Anterior cervical decompression/discectomy/fusion;  Surgeon: Erline Levine, MD;  Location: [place];  Service: Neurosurgery;  Laterality: N/A;  C4-5 Anterior cervical decompression/discectomy/fusion  . BACK SURGERY     sebaceous cyst removed  . CHOLECYSTECTOMY  6237   umbilical hernia ------ followed  cholecystectomy  . Cyst removed from back    . CYSTECTOMY Bilateral    cyst on ovaries  . HERNIA REPAIR     umbilical hernia repair   . MASS EXCISION N/A 11/21/2017   Procedure: EXCISION SEBACEOUS CYST ON BACK;  Surgeon: Aviva Signs, MD;  [facility]: [facility];  Service: General;  Laterality: N/A;  . SHOULDER ARTHROSCOPY WITH ROTATOR CUFF REPAIR AND SUBACROMIAL DECOMPRESSION Right 09/23/2019   Procedure: RIGHT SHOULDER ARTHROSCOPY DEBRIDEMENT, ACROMIOPLASTY, DISTAL CLAVICAL EXCISION ROTATOR CUFF REPAIR;  Surgeon: Hiram Gash, MD;  Location: [place];  Service: Orthopedics;  Laterality: Right;  . TUBAL LIGATION      There were no vitals filed for this visit.   Subjective Assessment - 12/03/19 0748    Subjective COVID 19 screening performed on patient upon arrival. Patient reports she has 1/10 shoulder discomfort as she hasn't done anything this morning.    Pertinent History Hypothyroidism, DM, h/o back pain, hernia repair, SCDF, OA.    Patient Stated Goals Use right arm without difficulty.    Currently in Pain? Yes    Pain Score 1     Pain Location Shoulder    Pain Orientation Right    Pain Descriptors / Indicators Sore    Pain Type Acute pain  Pain Onset More than a month ago    Pain Frequency Intermittent              [facility] PT Assessment - 12/03/19 0001      Assessment   Medical Diagnosis RT shld DCR, SAD, massive RTC repair.    Referring Provider (PT) Ophelia Charter MD    Onset Date/Surgical Date 09/23/19    Next MD Visit 12/07/2019      Precautions   Precaution Comments Follow protocol.                         [facility] Adult PT Treatment/Exercise - 12/03/19 0001      Shoulder Exercises: Supine   Protraction AROM;Right;20 reps    Flexion AROM;Right;15 reps    Other Supine Exercises R AROM upper cut x15 reps      Shoulder Exercises: Prone   Retraction AROM;Right;20 reps      Shoulder Exercises: Sidelying   External Rotation AROM;Right;20  reps      Shoulder Exercises: Pulleys   Flexion 3 minutes      Shoulder Exercises: ROM/Strengthening   [place] into flex wiht assist from LUE x10 reps      Shoulder Exercises: Isometric Strengthening   Flexion 5X5"    Extension 5X5"    External Rotation 5X5"    Internal Rotation 5X5"    ABduction 5X5"      Modalities   Modalities Vasopneumatic      Vasopneumatic   Number Minutes Vasopneumatic  10 minutes    Vasopnuematic Location  Shoulder    Vasopneumatic Pressure Low    Vasopneumatic Temperature  34/edema and pain                  PT Education - 12/03/19 0814    Education Details HEP- isometric flex, abd, ER, IR, ext and AROM upper cut    Person(s) Educated Patient    Methods Explanation;Demonstration;Handout    Comprehension Verbalized understanding               PT Long Term Goals - 11/05/19 6378      PT LONG TERM GOAL #1   Title Independent with a HEP.    Time 8    Period Weeks    Status On-going      PT LONG TERM GOAL #2   Title Active right shoulder flexion to 145 degrees so the patient can easily reach overhead.    Time 8    Period Weeks    Status On-going      PT LONG TERM GOAL #3   Title Active ER to 70 degrees+ to allow for easily donning/doffing of apparel.    Time 8    Period Weeks    Status On-going      PT LONG TERM GOAL #4   Title Increase ROM so patient is able to reach behind back to L4.    Time 8    Period Weeks    Status On-going      PT LONG TERM GOAL #5   Title Increase shoulder strength to a solid 4 to 4+/5 to increase stability for performance of functional activities.    Time 8    Period Weeks    Status On-going      PT LONG TERM GOAL #6   Title Perform ADL's with pain not > 3/10.    Time 8    Period Weeks    Status On-going  Plan - 12/03/19 0816    Clinical Impression Statement Patient progressed per protocol to more AROM exercises and isometric strengthening. Patient noted with  weakness against gravity into flexion especially. Greater weakness with elbow extension but also notable with elbow flexion for upper cuts. Patient indicated discomfort around R AC joint during antigravity exercises. Patient provided new HEP for light strengthening with instruction regarding technique. Patient verbalized understanding of HEP instructions. Normal vasopnuematic response noted following removal of the modality.    Personal Factors and Comorbidities Comorbidity 1;Comorbidity 2    Comorbidities Hypothyroidism, DM, h/o back pain, hernia repair, SCDF, OA.    Examination-Activity Limitations Reach Overhead;Other    Examination-Participation Restrictions Other    Stability/Clinical Decision Making Stable/Uncomplicated    Rehab Potential Excellent    PT Frequency 2x / week    PT Duration 8 weeks    PT Treatment/Interventions ADLs/Self Care Home Management;Cryotherapy;Electrical Stimulation;Ultrasound;Moist Heat;Therapeutic activities;Therapeutic exercise;Patient/family education;Passive range of motion;Vasopneumatic Device    PT Next Visit Plan Progress as symptoms dictate with light strengthening and AROM. 10 weeks post op as of 12/02/2019. Complete FOTO on 11th visit.    Consulted and Agree with Plan of Care Patient           Patient will benefit from skilled therapeutic intervention in order to improve the following deficits and impairments:  Pain, Decreased activity tolerance, Increased edema, Decreased range of motion  Visit Diagnosis: Acute pain of right shoulder  Stiffness of right shoulder, not elsewhere classified     Problem List Patient Active Problem List   Diagnosis Date Noted  . Sebaceous cyst   . Class 2 severe obesity with serious comorbidity and body mass index (BMI) of 35.0 to 35.9 in adult ([place]) 10/27/2017  . Other fatigue 09/10/2017  . Shortness of breath on exertion 09/10/2017  . Type 2 diabetes mellitus without complication, without long-term current use  of insulin ([place]) 09/10/2017  . Essential hypertension 09/10/2017  . Other hyperlipidemia 09/10/2017  . Other specified hypothyroidism 09/10/2017  . Herniated cervical disc 08/16/2016    Standley Brooking, PTA 12/03/2019, 8:22 AM  [facility] [place], [place], [address] Phone: [phone]   Fax:  [phone]  Name: Crystal Roberts MRN: [id number] Date of Birth: [birth year]  Progress Note Reporting Period 11/03/19 to 12/03/19.  See note below for Objective Data and Assessment of Progress/Goals.  Patient progressing well per protocol.    Mali Applegate MPT

## 2019-12-07 ENCOUNTER — Encounter: Payer: Self-pay | Admitting: Physical Therapy

## 2019-12-07 ENCOUNTER — Ambulatory Visit: Payer: 59 | Admitting: Physical Therapy

## 2019-12-07 ENCOUNTER — Other Ambulatory Visit: Payer: Self-pay

## 2019-12-07 DIAGNOSIS — M25511 Pain in right shoulder: Secondary | ICD-10-CM

## 2019-12-07 DIAGNOSIS — M25611 Stiffness of right shoulder, not elsewhere classified: Secondary | ICD-10-CM

## 2019-12-07 DIAGNOSIS — M25531 Pain in right wrist: Secondary | ICD-10-CM | POA: Diagnosis not present

## 2019-12-07 NOTE — Therapy (Signed)
[facility] [place], [place], [address] Phone: [phone]   Fax:  [phone]  Physical Therapy Treatment  Patient Details  Name: Crystal Roberts MRN: [id number] Date of Birth: 09-20-1955 Referring Provider (PT): Ophelia Charter MD   Encounter Date: 12/07/2019   PT End of Session - 12/07/19 0743    Visit Number 11    Number of Visits 16    Date for PT Re-Evaluation 12/29/19    Authorization Type FOTO.    PT Start Time [phone]    PT Stop Time 0819    PT Time Calculation (min) 41 min    Activity Tolerance Patient tolerated treatment well    Behavior During Therapy [facility] for tasks assessed/performed           Past Medical History:  Diagnosis Date  . Anxiety   . Arthritis    neck- cervical disc herniation   . Asthma    as child  . Back pain   . Cancer ([place])    skin - low back , insitu  melanoma   . Constipation   . COVID-19 09/07/2019   sx fever/cough/chills/lost taste and smell  . Depression   . Diabetes mellitus without complication ([place])   . Dyspnea   . Gallbladder problem   . GERD (gastroesophageal reflux disease)   . H/O cardiovascular stress test 1990   pt. reports that she was having a lot of stress & she was seen by cardiac- had stress test & told that it was negative   . Hyperlipidemia   . Hypertension   . Hypothyroidism   . Leg edema   . Pulmonary embolism ([place]) [place]. at [facility]- on blood thinning med., thought to be related to lack of activity, smoking & BCP    Past Surgical History:  Procedure Laterality Date  . ANTERIOR CERVICAL DECOMP/DISCECTOMY FUSION N/A 08/16/2016   Procedure: Cervical Four-Five Anterior cervical decompression/discectomy/fusion;  Surgeon: Erline Levine, MD;  Location: [place];  Service: Neurosurgery;  Laterality: N/A;  C4-5 Anterior cervical decompression/discectomy/fusion  . BACK SURGERY     sebaceous cyst removed  . CHOLECYSTECTOMY  2947   umbilical hernia ------ followed  cholecystectomy  . Cyst removed from back    . CYSTECTOMY Bilateral    cyst on ovaries  . HERNIA REPAIR     umbilical hernia repair   . MASS EXCISION N/A 11/21/2017   Procedure: EXCISION SEBACEOUS CYST ON BACK;  Surgeon: Aviva Signs, MD;  [facility];  Service: General;  Laterality: N/A;  . SHOULDER ARTHROSCOPY WITH ROTATOR CUFF REPAIR AND SUBACROMIAL DECOMPRESSION Right 09/23/2019   Procedure: RIGHT SHOULDER ARTHROSCOPY DEBRIDEMENT, ACROMIOPLASTY, DISTAL CLAVICAL EXCISION ROTATOR CUFF REPAIR;  Surgeon: Hiram Gash, MD;  Location: [place];  Service: Orthopedics;  Laterality: Right;  . TUBAL LIGATION      There were no vitals filed for this visit.   Subjective Assessment - 12/07/19 0740    Subjective COVID 19 screening performed on patient upon arrival. Patient reports feeling sore but it always stay sore, 2/10.    Pertinent History Hypothyroidism, DM, h/o back pain, hernia repair, SCDF, OA.    Patient Stated Goals Use right arm without difficulty.    Currently in Pain? Yes    Pain Score 2     Pain Location Shoulder    Pain Orientation Right    Pain Descriptors / Indicators Sore    Pain Type Acute pain    Pain Onset More than  a month ago    Pain Frequency Intermittent              [facility] PT Assessment - 12/07/19 0001      Assessment   Medical Diagnosis RT shld DCR, SAD, massive RTC repair.    Referring Provider (PT) Ophelia Charter MD    Onset Date/Surgical Date 09/23/19    Next MD Visit 12/07/2019      Precautions   Precaution Comments Follow protocol.      ROM / Strength   AROM / PROM / Strength AROM;PROM      AROM   AROM Assessment Site Shoulder    Right/Left Shoulder Right    Right Shoulder Flexion 60 Degrees    Right Shoulder External Rotation 63 Degrees      PROM   PROM Assessment Site Shoulder    Right/Left Shoulder Right    Right Shoulder Flexion 150 Degrees    Right Shoulder Internal Rotation 52 Degrees    Right Shoulder External  Rotation 68 Degrees                         [facility] Adult PT Treatment/Exercise - 12/07/19 0001      Shoulder Exercises: Supine   Protraction AAROM;Both;20 reps    External Rotation AAROM;Right;20 reps      Shoulder Exercises: Pulleys   Flexion 5 minutes      Shoulder Exercises: ROM/Strengthening   Ranger standing flexion, CW, CCW circles x2 mins each      Modalities   Modalities Vasopneumatic      Vasopneumatic   Number Minutes Vasopneumatic  10 minutes    Vasopnuematic Location  Shoulder    Vasopneumatic Pressure Low    Vasopneumatic Temperature  34/edema and pain      Manual Therapy   Manual Therapy Passive ROM    Passive ROM PROM of R shoulder into flexion, ER, IR with holds at end range                       PT Long Term Goals - 12/07/19 9024      PT LONG TERM GOAL #1   Title Independent with a HEP.    Time 8    Period Weeks    Status Achieved      PT LONG TERM GOAL #2   Title Active right shoulder flexion to 145 degrees so the patient can easily reach overhead.    Time 8    Period Weeks    Status On-going      PT LONG TERM GOAL #3   Title Active ER to 70 degrees+ to allow for easily donning/doffing of apparel.    Time 8      PT LONG TERM GOAL #4   Title Increase ROM so patient is able to reach behind back to L4.    Time 8    Period Weeks    Status On-going      PT LONG TERM GOAL #5   Title Increase shoulder strength to a solid 4 to 4+/5 to increase stability for performance of functional activities.    Time 8    Period Weeks    Status On-going      PT LONG TERM GOAL #6   Title Perform ADL's with pain not > 3/10.    Time 8    Period Weeks    Status On-going  Plan - 12/07/19 0746    Clinical Impression Statement Patient responded fairly well to therapy session with progression of AAROM exercises. Patient provided with intermittent rest breaks throughout session to decrease soreness and fatigue.  See flow sheet for PROM measurements. Goals ongoing at this time. No adverse affects to modalities upon removal. 48% limitation.    Personal Factors and Comorbidities Comorbidity 1;Comorbidity 2    Comorbidities Hypothyroidism, DM, h/o back pain, hernia repair, SCDF, OA.    Examination-Activity Limitations Reach Overhead;Other    Examination-Participation Restrictions Other    Stability/Clinical Decision Making Stable/Uncomplicated    Clinical Decision Making Low    Rehab Potential Excellent    PT Frequency 2x / week    PT Duration 8 weeks    PT Treatment/Interventions ADLs/Self Care Home Management;Cryotherapy;Electrical Stimulation;Ultrasound;Moist Heat;Therapeutic activities;Therapeutic exercise;Patient/family education;Passive range of motion;Vasopneumatic Device    PT Next Visit Plan Progress as symptoms dictate with light strengthening and AROM. 10 weeks post op as of 12/02/2019. Complete FOTO on 11th visit.    Consulted and Agree with Plan of Care Patient           Patient will benefit from skilled therapeutic intervention in order to improve the following deficits and impairments:  Pain, Decreased activity tolerance, Increased edema, Decreased range of motion  Visit Diagnosis: Acute pain of right shoulder  Stiffness of right shoulder, not elsewhere classified     Problem List Patient Active Problem List   Diagnosis Date Noted  . Sebaceous cyst   . Class 2 severe obesity with serious comorbidity and body mass index (BMI) of 35.0 to 35.9 in adult ([place]) 10/27/2017  . Other fatigue 09/10/2017  . Shortness of breath on exertion 09/10/2017  . Type 2 diabetes mellitus without complication, without long-term current use of insulin ([place]) 09/10/2017  . Essential hypertension 09/10/2017  . Other hyperlipidemia 09/10/2017  . Other specified hypothyroidism 09/10/2017  . Herniated cervical disc 08/16/2016    Gabriela Eves, PT, DPT 12/07/2019, 8:26 AM  [facility] [address] [place], [place], [address] Phone: [phone]   Fax:  [phone]  Name: Crystal Roberts MRN: [id number] Date of Birth: 22-Mar-1955

## 2019-12-08 ENCOUNTER — Encounter: Payer: 59 | Admitting: Physical Therapy

## 2019-12-14 ENCOUNTER — Other Ambulatory Visit: Payer: Self-pay

## 2019-12-14 ENCOUNTER — Ambulatory Visit: Payer: 59 | Admitting: *Deleted

## 2019-12-14 DIAGNOSIS — M25511 Pain in right shoulder: Secondary | ICD-10-CM | POA: Diagnosis not present

## 2019-12-14 DIAGNOSIS — M25611 Stiffness of right shoulder, not elsewhere classified: Secondary | ICD-10-CM

## 2019-12-14 NOTE — Therapy (Signed)
[facility] [place], [place], [address] Phone: [phone]   Fax:  [phone]  Physical Therapy Treatment  Patient Details  Name: CARAMIA BOUTIN MRN: [id number] Date of Birth: February 01, 1955 Referring Provider (PT): Ophelia Charter MD   Encounter Date: 12/14/2019   PT End of Session - 12/14/19 1522    Visit Number 12    Number of Visits 16    Date for PT Re-Evaluation 12/29/19    Authorization Type FOTO.    PT Start Time 1515    PT Stop Time 1606    PT Time Calculation (min) 51 min           Past Medical History:  Diagnosis Date  . Anxiety   . Arthritis    neck- cervical disc herniation   . Asthma    as child  . Back pain   . Cancer ([place])    skin - low back , insitu  melanoma   . Constipation   . COVID-19 09/07/2019   sx fever/cough/chills/lost taste and smell  . Depression   . Diabetes mellitus without complication ([place])   . Dyspnea   . Gallbladder problem   . GERD (gastroesophageal reflux disease)   . H/O cardiovascular stress test 1990   pt. reports that she was having a lot of stress & she was seen by cardiac- had stress test & told that it was negative   . Hyperlipidemia   . Hypertension   . Hypothyroidism   . Leg edema   . Pulmonary embolism ([place]) [place]. at [facility]- on blood thinning med., thought to be related to lack of activity, smoking & BCP    Past Surgical History:  Procedure Laterality Date  . ANTERIOR CERVICAL DECOMP/DISCECTOMY FUSION N/A 08/16/2016   Procedure: Cervical Four-Five Anterior cervical decompression/discectomy/fusion;  Surgeon: Erline Levine, MD;  Location: [place];  Service: Neurosurgery;  Laterality: N/A;  C4-5 Anterior cervical decompression/discectomy/fusion  . BACK SURGERY     sebaceous cyst removed  . CHOLECYSTECTOMY  0867   umbilical hernia ------ followed cholecystectomy  . Cyst removed from back    . CYSTECTOMY Bilateral    cyst on ovaries  . HERNIA REPAIR      umbilical hernia repair   . MASS EXCISION N/A 11/21/2017   Procedure: EXCISION SEBACEOUS CYST ON BACK;  Surgeon: Aviva Signs, MD;  [facility];  Service: General;  Laterality: N/A;  . SHOULDER ARTHROSCOPY WITH ROTATOR CUFF REPAIR AND SUBACROMIAL DECOMPRESSION Right 09/23/2019   Procedure: RIGHT SHOULDER ARTHROSCOPY DEBRIDEMENT, ACROMIOPLASTY, DISTAL CLAVICAL EXCISION ROTATOR CUFF REPAIR;  Surgeon: Hiram Gash, MD;  Location: [place];  Service: Orthopedics;  Laterality: Right;  . TUBAL LIGATION      There were no vitals filed for this visit.   Subjective Assessment - 12/14/19 1518    Subjective COVID 19 screening performed on patient upon arrival. Patient reports feeling sore but it always stay sore, 1-2/10.    Pertinent History Hypothyroidism, DM, h/o back pain, hernia repair, SCDF, OA.    Patient Stated Goals Use right arm without difficulty.    Currently in Pain? Yes    Pain Score 2     Pain Location Shoulder    Pain Orientation Right    Pain Descriptors / Indicators Sore    Pain Type Acute pain    Pain Onset More than a month ago  [facility] Adult PT Treatment/Exercise - 12/14/19 0001      Shoulder Exercises: Supine   Protraction AAROM;Both;20 reps    External Rotation AROM    Flexion AAROM;Both;20 reps      Shoulder Exercises: Sidelying   External Rotation AROM;Right;20 reps      Shoulder Exercises: Pulleys   Flexion 5 minutes      Shoulder Exercises: ROM/Strengthening   R[place] standing flexion, CW, CCW circles x2 mins each      Modalities   Modalities Vasopneumatic      Vasopneumatic   Number Minutes Vasopneumatic  10 minutes    Vasopnuematic Location  Shoulder    Vasopneumatic Pressure Low    Vasopneumatic Temperature  34/edema and pain      Manual Therapy   Manual Therapy Passive ROM    Passive ROM PROM and rhythmic stab of R shoulder into flexion, ER, IR with holds at end range                        PT Long Term Goals - 12/07/19 0973      PT LONG TERM GOAL #1   Title Independent with a HEP.    Time 8    Period Weeks    Status Achieved      PT LONG TERM GOAL #2   Title Active right shoulder flexion to 145 degrees so the patient can easily reach overhead.    Time 8    Period Weeks    Status On-going      PT LONG TERM GOAL #3   Title Active ER to 70 degrees+ to allow for easily donning/doffing of apparel.    Time 8      PT LONG TERM GOAL #4   Title Increase ROM so patient is able to reach behind back to L4.    Time 8    Period Weeks    Status On-going      PT LONG TERM GOAL #5   Title Increase shoulder strength to a solid 4 to 4+/5 to increase stability for performance of functional activities.    Time 8    Period Weeks    Status On-going      PT LONG TERM GOAL #6   Title Perform ADL's with pain not > 3/10.    Time 8    Period Weeks    Status On-going                 Plan - 12/14/19 1522    Clinical Impression Statement Pt arrived today doing fairly well with low pain levels Rt shldr. She did well with AROM and AAROM exs as well as PROM.Marland Kitchen Pt still compensates in standing elevation, but does better in supine. Pt advised to avoid shldr hiking compensatory movements.    Personal Factors and Comorbidities Comorbidity 1;Comorbidity 2    Comorbidities Hypothyroidism, DM, h/o back pain, hernia repair, SCDF, OA.    Examination-Activity Limitations Reach Overhead;Other    Examination-Participation Restrictions Other    Rehab Potential Excellent    PT Frequency 2x / week    PT Duration 8 weeks    PT Treatment/Interventions ADLs/Self Care Home Management;Cryotherapy;Electrical Stimulation;Ultrasound;Moist Heat;Therapeutic activities;Therapeutic exercise;Patient/family education;Passive range of motion;Vasopneumatic Device    PT Next Visit Plan Progress as symptoms dictate with light strengthening and AROM. 12 weeks post op as of 12/16/2019.     Consulted and Agree with Plan of Care Patient  Patient will benefit from skilled therapeutic intervention in order to improve the following deficits and impairments:  Pain, Decreased activity tolerance, Increased edema, Decreased range of motion  Visit Diagnosis: Acute pain of right shoulder  Stiffness of right shoulder, not elsewhere classified     Problem List Patient Active Problem List   Diagnosis Date Noted  . Sebaceous cyst   . Class 2 severe obesity with serious comorbidity and body mass index (BMI) of 35.0 to 35.9 in adult ([place]) 10/27/2017  . Other fatigue 09/10/2017  . Shortness of breath on exertion 09/10/2017  . Type 2 diabetes mellitus without complication, without long-term current use of insulin ([place]) 09/10/2017  . Essential hypertension 09/10/2017  . Other hyperlipidemia 09/10/2017  . Other specified hypothyroidism 09/10/2017  . Herniated cervical disc 08/16/2016    Arianah Torgeson,CHRIS, PTA 12/14/2019, 4:19 PM  [facility] [place], [place], [address] Phone: [phone]   Fax:  [phone]  Name: SHAKIAH WESTER MRN: [id number] Date of Birth: 03/25/1955

## 2019-12-16 ENCOUNTER — Other Ambulatory Visit: Payer: Self-pay

## 2019-12-16 ENCOUNTER — Ambulatory Visit: Payer: 59 | Attending: Orthopaedic Surgery | Admitting: *Deleted

## 2019-12-16 DIAGNOSIS — M25511 Pain in right shoulder: Secondary | ICD-10-CM | POA: Insufficient documentation

## 2019-12-16 DIAGNOSIS — M25611 Stiffness of right shoulder, not elsewhere classified: Secondary | ICD-10-CM | POA: Diagnosis not present

## 2019-12-16 NOTE — Therapy (Signed)
[facility] [place], [place], [address] Phone: [phone]   Fax:  [phone]  Physical Therapy Treatment  Patient Details  Name: Crystal Roberts MRN: [id number] Date of Birth: 1955/09/08 Referring Provider (PT): Ophelia Charter MD   Encounter Date: 12/16/2019   PT End of Session - 12/16/19 1749    Visit Number 13    Number of Visits 16    Date for PT Re-Evaluation 12/29/19    Authorization Type FOTO.    PT Start Time 1600    PT Stop Time 1650    PT Time Calculation (min) 50 min           Past Medical History:  Diagnosis Date  . Anxiety   . Arthritis    neck- cervical disc herniation   . Asthma    as child  . Back pain   . Cancer ([place])    skin - low back , insitu  melanoma   . Constipation   . COVID-19 09/07/2019   sx fever/cough/chills/lost taste and smell  . Depression   . Diabetes mellitus without complication ([place])   . Dyspnea   . Gallbladder problem   . GERD (gastroesophageal reflux disease)   . H/O cardiovascular stress test 1990   pt. reports that she was having a lot of stress & she was seen by cardiac- had stress test & told that it was negative   . Hyperlipidemia   . Hypertension   . Hypothyroidism   . Leg edema   . Pulmonary embolism ([place]) [place]. at [facility]- on blood thinning med., thought to be related to lack of activity, smoking & BCP    Past Surgical History:  Procedure Laterality Date  . ANTERIOR CERVICAL DECOMP/DISCECTOMY FUSION N/A 08/16/2016   Procedure: Cervical Four-Five Anterior cervical decompression/discectomy/fusion;  Surgeon: Erline Levine, MD;  Location: [place];  Service: Neurosurgery;  Laterality: N/A;  C4-5 Anterior cervical decompression/discectomy/fusion  . BACK SURGERY     sebaceous cyst removed  . CHOLECYSTECTOMY  0962   umbilical hernia ------ followed cholecystectomy  . Cyst removed from back    . CYSTECTOMY Bilateral    cyst on ovaries  . HERNIA REPAIR      umbilical hernia repair   . MASS EXCISION N/A 11/21/2017   Procedure: EXCISION SEBACEOUS CYST ON BACK;  Surgeon: Aviva Signs, MD;  Location: [facility];  Service: General;  Laterality: N/A;  . SHOULDER ARTHROSCOPY WITH ROTATOR CUFF REPAIR AND SUBACROMIAL DECOMPRESSION Right 09/23/2019   Procedure: RIGHT SHOULDER ARTHROSCOPY DEBRIDEMENT, ACROMIOPLASTY, DISTAL CLAVICAL EXCISION ROTATOR CUFF REPAIR;  Surgeon: Hiram Gash, MD;  Location: [place];  Service: Orthopedics;  Laterality: Right;  . TUBAL LIGATION      There were no vitals filed for this visit.                      [facility] Adult PT Treatment/Exercise - 12/16/19 0001      Shoulder Exercises: Supine   Protraction AROM;Left;20 reps   circles x 10 CW/ CCW     Shoulder Exercises: Pulleys   Flexion 5 minutes      Shoulder Exercises: ROM/Strengthening   Ranger standing flexion, CW, CCW circles 3x 10 each      Modalities   Modalities Vasopneumatic      Vasopneumatic   Number Minutes Vasopneumatic  10 minutes    Vasopnuematic Location  Shoulder    Vasopneumatic Pressure Low    Vasopneumatic Temperature  34/edema and pain      Manual Therapy   Manual Therapy Passive ROM    Passive ROM PROM and rhythmic stab of R shoulder into flexion, ER, IR with holds at end range                       PT Long Term Goals - 12/07/19 8250      PT LONG TERM GOAL #1   Title Independent with a HEP.    Time 8    Period Weeks    Status Achieved      PT LONG TERM GOAL #2   Title Active right shoulder flexion to 145 degrees so the patient can easily reach overhead.    Time 8    Period Weeks    Status On-going      PT LONG TERM GOAL #3   Title Active ER to 70 degrees+ to allow for easily donning/doffing of apparel.    Time 8      PT LONG TERM GOAL #4   Title Increase ROM so patient is able to reach behind back to L4.    Time 8    Period Weeks    Status On-going      PT LONG TERM GOAL #5    Title Increase shoulder strength to a solid 4 to 4+/5 to increase stability for performance of functional activities.    Time 8    Period Weeks    Status On-going      PT LONG TERM GOAL #6   Title Perform ADL's with pain not > 3/10.    Time 8    Period Weeks    Status On-going                 Plan - 12/16/19 1750    Clinical Impression Statement Pt arrived today doing fairly well, but with increased soreness. BTW for 2 days now and doing ok. Pt did well with AROM and AAROM exs with  RT UE and did well with mainly fatigue. Normal Vaso treponse. Pt reports being able to use RT arm more fore ADL's    Personal Factors and Comorbidities Comorbidity 1;Comorbidity 2    Comorbidities Hypothyroidism, DM, h/o back pain, hernia repair, SCDF, OA.    Examination-Participation Restrictions Other    Rehab Potential Excellent    PT Frequency 2x / week    PT Duration 8 weeks    PT Treatment/Interventions ADLs/Self Care Home Management;Cryotherapy;Electrical Stimulation;Ultrasound;Moist Heat;Therapeutic activities;Therapeutic exercise;Patient/family education;Passive range of motion;Vasopneumatic Device    PT Next Visit Plan Progress as symptoms dictate with light strengthening and AROM. 12 weeks post op as of 12/16/2019.    Consulted and Agree with Plan of Care Patient           Patient will benefit from skilled therapeutic intervention in order to improve the following deficits and impairments:  Pain, Decreased activity tolerance, Increased edema, Decreased range of motion  Visit Diagnosis: Acute pain of right shoulder  Stiffness of right shoulder, not elsewhere classified     Problem List Patient Active Problem List   Diagnosis Date Noted  . Sebaceous cyst   . Class 2 severe obesity with serious comorbidity and body mass index (BMI) of 35.0 to 35.9 in adult ([place]) 10/27/2017  . Other fatigue 09/10/2017  . Shortness of breath on exertion 09/10/2017  . Type 2 diabetes mellitus  without complication, without long-term current use of insulin ([place]) 09/10/2017  . Essential hypertension 09/10/2017  .  Other hyperlipidemia 09/10/2017  . Other specified hypothyroidism 09/10/2017  . Herniated cervical disc 08/16/2016    Pressley Tadesse,CHRIS, PTA 12/16/2019, 5:55 PM  [facility] [place], [place], [address] Phone: [phone]   Fax:  [phone]  Name: Crystal Roberts MRN: [id number] Date of Birth: 09-Jun-1955

## 2019-12-21 ENCOUNTER — Encounter: Payer: Self-pay | Admitting: Physical Therapy

## 2019-12-21 ENCOUNTER — Ambulatory Visit: Payer: 59 | Admitting: Physical Therapy

## 2019-12-21 ENCOUNTER — Other Ambulatory Visit: Payer: Self-pay

## 2019-12-21 DIAGNOSIS — M25611 Stiffness of right shoulder, not elsewhere classified: Secondary | ICD-10-CM

## 2019-12-21 DIAGNOSIS — M25511 Pain in right shoulder: Secondary | ICD-10-CM

## 2019-12-21 NOTE — Therapy (Signed)
[facility] [place], [place], [address] Phone: [phone]   Fax:  [phone]  Physical Therapy Treatment  Patient Details  Name: Crystal Roberts MRN: [id number] Date of Birth: [birth year] Referring Provider (PT): Ophelia Charter MD   Encounter Date: 12/21/2019   PT End of Session - 12/21/19 1640    Visit Number 14    Number of Visits 16    Date for PT Re-Evaluation 12/29/19    Authorization Type FOTO.    PT Start Time 1632    PT Stop Time 1715    PT Time Calculation (min) 43 min    Activity Tolerance Patient tolerated treatment well    Behavior During Therapy WFL for tasks assessed/performed           Past Medical History:  Diagnosis Date  . Anxiety   . Arthritis    neck- cervical disc herniation   . Asthma    as child  . Back pain   . Cancer ([place])    skin - low back , insitu  melanoma   . Constipation   . COVID-19 09/07/2019   sx fever/cough/chills/lost taste and smell  . Depression   . Diabetes mellitus without complication ([place])   . Dyspnea   . Gallbladder problem   . GERD (gastroesophageal reflux disease)   . H/O cardiovascular stress test 1990   pt. reports that she was having a lot of stress & she was seen by cardiac- had stress test & told that it was negative   . Hyperlipidemia   . Hypertension   . Hypothyroidism   . Leg edema   . Pulmonary embolism ([place]) [place]. at [facility]- on blood thinning med., thought to be related to lack of activity, smoking & BCP    Past Surgical History:  Procedure Laterality Date  . ANTERIOR CERVICAL DECOMP/DISCECTOMY FUSION N/A 08/16/2016   Procedure: Cervical Four-Five Anterior cervical decompression/discectomy/fusion;  Surgeon: Erline Levine, MD;  Location: [place];  Service: Neurosurgery;  Laterality: N/A;  C4-5 Anterior cervical decompression/discectomy/fusion  . BACK SURGERY     sebaceous cyst removed  . CHOLECYSTECTOMY  2025   umbilical hernia ------ followed  cholecystectomy  . Cyst removed from back    . CYSTECTOMY Bilateral    cyst on ovaries  . HERNIA REPAIR     umbilical hernia repair   . MASS EXCISION N/A 11/21/2017   Procedure: EXCISION SEBACEOUS CYST ON BACK;  Surgeon: Aviva Signs, MD;  Location[facility];  Service: General;  Laterality: N/A;  . SHOULDER ARTHROSCOPY WITH ROTATOR CUFF REPAIR AND SUBACROMIAL DECOMPRESSION Right 09/23/2019   Procedure: RIGHT SHOULDER ARTHROSCOPY DEBRIDEMENT, ACROMIOPLASTY, DISTAL CLAVICAL EXCISION ROTATOR CUFF REPAIR;  Surgeon: Hiram Gash, MD;  Location: [place];  Service: Orthopedics;  Laterality: Right;  . TUBAL LIGATION      There were no vitals filed for this visit.   Subjective Assessment - 12/21/19 1634    Subjective COVID 19 screening performed on patient upon arrival. Reports increased pain this morning after L SL during the night and she woke up and tried to move RUE. When she did she had an intense pain. Better now as she used biofreeze throughout the day.    Pertinent History Hypothyroidism, DM, h/o back pain, hernia repair, SCDF, OA.    Patient Stated Goals Use right arm without difficulty.    Currently in Pain? Yes    Pain Score --   No pain score provided  Pain Location Shoulder    Pain Orientation Right    Pain Descriptors / Indicators Discomfort    Pain Type Acute pain    Pain Onset More than a month ago    Pain Frequency Constant              [facility] PT Assessment - 12/21/19 0001      Assessment   Medical Diagnosis RT shld DCR, SAD, massive RTC repair.    Referring Provider (PT) Ophelia Charter MD    Onset Date/Surgical Date 09/23/19    Next MD Visit 01/06/2020      Precautions   Precaution Comments Follow protocol.                         [facility] Adult PT Treatment/Exercise - 12/21/19 0001      Shoulder Exercises: Supine   Protraction AROM;Left;15 reps    Flexion Other (comment)   Attempted but limited by pain   Other Supine Exercises R AROM  upper cut x15 reps      Shoulder Exercises: Pulleys   Flexion 5 minutes      Shoulder Exercises: ROM/Strengthening   UBE (Upper Arm Bike) 120 RPM x6 min (forward/backward)    Ranger standing flexion, CW, CCW circles 3x 10 each      Modalities   Modalities Vasopneumatic      Vasopneumatic   Number Minutes Vasopneumatic  10 minutes    Vasopnuematic Location  Shoulder    Vasopneumatic Pressure Low    Vasopneumatic Temperature  34/edema and pain      Manual Therapy   Manual Therapy Passive ROM    Passive ROM PROM of R shoulder into flex, ER with holds at end range                       PT Long Term Goals - 12/07/19 5035      PT LONG TERM GOAL #1   Title Independent with a HEP.    Time 8    Period Weeks    Status Achieved      PT LONG TERM GOAL #2   Title Active right shoulder flexion to 145 degrees so the patient can easily reach overhead.    Time 8    Period Weeks    Status On-going      PT LONG TERM GOAL #3   Title Active ER to 70 degrees+ to allow for easily donning/doffing of apparel.    Time 8      PT LONG TERM GOAL #4   Title Increase ROM so patient is able to reach behind back to L4.    Time 8    Period Weeks    Status On-going      PT LONG TERM GOAL #5   Title Increase shoulder strength to a solid 4 to 4+/5 to increase stability for performance of functional activities.    Time 8    Period Weeks    Status On-going      PT LONG TERM GOAL #6   Title Perform ADL's with pain not > 3/10.    Time 8    Period Weeks    Status On-going                 Plan - 12/21/19 1730    Clinical Impression Statement Patient presented in clinic with reports of increased R shoulder pain after trying to move her RUE after sleeping in L  SL. Pateint has used biofreeze throughout the day to assist with pain. Patient limited with supine shoulder flexion due to pain. Able to complete short lever R shoulder flexion and protraction but limited by weakness. Firm  end feels and smooth arc of motion noted during PROM. Normal vasopneumatic response noted following removal of the modality.    Personal Factors and Comorbidities Comorbidity 1;Comorbidity 2    Comorbidities Hypothyroidism, DM, h/o back pain, hernia repair, SCDF, OA.    Examination-Activity Limitations Reach Overhead;Other    Examination-Participation Restrictions Other    Stability/Clinical Decision Making Stable/Uncomplicated    Rehab Potential Excellent    PT Frequency 2x / week    PT Duration 8 weeks    PT Treatment/Interventions ADLs/Self Care Home Management;Cryotherapy;Electrical Stimulation;Ultrasound;Moist Heat;Therapeutic activities;Therapeutic exercise;Patient/family education;Passive range of motion;Vasopneumatic Device    PT Next Visit Plan Progress as symptoms dictate with light strengthening and AROM. 12 weeks post op as of 12/16/2019.    Consulted and Agree with Plan of Care Patient           Patient will benefit from skilled therapeutic intervention in order to improve the following deficits and impairments:  Pain, Decreased activity tolerance, Increased edema, Decreased range of motion  Visit Diagnosis: Acute pain of right shoulder  Stiffness of right shoulder, not elsewhere classified     Problem List Patient Active Problem List   Diagnosis Date Noted  . Sebaceous cyst   . Class 2 severe obesity with serious comorbidity and body mass index (BMI) of 35.0 to 35.9 in adult ([place]) 10/27/2017  . Other fatigue 09/10/2017  . Shortness of breath on exertion 09/10/2017  . Type 2 diabetes mellitus without complication, without long-term current use of insulin ([place]) 09/10/2017  . Essential hypertension 09/10/2017  . Other hyperlipidemia 09/10/2017  . Other specified hypothyroidism 09/10/2017  . Herniated cervical disc 08/16/2016    Standley Brooking, PTA 12/21/2019, 5:40 PM  [facility] [place], [place],  [address] Phone: [phone]   Fax:  [phone]  Name: Crystal Roberts MRN: [id number] Date of Birth: August 01, 1955

## 2019-12-23 ENCOUNTER — Encounter: Payer: Self-pay | Admitting: Physical Therapy

## 2019-12-23 ENCOUNTER — Ambulatory Visit: Payer: 59 | Admitting: Physical Therapy

## 2019-12-23 DIAGNOSIS — M25611 Stiffness of right shoulder, not elsewhere classified: Secondary | ICD-10-CM | POA: Diagnosis not present

## 2019-12-23 DIAGNOSIS — M25511 Pain in right shoulder: Secondary | ICD-10-CM | POA: Diagnosis not present

## 2019-12-23 NOTE — Therapy (Signed)
[facility] [place], [place], [address] Phone: [phone]   Fax:  [phone]  Physical Therapy Treatment  Patient Details  Name: Crystal Roberts MRN: [id number] Date of Birth: Dec 03, 1955 Referring Provider (PT): Ophelia Charter MD   Encounter Date: 12/23/2019   PT End of Session - 12/23/19 1648    Visit Number 15    Number of Visits 16    Date for PT Re-Evaluation 12/29/19    Authorization Type FOTO.    PT Start Time 1646    PT Stop Time 1734    PT Time Calculation (min) 48 min    Activity Tolerance Patient tolerated treatment well    Behavior During Therapy WFL for tasks assessed/performed           Past Medical History:  Diagnosis Date  . Anxiety   . Arthritis    neck- cervical disc herniation   . Asthma    as child  . Back pain   . Cancer ([place])    skin - low back , insitu  melanoma   . Constipation   . COVID-19 09/07/2019   sx fever/cough/chills/lost taste and smell  . Depression   . Diabetes mellitus without complication ([place])   . Dyspnea   . Gallbladder problem   . GERD (gastroesophageal reflux disease)   . H/O cardiovascular stress test 1990   pt. reports that she was having a lot of stress & she was seen by cardiac- had stress test & told that it was negative   . Hyperlipidemia   . Hypertension   . Hypothyroidism   . Leg edema   . Pulmonary embolism ([place]) [place]. at [facility]- on blood thinning med., thought to be related to lack of activity, smoking & BCP    Past Surgical History:  Procedure Laterality Date  . ANTERIOR CERVICAL DECOMP/DISCECTOMY FUSION N/A 08/16/2016   Procedure: Cervical Four-Five Anterior cervical decompression/discectomy/fusion;  Surgeon: Erline Levine, MD;  Location: [place];  Service: Neurosurgery;  Laterality: N/A;  C4-5 Anterior cervical decompression/discectomy/fusion  . BACK SURGERY     sebaceous cyst removed  . CHOLECYSTECTOMY  7517   umbilical hernia ------ followed  cholecystectomy  . Cyst removed from back    . CYSTECTOMY Bilateral    cyst on ovaries  . HERNIA REPAIR     umbilical hernia repair   . MASS EXCISION N/A 11/21/2017   Procedure: EXCISION SEBACEOUS CYST ON BACK;  Surgeon: Aviva Signs, MD;  Location: [facility];  Service: General;  Laterality: N/A;  . SHOULDER ARTHROSCOPY WITH ROTATOR CUFF REPAIR AND SUBACROMIAL DECOMPRESSION Right 09/23/2019   Procedure: RIGHT SHOULDER ARTHROSCOPY DEBRIDEMENT, ACROMIOPLASTY, DISTAL CLAVICAL EXCISION ROTATOR CUFF REPAIR;  Surgeon: Hiram Gash, MD;  Location: [place];  Service: Orthopedics;  Laterality: Right;  . TUBAL LIGATION      There were no vitals filed for this visit.   Subjective Assessment - 12/23/19 1647    Subjective COVID 19 screening performed on patient upon arrival. Patient reports she has had less pain since Tuesday but still has pain intermittantly.    Pertinent History Hypothyroidism, DM, h/o back pain, hernia repair, SCDF, OA.    Patient Stated Goals Use right arm without difficulty.    Currently in Pain? Yes    Pain Score 2     Pain Location Shoulder    Pain Orientation Right    Pain Descriptors / Indicators Sore    Pain Type Surgical pain  Pain Onset More than a month ago    Pain Frequency Intermittent              [facility] PT Assessment - 12/23/19 0001      Assessment   Medical Diagnosis RT shld DCR, SAD, massive RTC repair.    Referring Provider (PT) Ophelia Charter MD    Onset Date/Surgical Date 09/23/19    Next MD Visit 01/06/2020      Precautions   Precaution Comments Follow protocol.                         [facility] Adult PT Treatment/Exercise - 12/23/19 0001      Shoulder Exercises: Supine   Protraction AROM;Left;15 reps    Protraction Limitations fatigued      Shoulder Exercises: Seated   Other Seated Exercises R bicep curl x20 reps    Other Seated Exercises R upper cut x10 reps      Shoulder Exercises: Pulleys   Flexion 5 minutes       Shoulder Exercises: ROM/Strengthening   UBE (Upper Arm Bike) 120 RPM x6 min (forward/backward)    [place] CW circles x15 reps   fatigued     Modalities   Modalities Vasopneumatic      Vasopneumatic   Number Minutes Vasopneumatic  10 minutes    Vasopnuematic Location  Shoulder    Vasopneumatic Pressure Low    Vasopneumatic Temperature  34/edema and pain      Manual Therapy   Manual Therapy Passive ROM    Passive ROM PROM of R shoulder into flex, ER with holds at end range                       PT Long Term Goals - 12/07/19 4665      PT LONG TERM GOAL #1   Title Independent with a HEP.    Time 8    Period Weeks    Status Achieved      PT LONG TERM GOAL #2   Title Active right shoulder flexion to 145 degrees so the patient can easily reach overhead.    Time 8    Period Weeks    Status On-going      PT LONG TERM GOAL #3   Title Active ER to 70 degrees+ to allow for easily donning/doffing of apparel.    Time 8      PT LONG TERM GOAL #4   Title Increase ROM so patient is able to reach behind back to L4.    Time 8    Period Weeks    Status On-going      PT LONG TERM GOAL #5   Title Increase shoulder strength to a solid 4 to 4+/5 to increase stability for performance of functional activities.    Time 8    Period Weeks    Status On-going      PT LONG TERM GOAL #6   Title Perform ADL's with pain not > 3/10.    Time 8    Period Weeks    Status On-going                 Plan - 12/23/19 1726    Clinical Impression Statement Patient presented in clinic with some minimal R shoulder soreness. Patient limited due to fatigue after typing a lot more today and with elbow discomfort as well. Patient fatigued very quickly with therex today. Firm end feels and  smooth arc of motion noted during PROM in all directions. Normal vasopneumatic response noted following removal of the modality.    Personal Factors and Comorbidities Comorbidity 1;Comorbidity 2     Comorbidities Hypothyroidism, DM, h/o back pain, hernia repair, SCDF, OA.    Examination-Activity Limitations Reach Overhead;Other    Examination-Participation Restrictions Other    Stability/Clinical Decision Making Stable/Uncomplicated    Rehab Potential Excellent    PT Frequency 2x / week    PT Duration 8 weeks    PT Treatment/Interventions ADLs/Self Care Home Management;Cryotherapy;Electrical Stimulation;Ultrasound;Moist Heat;Therapeutic activities;Therapeutic exercise;Patient/family education;Passive range of motion;Vasopneumatic Device    PT Next Visit Plan Progress as symptoms dictate with light strengthening and AROM. 12 weeks post op as of 12/16/2019.    Consulted and Agree with Plan of Care Patient           Patient will benefit from skilled therapeutic intervention in order to improve the following deficits and impairments:  Pain,Decreased activity tolerance,Increased edema,Decreased range of motion  Visit Diagnosis: Acute pain of right shoulder  Stiffness of right shoulder, not elsewhere classified     Problem List Patient Active Problem List   Diagnosis Date Noted  . Sebaceous cyst   . Class 2 severe obesity with serious comorbidity and body mass index (BMI) of 35.0 to 35.9 in adult ([place]) 10/27/2017  . Other fatigue 09/10/2017  . Shortness of breath on exertion 09/10/2017  . Type 2 diabetes mellitus without complication, without long-term current use of insulin ([place]) 09/10/2017  . Essential hypertension 09/10/2017  . Other hyperlipidemia 09/10/2017  . Other specified hypothyroidism 09/10/2017  . Herniated cervical disc 08/16/2016    Standley Brooking, PTA 12/23/2019, 5:42 PM  [facility] [place], [place], [address] Phone: [phone]   Fax:  [phone]  Name: Crystal Roberts MRN: [id number] Date of Birth: 02-09-1955

## 2019-12-28 ENCOUNTER — Encounter: Payer: Self-pay | Admitting: Physical Therapy

## 2019-12-28 ENCOUNTER — Ambulatory Visit: Payer: 59 | Admitting: Physical Therapy

## 2019-12-28 ENCOUNTER — Other Ambulatory Visit: Payer: Self-pay

## 2019-12-28 DIAGNOSIS — M25511 Pain in right shoulder: Secondary | ICD-10-CM | POA: Diagnosis not present

## 2019-12-28 DIAGNOSIS — M25611 Stiffness of right shoulder, not elsewhere classified: Secondary | ICD-10-CM | POA: Diagnosis not present

## 2019-12-28 NOTE — Therapy (Signed)
[facility] [place], [place], [address] Phone: [phone]   Fax:  [phone]  Physical Therapy Treatment  Patient Details  Name: Crystal Roberts MRN: [id number] Date of Birth: 1955/11/08 Referring Provider (PT): Ophelia Charter MD   Encounter Date: 12/28/2019   PT End of Session - 12/28/19 1656    Visit Number 16    Number of Visits 16    Date for PT Re-Evaluation 12/29/19    Authorization Type FOTO.    PT Start Time 1650    PT Stop Time 1735    PT Time Calculation (min) 45 min    Activity Tolerance Patient tolerated treatment well    Behavior During Therapy WFL for tasks assessed/performed           Past Medical History:  Diagnosis Date  . Anxiety   . Arthritis    neck- cervical disc herniation   . Asthma    as child  . Back pain   . Cancer ([place])    skin - low back , insitu  melanoma   . Constipation   . COVID-19 09/07/2019   sx fever/cough/chills/lost taste and smell  . Depression   . Diabetes mellitus without complication ([place])   . Dyspnea   . Gallbladder problem   . GERD (gastroesophageal reflux disease)   . H/O cardiovascular stress test 1990   pt. reports that she was having a lot of stress & she was seen by cardiac- had stress test & told that it was negative   . Hyperlipidemia   . Hypertension   . Hypothyroidism   . Leg edema   . Pulmonary embolism ([place]) [place]. at [facility]- on blood thinning med., thought to be related to lack of activity, smoking & BCP    Past Surgical History:  Procedure Laterality Date  . ANTERIOR CERVICAL DECOMP/DISCECTOMY FUSION N/A 08/16/2016   Procedure: Cervical Four-Five Anterior cervical decompression/discectomy/fusion;  Surgeon: Erline Levine, MD;  Location: [facility];  Service: Neurosurgery;  Laterality: N/A;  C4-5 Anterior cervical decompression/discectomy/fusion  . BACK SURGERY     sebaceous cyst removed  . CHOLECYSTECTOMY  9390   umbilical hernia ------ followed  cholecystectomy  . Cyst removed from back    . CYSTECTOMY Bilateral    cyst on ovaries  . HERNIA REPAIR     umbilical hernia repair   . MASS EXCISION N/A 11/21/2017   Procedure: EXCISION SEBACEOUS CYST ON BACK;  Surgeon: Aviva Signs, MD;  Location: [facility];  Service: General;  Laterality: N/A;  . SHOULDER ARTHROSCOPY WITH ROTATOR CUFF REPAIR AND SUBACROMIAL DECOMPRESSION Right 09/23/2019   Procedure: RIGHT SHOULDER ARTHROSCOPY DEBRIDEMENT, ACROMIOPLASTY, DISTAL CLAVICAL EXCISION ROTATOR CUFF REPAIR;  Surgeon: Hiram Gash, MD;  Location: [facility];  Service: Orthopedics;  Laterality: Right;  . TUBAL LIGATION      There were no vitals filed for this visit.   Subjective Assessment - 12/28/19 1653    Subjective COVID 19 screening performed on patient upon arrival. Patient reports she reached in a weird way prior to leaving work and has had more pain. Has not been able to make contact with surgeon through patient portal but will call office tomorrow regarding ongoing pain. Using Tylenol and Aspercreme for pain control.    Pertinent History Hypothyroidism, DM, h/o back pain, hernia repair, SCDF, OA.    Patient Stated Goals Use right arm without difficulty.    Currently in Pain? Yes    Pain Score 5  Pain Location Shoulder    Pain Orientation Left;Anterior;Lateral    Pain Descriptors / Indicators Discomfort    Pain Type Surgical pain    Pain Onset More than a month ago    Pain Frequency Constant              [facility] PT Assessment - 12/28/19 0001      Assessment   Medical Diagnosis RT shld DCR, SAD, massive RTC repair.    Referring Provider (PT) Ophelia Charter MD    Onset Date/Surgical Date 09/23/19    Next MD Visit 01/06/2020      Precautions   Precaution Comments Follow protocol.                         [facility] Adult PT Treatment/Exercise - 12/28/19 0001      Shoulder Exercises: Supine   Protraction AROM;Left;10 reps    Protraction Limitations  fatigued      Shoulder Exercises: Pulleys   Flexion 5 minutes      Shoulder Exercises: ROM/Strengthening   UBE (Upper Arm Bike) 120 RPM x6 min (forward/backward)    [place] seated; flex/ext x3 min, circles x3 min    Other ROM/Strengthening Exercises wall ladder x6 reps (max at 22)      Modalities   Modalities Electrical Stimulation;Moist Heat      Moist Heat Therapy   Number Minutes Moist Heat 10 Minutes    Moist Heat Location Shoulder      Electrical Stimulation   Electrical Stimulation Location R shoulder    Electrical Stimulation Action Pre-Mod    Electrical Stimulation Parameters 80-150 hz x10 min    Electrical Stimulation Goals Pain      Manual Therapy   Manual Therapy Passive ROM    Passive ROM PROM of R shoulder into flex, ER with holds at end range                       PT Long Term Goals - 12/07/19 1017      PT LONG TERM GOAL #1   Title Independent with a HEP.    Time 8    Period Weeks    Status Achieved      PT LONG TERM GOAL #2   Title Active right shoulder flexion to 145 degrees so the patient can easily reach overhead.    Time 8    Period Weeks    Status On-going      PT LONG TERM GOAL #3   Title Active ER to 70 degrees+ to allow for easily donning/doffing of apparel.    Time 8      PT LONG TERM GOAL #4   Title Increase ROM so patient is able to reach behind back to L4.    Time 8    Period Weeks    Status On-going      PT LONG TERM GOAL #5   Title Increase shoulder strength to a solid 4 to 4+/5 to increase stability for performance of functional activities.    Time 8    Period Weeks    Status On-going      PT LONG TERM GOAL #6   Title Perform ADL's with pain not > 3/10.    Time 8    Period Weeks    Status On-going                 Plan - 12/28/19 1805    Clinical Impression Statement  Patient presented in clinic with reports of increased fatigue today but had a very busy day at work. Patient also reports increased pain  after reaching at ackward direction. Patient did report intermittant discomfort with facial grimacing as well. Firm end feels and smooth arc of motion noted during PROM session. Limited goal progression due to pain with AROM and pain. Normal modalities response noted following removal of the modalities.    Personal Factors and Comorbidities Comorbidity 1;Comorbidity 2    Comorbidities Hypothyroidism, DM, h/o back pain, hernia repair, SCDF, OA.    Examination-Activity Limitations Reach Overhead;Other    Examination-Participation Restrictions Other    Stability/Clinical Decision Making Stable/Uncomplicated    Rehab Potential Excellent    PT Frequency 2x / week    PT Duration 8 weeks    PT Treatment/Interventions ADLs/Self Care Home Management;Cryotherapy;Electrical Stimulation;Ultrasound;Moist Heat;Therapeutic activities;Therapeutic exercise;Patient/family education;Passive range of motion;Vasopneumatic Device    PT Next Visit Plan Progress as symptoms dictate with light strengthening and AROM. 12 weeks post op as of 12/16/2019.    Consulted and Agree with Plan of Care Patient           Patient will benefit from skilled therapeutic intervention in order to improve the following deficits and impairments:  Pain,Decreased activity tolerance,Increased edema,Decreased range of motion  Visit Diagnosis: Acute pain of right shoulder  Stiffness of right shoulder, not elsewhere classified     Problem List Patient Active Problem List   Diagnosis Date Noted  . Sebaceous cyst   . Class 2 severe obesity with serious comorbidity and body mass index (BMI) of 35.0 to 35.9 in adult ([place]) 10/27/2017  . Other fatigue 09/10/2017  . Shortness of breath on exertion 09/10/2017  . Type 2 diabetes mellitus without complication, without long-term current use of insulin ([place]) 09/10/2017  . Essential hypertension 09/10/2017  . Other hyperlipidemia 09/10/2017  . Other specified hypothyroidism 09/10/2017  .  Herniated cervical disc 08/16/2016    Standley Brooking, PTA 12/28/19 [facility] [place], [place], [address] Phone: [phone]   Fax:  [phone]  Name: Crystal Roberts MRN: [id number] Date of Birth: Jan 16, 1955

## 2019-12-30 ENCOUNTER — Encounter: Payer: Self-pay | Admitting: Physical Therapy

## 2019-12-30 ENCOUNTER — Other Ambulatory Visit: Payer: Self-pay

## 2019-12-30 ENCOUNTER — Ambulatory Visit: Payer: 59 | Admitting: Physical Therapy

## 2019-12-30 DIAGNOSIS — M25511 Pain in right shoulder: Secondary | ICD-10-CM

## 2019-12-30 DIAGNOSIS — M25611 Stiffness of right shoulder, not elsewhere classified: Secondary | ICD-10-CM

## 2019-12-30 NOTE — Therapy (Signed)
[facility] [place], [place], [address] Phone: [phone]   Fax:  [phone]  Physical Therapy Treatment  Patient Details  Name: Crystal Roberts MRN: [id number] Date of Birth: 1955-06-25 Referring Provider (PT): Ophelia Charter MD   Encounter Date: 12/30/2019   PT End of Session - 12/30/19 1701    Visit Number 17    Number of Visits 32    Date for PT Re-Evaluation 01/26/20   per NO signed by Dr. Griffin Basil   Authorization Type FOTO.    PT Start Time 1642    PT Stop Time 1731    PT Time Calculation (min) 49 min    Activity Tolerance Patient tolerated treatment well    Behavior During Therapy WFL for tasks assessed/performed           Past Medical History:  Diagnosis Date  . Anxiety   . Arthritis    neck- cervical disc herniation   . Asthma    as child  . Back pain   . Cancer ([place])    skin - low back , insitu  melanoma   . Constipation   . COVID-19 09/07/2019   sx fever/cough/chills/lost taste and smell  . Depression   . Diabetes mellitus without complication ([place])   . Dyspnea   . Gallbladder problem   . GERD (gastroesophageal reflux disease)   . H/O cardiovascular stress test 1990   pt. reports that she was having a lot of stress & she was seen by cardiac- had stress test & told that it was negative   . Hyperlipidemia   . Hypertension   . Hypothyroidism   . Leg edema   . Pulmonary embolism ([place]) [place]. at [facility]- on blood thinning med., thought to be related to lack of activity, smoking & BCP    Past Surgical History:  Procedure Laterality Date  . ANTERIOR CERVICAL DECOMP/DISCECTOMY FUSION N/A 08/16/2016   Procedure: Cervical Four-Five Anterior cervical decompression/discectomy/fusion;  Surgeon: Erline Levine, MD;  Location: [facility];  Service: Neurosurgery;  Laterality: N/A;  C4-5 Anterior cervical decompression/discectomy/fusion  . BACK SURGERY     sebaceous cyst removed  . CHOLECYSTECTOMY  7591    umbilical hernia ------ followed cholecystectomy  . Cyst removed from back    . CYSTECTOMY Bilateral    cyst on ovaries  . HERNIA REPAIR     umbilical hernia repair   . MASS EXCISION N/A 11/21/2017   Procedure: EXCISION SEBACEOUS CYST ON BACK;  Surgeon: Aviva Signs, MD;  Location: [facility];  Service: General;  Laterality: N/A;  . SHOULDER ARTHROSCOPY WITH ROTATOR CUFF REPAIR AND SUBACROMIAL DECOMPRESSION Right 09/23/2019   Procedure: RIGHT SHOULDER ARTHROSCOPY DEBRIDEMENT, ACROMIOPLASTY, DISTAL CLAVICAL EXCISION ROTATOR CUFF REPAIR;  Surgeon: Hiram Gash, MD;  Location: [facility];  Service: Orthopedics;  Laterality: Right;  . TUBAL LIGATION      There were no vitals filed for this visit.   Subjective Assessment - 12/30/19 1700    Subjective COVID 19 screening performed on patient upon arrival. Reports having a better day today.    Pertinent History Hypothyroidism, DM, h/o back pain, hernia repair, SCDF, OA.    Patient Stated Goals Use right arm without difficulty.    Currently in Pain? Other (Comment)   No pain assessment provided             [facility] PT Assessment - 12/30/19 0001      Assessment   Medical Diagnosis RT shld DCR,  SAD, massive RTC repair.    Referring Provider (PT) Ophelia Charter MD    Onset Date/Surgical Date 09/23/19    Next MD Visit 01/06/2020      Precautions   Precaution Comments Follow protocol.                         [facility] Adult PT Treatment/Exercise - 12/30/19 0001      Shoulder Exercises: Seated   Flexion AROM;Right;15 reps    Flexion Limitations short lever arm    Other Seated Exercises R bicep curl x20 reps 1#      Shoulder Exercises: Pulleys   Flexion 5 minutes      Shoulder Exercises: ROM/Strengthening   UBE (Upper Arm Bike) 120 RPM x4 min (forward/backward)      Shoulder Exercises: Isometric Strengthening   Flexion Limitations    Flexion Limitations 10x 5 sec    Extension Limitations    Extension Limitations  10x 5 sec    External Rotation Limitations    External Rotation Limitations 10x5 sec    Internal Rotation Limitations    Internal Rotation Limitations 10x 5 sec    ABduction Limitations    ABduction Limitations 10 x5 sec      Modalities   Modalities Electrical Stimulation;Moist Heat      Moist Heat Therapy   Number Minutes Moist Heat 10 Minutes    Moist Heat Location Shoulder      Electrical Stimulation   Electrical Stimulation Location R shoulder    Electrical Stimulation Action Pre-Mod    Electrical Stimulation Parameters 80-150 hz x10 min    Electrical Stimulation Goals Pain      Manual Therapy   Manual Therapy Passive ROM    Passive ROM PROM of R shoulder into flex, ER with holds at end range                  PT Education - 12/30/19 1749    Education Details HEP- isometric flex, abd, ER, IR, ext and AROM upper cut    Person(s) Educated Patient    Methods Explanation;Demonstration;Handout    Comprehension Verbalized understanding               PT Long Term Goals - 12/07/19 0807      PT LONG TERM GOAL #1   Title Independent with a HEP.    Time 8    Period Weeks    Status Achieved      PT LONG TERM GOAL #2   Title Active right shoulder flexion to 145 degrees so the patient can easily reach overhead.    Time 8    Period Weeks    Status On-going      PT LONG TERM GOAL #3   Title Active ER to 70 degrees+ to allow for easily donning/doffing of apparel.    Time 8      PT LONG TERM GOAL #4   Title Increase ROM so patient is able to reach behind back to L4.    Time 8    Period Weeks    Status On-going      PT LONG TERM GOAL #5   Title Increase shoulder strength to a solid 4 to 4+/5 to increase stability for performance of functional activities.    Time 8    Period Weeks    Status On-going      PT LONG TERM GOAL #6   Title Perform ADL's with pain not > 3/10.  Time 8    Period Weeks    Status On-going                 Plan -  12/30/19 1801    Clinical Impression Statement Patient presented in clinic with reports of having a better day with less R shoulder pain. Patient guided through isometric training and AROM exercises with less compensatory strategies of shoulder elevation. Patient provided HEP for isometric training and bicep training to progress strengthening. Patient verbalized understanding of instruction. Normal modalities response noted following removal of the modalities.    Personal Factors and Comorbidities Comorbidity 1;Comorbidity 2    Comorbidities Hypothyroidism, DM, h/o back pain, hernia repair, SCDF, OA.    Examination-Activity Limitations Reach Overhead;Other    Examination-Participation Restrictions Other    Stability/Clinical Decision Making Stable/Uncomplicated    Rehab Potential Excellent    PT Frequency 2x / week    PT Duration 8 weeks    PT Treatment/Interventions ADLs/Self Care Home Management;Cryotherapy;Electrical Stimulation;Ultrasound;Moist Heat;Therapeutic activities;Therapeutic exercise;Patient/family education;Passive range of motion;Vasopneumatic Device    PT Next Visit Plan Progress as symptoms dictate with light strengthening and AROM. 12 weeks post op as of 12/16/2019.    Consulted and Agree with Plan of Care Patient           Patient will benefit from skilled therapeutic intervention in order to improve the following deficits and impairments:  Pain,Decreased activity tolerance,Increased edema,Decreased range of motion  Visit Diagnosis: Acute pain of right shoulder  Stiffness of right shoulder, not elsewhere classified     Problem List Patient Active Problem List   Diagnosis Date Noted  . Sebaceous cyst   . Class 2 severe obesity with serious comorbidity and body mass index (BMI) of 35.0 to 35.9 in adult ([place]) 10/27/2017  . Other fatigue 09/10/2017  . Shortness of breath on exertion 09/10/2017  . Type 2 diabetes mellitus without complication, without long-term  current use of insulin ([place]) 09/10/2017  . Essential hypertension 09/10/2017  . Other hyperlipidemia 09/10/2017  . Other specified hypothyroidism 09/10/2017  . Herniated cervical disc 08/16/2016    Standley Brooking, PTA 12/30/2019, 6:11 PM  [facility] [place], [place], [address] Phone: [phone]   Fax:  [phone]  Name: Crystal Roberts MRN: [id number] Date of Birth: 1955/08/26

## 2020-01-04 ENCOUNTER — Ambulatory Visit: Payer: 59 | Admitting: Physical Therapy

## 2020-01-04 ENCOUNTER — Encounter: Payer: Self-pay | Admitting: Physical Therapy

## 2020-01-04 ENCOUNTER — Other Ambulatory Visit: Payer: Self-pay

## 2020-01-04 DIAGNOSIS — M25611 Stiffness of right shoulder, not elsewhere classified: Secondary | ICD-10-CM | POA: Diagnosis not present

## 2020-01-04 DIAGNOSIS — M25511 Pain in right shoulder: Secondary | ICD-10-CM | POA: Diagnosis not present

## 2020-01-04 NOTE — Therapy (Signed)
[facility] [place], [place], [address] Phone: [phone]   Fax:  [phone]  Physical Therapy Treatment  Patient Details  Name: Crystal Roberts MRN: [id number] Date of Birth: 14-Aug-1955 Referring Provider (PT): Ophelia Charter MD   Encounter Date: 01/04/2020   PT End of Session - 01/04/20 1646    Visit Number 18    Number of Visits 32    Date for PT Re-Evaluation 01/26/20    Authorization Type FOTO.    PT Start Time 1636    PT Stop Time 1721    PT Time Calculation (min) 45 min    Activity Tolerance Patient tolerated treatment well    Behavior During Therapy WFL for tasks assessed/performed           Past Medical History:  Diagnosis Date  . Anxiety   . Arthritis    neck- cervical disc herniation   . Asthma    as child  . Back pain   . Cancer ([place])    skin - low back , insitu  melanoma   . Constipation   . COVID-19 09/07/2019   sx fever/cough/chills/lost taste and smell  . Depression   . Diabetes mellitus without complication ([place])   . Dyspnea   . Gallbladder problem   . GERD (gastroesophageal reflux disease)   . H/O cardiovascular stress test 1990   pt. reports that she was having a lot of stress & she was seen by cardiac- had stress test & told that it was negative   . Hyperlipidemia   . Hypertension   . Hypothyroidism   . Leg edema   . Pulmonary embolism ([place]) [place]. at [facility]- on blood thinning med., thought to be related to lack of activity, smoking & BCP    Past Surgical History:  Procedure Laterality Date  . ANTERIOR CERVICAL DECOMP/DISCECTOMY FUSION N/A 08/16/2016   Procedure: Cervical Four-Five Anterior cervical decompression/discectomy/fusion;  Surgeon: Erline Levine, MD;  Location: [facility];  Service: Neurosurgery;  Laterality: N/A;  C4-5 Anterior cervical decompression/discectomy/fusion  . BACK SURGERY     sebaceous cyst removed  . CHOLECYSTECTOMY  3614   umbilical hernia ------ followed  cholecystectomy  . Cyst removed from back    . CYSTECTOMY Bilateral    cyst on ovaries  . HERNIA REPAIR     umbilical hernia repair   . MASS EXCISION N/A 11/21/2017   Procedure: EXCISION SEBACEOUS CYST ON BACK;  Surgeon: Aviva Signs, MD;  Location: [facility];  Service: General;  Laterality: N/A;  . SHOULDER ARTHROSCOPY WITH ROTATOR CUFF REPAIR AND SUBACROMIAL DECOMPRESSION Right 09/23/2019   Procedure: RIGHT SHOULDER ARTHROSCOPY DEBRIDEMENT, ACROMIOPLASTY, DISTAL CLAVICAL EXCISION ROTATOR CUFF REPAIR;  Surgeon: Hiram Gash, MD;  Location: [facility];  Service: Orthopedics;  Laterality: Right;  . TUBAL LIGATION      There were no vitals filed for this visit.   Subjective Assessment - 01/04/20 1637    Subjective COVID 19 screening performed on patient upon arrival. 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    Pertinent History Hypothyroidism, DM, h/o back pain, hernia repair, SCDF, OA.    Patient Stated Goals Use right arm without difficulty.    Currently in Pain? Yes    Pain Score 2     Pain Location Shoulder    Pain Orientation Left    Pain Descriptors / Indicators Sore    Pain Type  Surgical pain    Pain Onset More than a month ago    Pain Frequency Intermittent              [facility] PT Assessment - 01/04/20 0001      Assessment   Medical Diagnosis RT shld DCR, SAD, massive RTC repair.    Referring Provider (PT) Ramond Marrow MD    Onset Date/Surgical Date 09/23/19    Next MD Visit 01/06/2020      Precautions   Precaution Comments Follow protocol.      ROM / Strength   AROM / PROM / Strength AROM      AROM   Overall AROM  Deficits    AROM Assessment Site Shoulder    Right/Left Shoulder Right    Right Shoulder Flexion 70 Degrees                         [facility] Adult PT Treatment/Exercise - 01/04/20 0001      Shoulder Exercises: Seated   Flexion AROM;Right;15 reps    Flexion Limitations  short lever arm    Abduction AROM;Right;20 reps    ABduction Limitations short lever arm    Other Seated Exercises R bicep curl x20 reps 1#      Shoulder Exercises: Prone   Retraction Strengthening;Right;20 reps;10 reps;Weights    Retraction Weight (lbs) 1    Extension Strengthening;Right;Weights    Extension Weight (lbs) 1      Shoulder Exercises: Pulleys   Flexion 5 minutes      Shoulder Exercises: ROM/Strengthening   UBE (Upper Arm Bike) 120 RPM x6 min (forward/backward)    Wall Wash CW, CCW xfatigue    Other ROM/Strengthening Exercises wall ladder x10 reps (max at 27)      Shoulder Exercises: Isometric Strengthening   Flexion Limitations    Flexion Limitations 10x 5 sec    Extension Limitations    Extension Limitations 10x 5 sec    External Rotation Limitations    External Rotation Limitations 10x5 sec    Internal Rotation Limitations    Internal Rotation Limitations 10x 5 sec    ABduction Limitations    ABduction Limitations 10 x5 sec      Modalities   Modalities Electrical Stimulation;Moist Heat      Moist Heat Therapy   Number Minutes Moist Heat 10 Minutes    Moist Heat Location Shoulder      Electrical Stimulation   Electrical Stimulation Location R shoulder    Electrical Stimulation Action Pre-Mod    Electrical Stimulation Parameters 80-150 hz x10 min    Electrical Stimulation Goals Pain                       PT Long Term Goals - 12/07/19 0807      PT LONG TERM GOAL #1   Title Independent with a HEP.    Time 8    Period Weeks    Status Achieved      PT LONG TERM GOAL #2   Title Active right shoulder flexion to 145 degrees so the patient can easily reach overhead.    Time 8    Period Weeks    Status On-going      PT LONG TERM GOAL #3   Title Active ER to 70 degrees+ to allow for easily donning/doffing of apparel.    Time 8      PT LONG TERM GOAL #4   Title Increase ROM  so patient is able to reach behind back to L4.    Time 8     Period Weeks    Status On-going      PT LONG TERM GOAL #5   Title Increase shoulder strength to a solid 4 to 4+/5 to increase stability for performance of functional activities.    Time 8    Period Weeks    Status On-going      PT LONG TERM GOAL #6   Title Perform ADL's with pain not > 3/10.    Time 8    Period Weeks    Status On-going                 Plan - 01/04/20 1741    Clinical Impression Statement Patient presented in clinic with reports of minimal R shoulder pain. Patient progressed through light strengthening and AROM exercises with reports of only fatigue. Patient limited with AROM flexion to 70 deg. Patient compliant with HEP completion at this time for light strengthening for functional activities. Normal stimulation response noted following removal of the modality.    Personal Factors and Comorbidities Comorbidity 1;Comorbidity 2    Comorbidities Hypothyroidism, DM, h/o back pain, hernia repair, SCDF, OA.    Examination-Activity Limitations Reach Overhead;Other    Examination-Participation Restrictions Other    Stability/Clinical Decision Making Stable/Uncomplicated    Rehab Potential Excellent    PT Frequency 2x / week    PT Duration 8 weeks    PT Treatment/Interventions ADLs/Self Care Home Management;Cryotherapy;Electrical Stimulation;Ultrasound;Moist Heat;Therapeutic activities;Therapeutic exercise;Patient/family education;Passive range of motion;Vasopneumatic Device    PT Next Visit Plan Progress as symptoms dictate with light strengthening and AROM. 12 weeks post op as of 12/16/2019.    Consulted and Agree with Plan of Care Patient           Patient will benefit from skilled therapeutic intervention in order to improve the following deficits and impairments:  Pain,Decreased activity tolerance,Increased edema,Decreased range of motion  Visit Diagnosis: Acute pain of right shoulder  Stiffness of right shoulder, not elsewhere classified     Problem  List Patient Active Problem List   Diagnosis Date Noted  . Sebaceous cyst   . Class 2 severe obesity with serious comorbidity and body mass index (BMI) of 35.0 to 35.9 in adult ([place]) 10/27/2017  . Other fatigue 09/10/2017  . Shortness of breath on exertion 09/10/2017  . Type 2 diabetes mellitus without complication, without long-term current use of insulin ([place]) 09/10/2017  . Essential hypertension 09/10/2017  . Other hyperlipidemia 09/10/2017  . Other specified hypothyroidism 09/10/2017  . Herniated cervical disc 08/16/2016   Standley Brooking, PTA 01/04/20 5:52 PM  [facility] [place], [place], [address] Phone: [phone]   Fax:  [phone]  Name: Crystal Roberts MRN: [id number] Date of Birth: 1955-03-07

## 2020-01-05 ENCOUNTER — Encounter (INDEPENDENT_AMBULATORY_CARE_PROVIDER_SITE_OTHER): Payer: Self-pay | Admitting: Bariatrics

## 2020-01-05 ENCOUNTER — Ambulatory Visit (INDEPENDENT_AMBULATORY_CARE_PROVIDER_SITE_OTHER): Payer: 59 | Admitting: Bariatrics

## 2020-01-05 VITALS — BP 168/89 | HR 63 | Temp 98.2°F | Ht 64.0 in | Wt 241.0 lb

## 2020-01-05 DIAGNOSIS — E038 Other specified hypothyroidism: Secondary | ICD-10-CM | POA: Diagnosis not present

## 2020-01-05 DIAGNOSIS — E559 Vitamin D deficiency, unspecified: Secondary | ICD-10-CM

## 2020-01-05 DIAGNOSIS — E785 Hyperlipidemia, unspecified: Secondary | ICD-10-CM

## 2020-01-05 DIAGNOSIS — Z9189 Other specified personal risk factors, not elsewhere classified: Secondary | ICD-10-CM

## 2020-01-05 DIAGNOSIS — E669 Obesity, unspecified: Secondary | ICD-10-CM

## 2020-01-05 DIAGNOSIS — Z6841 Body Mass Index (BMI) 40.0 and over, adult: Secondary | ICD-10-CM | POA: Diagnosis not present

## 2020-01-05 DIAGNOSIS — E1169 Type 2 diabetes mellitus with other specified complication: Secondary | ICD-10-CM | POA: Diagnosis not present

## 2020-01-05 NOTE — Progress Notes (Signed)
Chief Complaint:   OBESITY Crystal Roberts is here to discuss her progress with her obesity treatment plan along with follow-up of her obesity related diagnoses. [name] is on the Category 2 Plan and states she is following her eating plan approximately 20% of the time. [name] states she has increased her walking for exercise.  Today's visit was #: 15 Starting weight: 241 lbs Starting date: 09/10/2017 Today's weight: 241 lbs Today's date: 01/05/2020 Total lbs lost to date: 0 Total lbs lost since last in-office visit: 0  Interim History: [name]'s weight remains the same; she has stabilized.  Subjective:   Diabetes mellitus type 2 in obese (Crystal Roberts). Haydee states blood sugars are doing well. A1c 5.7.  Lab Results  Component Value Date   HGBA1C 6.0 (H) 11/18/2017   HGBA1C 6.1 (H) 09/10/2017   HGBA1C 5.8 (H) 08/13/2016   Lab Results  Component Value Date   LDLCALC 93 09/10/2017   CREATININE 0.95 09/21/2019   Lab Results  Component Value Date   INSULIN 27.5 (H) 09/10/2017   Hyperlipidemia associated with type 2 diabetes mellitus (Crystal Roberts). [name] is taking Zocor.  Lab Results  Component Value Date   CHOL 171 09/10/2017   HDL 57 09/10/2017   LDLCALC 93 09/10/2017   TRIG 105 09/10/2017   Lab Results  Component Value Date   ALT 26 09/10/2017   AST 25 09/10/2017   ALKPHOS 82 09/10/2017   BILITOT 0.3 09/10/2017   The 10-year ASCVD risk score Mikey Bussing DC Jr., et al., 2013) is: 15.2%   Values used to calculate the score:     Age: 64 years     Sex: Female     Is Non-Hispanic African American: No     Diabetic: Yes     Tobacco smoker: No     Systolic Blood Pressure: 948 mmHg     Is BP treated: Yes     HDL Cholesterol: 57 mg/dL     Total Cholesterol: 171 mg/dL  Vitamin D deficiency. Crystal Roberts is taking a multivitamin.    Ref. Range 09/10/2017 09:23  Vitamin D, 25-Hydroxy Latest Ref Range: 30.0 - 100.0 ng/mL 46.3   Other specified hypothyroidism. Crystal Roberts is taking Synthroid. She  reports fatigue is improved.   Lab Results  Component Value Date   TSH 3.390 09/10/2017   At risk for heart disease. Crystal Roberts is at a higher than average risk for cardiovascular disease due to hyperlipidemia.   Assessment/Plan:   Diabetes mellitus type 2 in obese ([name]). Good blood sugar control is important to decrease the likelihood of diabetic complications such as nephropathy, neuropathy, limb loss, blindness, coronary artery disease, and death. Intensive lifestyle modification including diet, exercise and weight loss are the first line of treatment for diabetes.  Microalbumin / creatinine urine ratio, Insulin, random, Hemoglobin A1c labs will be checked today.   Hyperlipidemia associated with type 2 diabetes mellitus ([name]). Cardiovascular risk and specific lipid/LDL goals reviewed.  We discussed several lifestyle modifications today and Crystal Roberts will continue to work on diet, exercise and weight loss efforts. Orders and follow up as documented in patient record. Comprehensive metabolic panel, Lipid Panel With LDL/HDL Ratio labs will be checked today.   Counseling Intensive lifestyle modifications are the first line treatment for this issue. . Dietary changes: Increase soluble fiber. Decrease simple carbohydrates. . Exercise changes: Moderate to vigorous-intensity aerobic activity 150 minutes per week if tolerated. . Lipid-lowering medications: see documented in medical record.   Vitamin D deficiency. Low Vitamin D level  contributes to fatigue and are associated with obesity, breast, and colon cancer. VITAMIN D 25 Hydroxy (Vit-D Deficiency, Fractures) level will be checked today.   Other specified hypothyroidism. Patient with long-standing hypothyroidism, on levothyroxine therapy. She appears euthyroid. Orders and follow up as documented in patient record. Crystal Roberts will continue medication as directed. TSH+T4F+T3Free labs will be checked today.   Counseling . Good thyroid control is important for  overall health. Supratherapeutic thyroid levels are dangerous and will not improve weight loss results. . The correct way to take levothyroxine is fasting, with water, separated by at least 30 minutes from breakfast, and separated by more than 4 hours from calcium, iron, multivitamins, acid reflux medications (PPIs).    At risk for heart disease. Crystal Roberts was given approximately 15 minutes of coronary artery disease prevention counseling today. She is 64 y.o. female and has risk factors for heart disease including obesity. We discussed intensive lifestyle modifications today with an emphasis on specific weight loss instructions and strategies.   Repetitive spaced learning was employed today to elicit superior memory formation and behavioral change.  Class 3 severe obesity with serious comorbidity and body mass index (BMI) of 40.0 to 44.9 in adult, unspecified obesity type ([place]).  Crystal Roberts is currently in the action stage of change. As such, her goal is to continue with weight loss efforts. She has agreed to the Category 2 Plan.   She will work on meal planning and intentional eating.   Exercise goals: Crystal Roberts will continue to increase walking.  Behavioral modification strategies: increasing lean protein intake, decreasing simple carbohydrates, increasing vegetables, increasing water intake, decreasing eating out, no skipping meals, meal planning and cooking strategies, keeping healthy foods in the home, dealing with family or coworker sabotage, travel eating strategies, holiday eating strategies , celebration eating strategies, avoiding temptations and planning for success.  Crystal Roberts has agreed to follow-up with our clinic in 4 weeks. She was informed of the importance of frequent follow-up visits to maximize her success with intensive lifestyle modifications for her multiple health conditions.   Crystal Roberts was informed we would discuss her lab results at her next visit unless there is a critical issue that needs  to be addressed sooner. Crystal Roberts agreed to keep her next visit at the agreed upon time to discuss these results.  Objective:   Blood pressure (!) 168/89, pulse 63, temperature 98.2 F (36.8 C), temperature source Oral, height 5\' 4"  (1.626 m), weight 241 lb (109.3 kg), SpO2 95 %. Body mass index is 41.37 kg/m.  General: Cooperative, alert, well developed, in no acute distress. HEENT: Conjunctivae and lids unremarkable. Cardiovascular: Regular rhythm.  Lungs: Normal work of breathing. Neurologic: No focal deficits.   Lab Results  Component Value Date   CREATININE 0.95 09/21/2019   BUN 23 09/21/2019   NA 140 09/21/2019   K 3.7 09/21/2019   CL 105 09/21/2019   CO2 26 09/21/2019   Lab Results  Component Value Date   ALT 26 09/10/2017   AST 25 09/10/2017   ALKPHOS 82 09/10/2017   BILITOT 0.3 09/10/2017   Lab Results  Component Value Date   HGBA1C 6.0 (H) 11/18/2017   HGBA1C 6.1 (H) 09/10/2017   HGBA1C 5.8 (H) 08/13/2016   Lab Results  Component Value Date   INSULIN 27.5 (H) 09/10/2017   Lab Results  Component Value Date   TSH 3.390 09/10/2017   Lab Results  Component Value Date   CHOL 171 09/10/2017   HDL 57 09/10/2017   LDLCALC 93 09/10/2017  TRIG 105 09/10/2017   Lab Results  Component Value Date   WBC 8.6 11/18/2017   HGB 13.9 11/18/2017   HCT 43.8 11/18/2017   MCV 93.8 11/18/2017   PLT 361 11/18/2017   No results found for: IRON, TIBC, FERRITIN  Attestation Statements:   Reviewed by clinician on day of visit: allergies, medications, problem list, medical history, surgical history, family history, social history, and previous encounter notes.  Migdalia Dk, am acting as Location manager for [employer], DO   I have reviewed the above documentation for accuracy and completeness, and I agree with the above. Jearld Lesch, DO

## 2020-01-06 DIAGNOSIS — M25531 Pain in right wrist: Secondary | ICD-10-CM | POA: Diagnosis not present

## 2020-01-06 LAB — MICROALBUMIN / CREATININE URINE RATIO
Creatinine, Urine: 50 mg/dL
Microalb/Creat Ratio: 18 mg/g creat (ref 0–29)
Microalbumin, Urine: 9 ug/mL

## 2020-01-06 LAB — INSULIN, RANDOM: INSULIN: 16.8 u[IU]/mL (ref 2.6–24.9)

## 2020-01-06 LAB — LIPID PANEL WITH LDL/HDL RATIO
Cholesterol, Total: 196 mg/dL (ref 100–199)
HDL: 67 mg/dL (ref >39)
LDL Chol Calc (NIH): 109 mg/dL — ABNORMAL HIGH (ref 0–99)
LDL/HDL Ratio: 1.6 ratio (ref 0.0–3.2)
Triglycerides: 113 mg/dL (ref 0–149)
VLDL Cholesterol Cal: 20 mg/dL (ref 5–40)

## 2020-01-06 LAB — COMPREHENSIVE METABOLIC PANEL
ALT: 22 IU/L (ref 0–32)
AST: 24 IU/L (ref 0–40)
Albumin/Globulin Ratio: 1.6 (ref 1.2–2.2)
Albumin: 4.3 g/dL (ref 3.8–4.8)
Alkaline Phosphatase: 95 IU/L (ref 44–121)
BUN/Creatinine Ratio: 14 (ref 12–28)
BUN: 12 mg/dL (ref 8–27)
Bilirubin Total: 0.2 mg/dL (ref 0.0–1.2)
CO2: 24 mmol/L (ref 20–29)
Calcium: 9.5 mg/dL (ref 8.7–10.3)
Chloride: 102 mmol/L (ref 96–106)
Creatinine, Ser: 0.83 mg/dL (ref 0.57–1.00)
GFR calc Af Amer: 87 mL/min/{1.73_m2} (ref >59)
GFR calc non Af Amer: 76 mL/min/{1.73_m2} (ref >59)
Globulin, Total: 2.7 g/dL (ref 1.5–4.5)
Glucose: 95 mg/dL (ref 65–99)
Potassium: 4.6 mmol/L (ref 3.5–5.2)
Sodium: 141 mmol/L (ref 134–144)
Total Protein: 7 g/dL (ref 6.0–8.5)

## 2020-01-06 LAB — TSH+T4F+T3FREE
Free T4: 1.15 ng/dL (ref 0.82–1.77)
T3, Free: 2.9 pg/mL (ref 2.0–4.4)
TSH: 1.08 u[IU]/mL (ref 0.450–4.500)

## 2020-01-06 LAB — VITAMIN D 25 HYDROXY (VIT D DEFICIENCY, FRACTURES): Vit D, 25-Hydroxy: 43.8 ng/mL (ref 30.0–100.0)

## 2020-01-07 ENCOUNTER — Encounter (INDEPENDENT_AMBULATORY_CARE_PROVIDER_SITE_OTHER): Payer: Self-pay | Admitting: Bariatrics

## 2020-01-11 ENCOUNTER — Encounter: Payer: Self-pay | Admitting: Physical Therapy

## 2020-01-11 ENCOUNTER — Ambulatory Visit: Payer: 59 | Admitting: Physical Therapy

## 2020-01-11 ENCOUNTER — Other Ambulatory Visit: Payer: Self-pay

## 2020-01-11 DIAGNOSIS — M25511 Pain in right shoulder: Secondary | ICD-10-CM

## 2020-01-11 DIAGNOSIS — M25611 Stiffness of right shoulder, not elsewhere classified: Secondary | ICD-10-CM

## 2020-01-11 NOTE — Therapy (Signed)
[facility] [address] [place], [place], [address] Phone: [phone]   Fax:  [phone]  Physical Therapy Treatment  Patient Details  Name: Crystal Roberts MRN: [id number] Date of Birth: 1955-05-17 Referring Provider (PT): Ramond Marrow MD   Encounter Date: 01/11/2020   PT End of Session - 01/11/20 1638    Visit Number 19    Number of Visits 32    Date for PT Re-Evaluation 01/26/20    Authorization Type FOTO.    PT Start Time 1636    PT Stop Time 1719    PT Time Calculation (min) 43 min    Activity Tolerance Patient tolerated treatment well    Behavior During Therapy WFL for tasks assessed/performed           Past Medical History:  Diagnosis Date  . Anxiety   . Arthritis    neck- cervical disc herniation   . Asthma    as child  . Back pain   . Cancer (HCC)    skin - low back , insitu  melanoma   . Constipation   . COVID-19 09/07/2019   sx fever/cough/chills/lost taste and smell  . Depression   . Diabetes mellitus without complication (HCC)   . Dyspnea   . Gallbladder problem   . GERD (gastroesophageal reflux disease)   . H/O cardiovascular stress test 1990   pt. reports that she was having a lot of stress & she was seen by cardiac- had stress test & told that it was negative   . Hyperlipidemia   . Hypertension   . Hypothyroidism   . Leg edema   . Pulmonary embolism (HCC) 1986   Hosp. at [facility]- on blood thinning med., thought to be related to lack of activity, smoking & BCP    Past Surgical History:  Procedure Laterality Date  . ANTERIOR CERVICAL DECOMP/DISCECTOMY FUSION N/A 08/16/2016   Procedure: Cervical Four-Five Anterior cervical decompression/discectomy/fusion;  Surgeon: Maeola Harman, MD;  Location: [facility] OR;  Service: Neurosurgery;  Laterality: N/A;  C4-5 Anterior cervical decompression/discectomy/fusion  . BACK SURGERY     sebaceous cyst removed  . CHOLECYSTECTOMY  1987   umbilical hernia ------ followed  cholecystectomy  . Cyst removed from back    . CYSTECTOMY Bilateral    cyst on ovaries  . HERNIA REPAIR     umbilical hernia repair   . MASS EXCISION N/A 11/21/2017   Procedure: EXCISION SEBACEOUS CYST ON BACK;  Surgeon: Franky Macho, MD;  Location: [facility];  Service: General;  Laterality: N/A;  . SHOULDER ARTHROSCOPY WITH ROTATOR CUFF REPAIR AND SUBACROMIAL DECOMPRESSION Right 09/23/2019   Procedure: RIGHT SHOULDER ARTHROSCOPY DEBRIDEMENT, ACROMIOPLASTY, DISTAL CLAVICAL EXCISION ROTATOR CUFF REPAIR;  Surgeon: Bjorn Pippin, MD;  Location: [facility];  Service: Orthopedics;  Laterality: Right;  . TUBAL LIGATION      There were no vitals filed for this visit.   Subjective Assessment - 01/11/20 1637    Subjective COVID 19 screening performed on patient upon arrival. Reports she is to return to MD in two months. MD was okay with progress and has ordered her a TENS unit.    Pertinent History Hypothyroidism, DM, h/o back pain, hernia repair, SCDF, OA.    Patient Stated Goals Use right arm without difficulty.    Currently in Pain? No/denies              [facility] PT Assessment - 01/11/20 0001      Assessment   Medical Diagnosis  RT shld DCR, SAD, massive RTC repair.    Referring Provider (PT) Ramond Marrow MD    Onset Date/Surgical Date 09/23/19    Next MD Visit 02/2020      Precautions   Precaution Comments Follow protocol.      Restrictions   Other Position/Activity Restrictions No right UE weight bearing.                         [facility] Adult PT Treatment/Exercise - 01/11/20 0001      Shoulder Exercises: Prone   Retraction Strengthening;Right;20 reps;10 reps    Extension Strengthening;Right;20 reps      Shoulder Exercises: Sidelying   External Rotation Strengthening;Right;20 reps    Flexion Strengthening;Right;20 reps      Shoulder Exercises: Standing   Flexion AAROM;Right;15 reps    ABduction AAROM;Right;20 reps    Other Standing Exercises wall  ladder slides x6 reps   limited by fatigue     Shoulder Exercises: Pulleys   Flexion 3 minutes      Shoulder Exercises: ROM/Strengthening   UBE (Upper Arm Bike) 90 RPM x8 min (forward/backward)      Shoulder Exercises: Isometric Strengthening   Flexion Limitations    Flexion Limitations 10x 5 sec    Extension Limitations    Extension Limitations 10x 5 sec    External Rotation Limitations    External Rotation Limitations 10x5 sec    Internal Rotation Limitations    Internal Rotation Limitations 10x 5 sec    ABduction Limitations    ABduction Limitations 10 x5 sec      Modalities   Modalities Electrical Stimulation;Moist Heat      Moist Heat Therapy   Number Minutes Moist Heat 10 Minutes    Moist Heat Location Shoulder      Electrical Stimulation   Electrical Stimulation Location R shoulder    Electrical Stimulation Action Pre-Mod    Electrical Stimulation Parameters 80-150 hz x10 min    Electrical Stimulation Goals Pain                       PT Long Term Goals - 12/07/19 0807      PT LONG TERM GOAL #1   Title Independent with a HEP.    Time 8    Period Weeks    Status Achieved      PT LONG TERM GOAL #2   Title Active right shoulder flexion to 145 degrees so the patient can easily reach overhead.    Time 8    Period Weeks    Status On-going      PT LONG TERM GOAL #3   Title Active ER to 70 degrees+ to allow for easily donning/doffing of apparel.    Time 8      PT LONG TERM GOAL #4   Title Increase ROM so patient is able to reach behind back to L4.    Time 8    Period Weeks    Status On-going      PT LONG TERM GOAL #5   Title Increase shoulder strength to a solid 4 to 4+/5 to increase stability for performance of functional activities.    Time 8    Period Weeks    Status On-going      PT LONG TERM GOAL #6   Title Perform ADL's with pain not > 3/10.    Time 8    Period Weeks    Status On-going  Plan - 01/11/20  1712    Clinical Impression Statement Patient presented in clinic with reports of only minimal R shoulder pain upon waking. Patient able to continue tolerating light strengthening well with no complaints of muscle fatigue. Patient still limited with standing flexion and abduction due to muscle weakness. SL AROM flexion was observed WNL. Normal modalities response noted following removal of the modalities secondary to post therex fatigue.    Personal Factors and Comorbidities Comorbidity 1;Comorbidity 2    Comorbidities Hypothyroidism, DM, h/o back pain, hernia repair, SCDF, OA.    Examination-Activity Limitations Reach Overhead;Other    Examination-Participation Restrictions Other    Stability/Clinical Decision Making Stable/Uncomplicated    Rehab Potential Excellent    PT Frequency 2x / week    PT Duration 8 weeks    PT Treatment/Interventions ADLs/Self Care Home Management;Cryotherapy;Electrical Stimulation;Ultrasound;Moist Heat;Therapeutic activities;Therapeutic exercise;Patient/family education;Passive range of motion;Vasopneumatic Device    PT Next Visit Plan Progress as symptoms dictate with light strengthening and AROM. 12 weeks post op as of 12/16/2019.    Consulted and Agree with Plan of Care Patient           Patient will benefit from skilled therapeutic intervention in order to improve the following deficits and impairments:  Pain,Decreased activity tolerance,Increased edema,Decreased range of motion  Visit Diagnosis: Acute pain of right shoulder  Stiffness of right shoulder, not elsewhere classified     Problem List Patient Active Problem List   Diagnosis Date Noted  . Sebaceous cyst   . Class 2 severe obesity with serious comorbidity and body mass index (BMI) of 35.0 to 35.9 in adult ([place]) 10/27/2017  . Other fatigue 09/10/2017  . Shortness of breath on exertion 09/10/2017  . Type 2 diabetes mellitus without complication, without long-term current use of insulin ([place])  09/10/2017  . Essential hypertension 09/10/2017  . Other hyperlipidemia 09/10/2017  . Other specified hypothyroidism 09/10/2017  . Herniated cervical disc 08/16/2016    Standley Brooking, PTA 01/11/2020, 5:33 PM  [facility] [address] [place], [place], [address] Phone: [phone]   Fax:  [phone]  Name: Crystal Roberts MRN: [id number] Date of Birth: 04-Dec-1955

## 2020-01-18 ENCOUNTER — Ambulatory Visit: Payer: 59 | Admitting: Physical Therapy

## 2020-01-20 ENCOUNTER — Other Ambulatory Visit: Payer: Self-pay

## 2020-01-20 ENCOUNTER — Ambulatory Visit: Payer: 59 | Attending: Orthopaedic Surgery | Admitting: *Deleted

## 2020-01-20 DIAGNOSIS — M25611 Stiffness of right shoulder, not elsewhere classified: Secondary | ICD-10-CM | POA: Diagnosis not present

## 2020-01-20 DIAGNOSIS — M25511 Pain in right shoulder: Secondary | ICD-10-CM | POA: Diagnosis not present

## 2020-01-20 NOTE — Therapy (Signed)
[facility] [address] [place], [place], [address] Phone: [phone]   Fax:  [phone]  Physical Therapy Treatment  Patient Details  Name: Crystal Roberts MRN: [id number] Date of Birth: [birth year] Referring Provider (PT): Ramond Marrow MD   Encounter Date: 01/20/2020   PT End of Session - 01/20/20 1750    Visit Number 20    Number of Visits 32    Date for PT Re-Evaluation 01/26/20    Authorization Type FOTO.    PT Start Time 1645    PT Stop Time 1736    PT Time Calculation (min) 51 min           Past Medical History:  Diagnosis Date  . Anxiety   . Arthritis    neck- cervical disc herniation   . Asthma    as child  . Back pain   . Cancer (HCC)    skin - low back , insitu  melanoma   . Constipation   . COVID-19 09/07/2019   sx fever/cough/chills/lost taste and smell  . Depression   . Diabetes mellitus without complication (HCC)   . Dyspnea   . Gallbladder problem   . GERD (gastroesophageal reflux disease)   . H/O cardiovascular stress test 1990   pt. reports that she was having a lot of stress & she was seen by cardiac- had stress test & told that it was negative   . Hyperlipidemia   . Hypertension   . Hypothyroidism   . Leg edema   . Pulmonary embolism (HCC) 1986   Hosp. at [facility]- on blood thinning med., thought to be related to lack of activity, smoking & BCP    Past Surgical History:  Procedure Laterality Date  . ANTERIOR CERVICAL DECOMP/DISCECTOMY FUSION N/A 08/16/2016   Procedure: Cervical Four-Five Anterior cervical decompression/discectomy/fusion;  Surgeon: Maeola Harman, MD;  Location: [facility] OR;  Service: Neurosurgery;  Laterality: N/A;  C4-5 Anterior cervical decompression/discectomy/fusion  . BACK SURGERY     sebaceous cyst removed  . CHOLECYSTECTOMY  1987   umbilical hernia ------ followed cholecystectomy  . Cyst removed from back    . CYSTECTOMY Bilateral    cyst on ovaries  . HERNIA REPAIR     umbilical  hernia repair   . MASS EXCISION N/A 11/21/2017   Procedure: EXCISION SEBACEOUS CYST ON BACK;  Surgeon: Franky Macho, MD;  Location: [facility];  Service: General;  Laterality: N/A;  . SHOULDER ARTHROSCOPY WITH ROTATOR CUFF REPAIR AND SUBACROMIAL DECOMPRESSION Right 09/23/2019   Procedure: RIGHT SHOULDER ARTHROSCOPY DEBRIDEMENT, ACROMIOPLASTY, DISTAL CLAVICAL EXCISION ROTATOR CUFF REPAIR;  Surgeon: Bjorn Pippin, MD;  Location: [facility];  Service: Orthopedics;  Laterality: Right;  . TUBAL LIGATION      There were no vitals filed for this visit.                      [facility] Adult PT Treatment/Exercise - 01/20/20 0001      Exercises   Exercises Shoulder;Elbow      Elbow Exercises   Elbow Flexion Strengthening;Right;20 reps;Bar weights/barbell   2x20   Elbow Extension Strengthening;Both;20 reps    Theraband Level (Elbow Extension) --   green XTS     Shoulder Exercises: Seated   Extension --   2x20 Green XTS     Shoulder Exercises: Prone   Retraction Strengthening;Right;20 reps   2x20   Retraction Weight (lbs) 2      Shoulder Exercises: Sidelying   External  Rotation AROM;10 reps;Strengthening    External Rotation Weight (lbs) 1    Flexion AROM;Right;10 reps;20 reps      Shoulder Exercises: Standing   Flexion AAROM   3x10 with manual assist   Extension Strengthening;Both;20 reps;Theraband   XTS green 2x20     Shoulder Exercises: Pulleys   Flexion 3 minutes      Shoulder Exercises: ROM/Strengthening   UBE (Upper Arm Bike) 90 RPM x6 min (forward/backward)      Modalities   Modalities Electrical Stimulation;Moist Heat      Moist Heat Therapy   Number Minutes Moist Heat 10 Minutes    Moist Heat Location Shoulder      Electrical Stimulation   Electrical Stimulation Location R shoulder    Electrical Stimulation Action premod    Electrical Stimulation Parameters 80-150 hz x 10 mins    Electrical Stimulation Goals Pain      Manual Therapy   Manual  therapy comments AAROM RT UE flexion FROM with holds at top x 2-3 secs                       PT Long Term Goals - 12/07/19 [id number]      PT LONG TERM GOAL #1   Title Independent with a HEP.    Time 8    Period Weeks    Status Achieved      PT LONG TERM GOAL #2   Title Active right shoulder flexion to 145 degrees so the patient can easily reach overhead.    Time 8    Period Weeks    Status On-going      PT LONG TERM GOAL #3   Title Active ER to 70 degrees+ to allow for easily donning/doffing of apparel.    Time 8      PT LONG TERM GOAL #4   Title Increase ROM so patient is able to reach behind back to L4.    Time 8    Period Weeks    Status On-going      PT LONG TERM GOAL #5   Title Increase shoulder strength to a solid 4 to 4+/5 to increase stability for performance of functional activities.    Time 8    Period Weeks    Status On-going      PT LONG TERM GOAL #6   Title Perform ADL's with pain not > 3/10.    Time 8    Period Weeks    Status On-going                 Plan - 01/20/20 1751    Clinical Impression Statement Pt arrived today in good spirits. Rx focused on RT UE strengthening as well as mm control. Pt was able to perform resistance exs in some motions, but still limited to AROM/ AAROM in others. She needed AAROM for antigravity elevation due to early mm fatigue. She did well with sidelying flexion.    Personal Factors and Comorbidities Comorbidity 1;Comorbidity 2    Comorbidities Hypothyroidism, DM, h/o back pain, hernia repair, SCDF, OA.    Examination-Participation Restrictions Other    Stability/Clinical Decision Making Stable/Uncomplicated    Rehab Potential Excellent    PT Frequency 2x / week    PT Treatment/Interventions ADLs/Self Care Home Management;Cryotherapy;Electrical Stimulation;Ultrasound;Moist Heat;Therapeutic activities;Therapeutic exercise;Patient/family education;Passive range of motion;Vasopneumatic Device    PT Next Visit  Plan Progress as symptoms dictate with light strengthening and AROM. 16 weeks post op as of 01/13/2020.  Consulted and Agree with Plan of Care Patient           Patient will benefit from skilled therapeutic intervention in order to improve the following deficits and impairments:  Pain,Decreased activity tolerance,Increased edema,Decreased range of motion  Visit Diagnosis: Acute pain of right shoulder  Stiffness of right shoulder, not elsewhere classified     Problem List Patient Active Problem List   Diagnosis Date Noted  . Sebaceous cyst   . Class 2 severe obesity with serious comorbidity and body mass index (BMI) of 35.0 to 35.9 in adult ([place]) 10/27/2017  . Other fatigue 09/10/2017  . Shortness of breath on exertion 09/10/2017  . Type 2 diabetes mellitus without complication, without long-term current use of insulin ([place]) 09/10/2017  . Essential hypertension 09/10/2017  . Other hyperlipidemia 09/10/2017  . Other specified hypothyroidism 09/10/2017  . Herniated cervical disc 08/16/2016    Rutha Melgoza,CHRIS , PTA 01/20/2020, 6:03 PM  [facility] [place], [place], [address] Phone: [phone]   Fax:  [phone]  Name: Crystal Roberts MRN: [id number] Date of Birth: [birth year]

## 2020-01-24 DIAGNOSIS — M25511 Pain in right shoulder: Secondary | ICD-10-CM | POA: Diagnosis not present

## 2020-01-25 ENCOUNTER — Other Ambulatory Visit: Payer: Self-pay

## 2020-01-25 ENCOUNTER — Encounter: Payer: Self-pay | Admitting: Physical Therapy

## 2020-01-25 ENCOUNTER — Ambulatory Visit: Payer: 59 | Admitting: Physical Therapy

## 2020-01-25 DIAGNOSIS — M25511 Pain in right shoulder: Secondary | ICD-10-CM | POA: Diagnosis not present

## 2020-01-25 DIAGNOSIS — M25611 Stiffness of right shoulder, not elsewhere classified: Secondary | ICD-10-CM | POA: Diagnosis not present

## 2020-01-25 NOTE — Therapy (Unsigned)
[facility] [place], [place], [address] Phone: [phone]   Fax:  [phone]  Physical Therapy Treatment  Patient Details  Name: Crystal Roberts MRN: [id number] Date of Birth: [birth year] Referring Provider (PT): Ophelia Charter MD   Encounter Date: 01/25/2020   PT End of Session - 01/25/20 1639    Visit Number 21    Number of Visits 32    Date for PT Re-Evaluation 01/26/20    Authorization Type FOTO.    PT Start Time 1643    PT Stop Time 1725    PT Time Calculation (min) 42 min    Activity Tolerance Patient tolerated treatment well    Behavior During Therapy WFL for tasks assessed/performed           Past Medical History:  Diagnosis Date  . Anxiety   . Arthritis    neck- cervical disc herniation   . Asthma    as child  . Back pain   . Cancer ([place])    skin - low back , insitu  melanoma   . Constipation   . COVID-19 09/07/2019   sx fever/cough/chills/lost taste and smell  . Depression   . Diabetes mellitus without complication ([place])   . Dyspnea   . Gallbladder problem   . GERD (gastroesophageal reflux disease)   . H/O cardiovascular stress test 1990   pt. reports that she was having a lot of stress & she was seen by cardiac- had stress test & told that it was negative   . Hyperlipidemia   . Hypertension   . Hypothyroidism   . Leg edema   . Pulmonary embolism ([place]) [facility] at [facility]- on blood thinning med., thought to be related to lack of activity, smoking & BCP    Past Surgical History:  Procedure Laterality Date  . ANTERIOR CERVICAL DECOMP/DISCECTOMY FUSION N/A 08/16/2016   Procedure: Cervical Four-Five Anterior cervical decompression/discectomy/fusion;  Surgeon: Erline Levine, MD;  Location: [facility];  Service: Neurosurgery;  Laterality: N/A;  C4-5 Anterior cervical decompression/discectomy/fusion  . BACK SURGERY     sebaceous cyst removed  . CHOLECYSTECTOMY  0254   umbilical hernia ------ followed  cholecystectomy  . Cyst removed from back    . CYSTECTOMY Bilateral    cyst on ovaries  . HERNIA REPAIR     umbilical hernia repair   . MASS EXCISION N/A 11/21/2017   Procedure: EXCISION SEBACEOUS CYST ON BACK;  Surgeon: Aviva Signs, MD;  Location: [facility];  Service: General;  Laterality: N/A;  . SHOULDER ARTHROSCOPY WITH ROTATOR CUFF REPAIR AND SUBACROMIAL DECOMPRESSION Right 09/23/2019   Procedure: RIGHT SHOULDER ARTHROSCOPY DEBRIDEMENT, ACROMIOPLASTY, DISTAL CLAVICAL EXCISION ROTATOR CUFF REPAIR;  Surgeon: Hiram Gash, MD;  Location: [facility];  Service: Orthopedics;  Laterality: Right;  . TUBAL LIGATION      There were no vitals filed for this visit.   Subjective Assessment - 01/25/20 1639    Subjective COVID 19 screening performed on patient upon arrival. Reports that she mostly gets fatigued now but has remained diligent with HEP.    Pertinent History Hypothyroidism, DM, h/o back pain, hernia repair, SCDF, OA.    Patient Stated Goals Use right arm without difficulty.    Currently in Pain? No/denies              [facility] PT Assessment - 01/25/20 0001      Assessment   Medical Diagnosis RT shld DCR, SAD, massive RTC repair.  Referring Provider (PT) Ophelia Charter MD    Onset Date/Surgical Date 09/23/19    Next MD Visit 02/2020      Precautions   Precaution Comments Follow protocol.                         [place] Adult PT Treatment/Exercise - 01/25/20 0001      Elbow Exercises   Elbow Flexion Strengthening;Right;20 reps;10 reps;Seated;Bar weights/barbell    Bar Weights/Barbell (Elbow Flexion) 3 lbs      Shoulder Exercises: Prone   Retraction Strengthening;Right;20 reps;10 reps;Weights    Retraction Weight (lbs) 3    Extension Strengthening;Right;20 reps;10 reps;Weights    Extension Weight (lbs) 3      Shoulder Exercises: Standing   Protraction Strengthening;Right;20 reps;Theraband    Theraband Level (Shoulder Protraction) Level 1 ([place])     External Rotation Strengthening;Right;20 reps;Theraband    Theraband Level (Shoulder External Rotation) Level 1 ([place])    Internal Rotation Strengthening;Right;20 reps;Theraband    Theraband Level (Shoulder Internal Rotation) Level 1 ([place])    Flexion AROM;Right;20 reps    Extension Strengthening;Right;20 reps;Theraband    Theraband Level (Shoulder Extension) Level 1 ([place])    Row Strengthening;Right;20 reps;Theraband    Theraband Level (Shoulder Row) Level 1 ([place])    Other Standing Exercises Jar to cabinet (low and mid) x10 reps each      Shoulder Exercises: ROM/Strengthening   UBE (Upper Arm Bike) 90 RPM x8 min (forward/backward)      Modalities   Modalities Electrical Stimulation      Electrical Stimulation   Electrical Stimulation Location R shoulder    Electrical Stimulation Action Pre-Mod    Electrical Stimulation Parameters 80-150 hz x15 min    Electrical Stimulation Goals Pain                       PT Long Term Goals - 12/07/19 0807      PT LONG TERM GOAL #1   Title Independent with a HEP.    Time 8    Period Weeks    Status Achieved      PT LONG TERM GOAL #2   Title Active right shoulder flexion to 145 degrees so the patient can easily reach overhead.    Time 8    Period Weeks    Status On-going      PT LONG TERM GOAL #3   Title Active ER to 70 degrees+ to allow for easily donning/doffing of apparel.    Time 8      PT LONG TERM GOAL #4   Title Increase ROM so patient is able to reach behind back to L4.    Time 8    Period Weeks    Status On-going      PT LONG TERM GOAL #5   Title Increase shoulder strength to a solid 4 to 4+/5 to increase stability for performance of functional activities.    Time 8    Period Weeks    Status On-going      PT LONG TERM GOAL #6   Title Perform ADL's with pain not > 3/10.    Time 8    Period Weeks    Status On-going                 Plan - 01/25/20 1718    Clinical Impression  Statement Patient presented in clinic with greater AROM R shoulder flexion avaliability. Patient progressed to more light resisted  therex and required mod multimodal cueing to avoid trunk rotation or any other compensatory strategies. Patient reports greater muscle fatigue with ADLs at this time. Patient demonstrated minimal scapular elevation with shoulder flexion due to muscle fatigue after many reps. Normal stimulation response to reduce minimal discomfort from therex session.    Personal Factors and Comorbidities Comorbidity 1;Comorbidity 2    Comorbidities Hypothyroidism, DM, h/o back pain, hernia repair, SCDF, OA.    Examination-Activity Limitations Reach Overhead;Other    Examination-Participation Restrictions Other    Stability/Clinical Decision Making Stable/Uncomplicated    Rehab Potential Excellent    PT Frequency 2x / week    PT Duration 8 weeks    PT Treatment/Interventions ADLs/Self Care Home Management;Cryotherapy;Electrical Stimulation;Ultrasound;Moist Heat;Therapeutic activities;Therapeutic exercise;Patient/family education;Passive range of motion;Vasopneumatic Device    PT Next Visit Plan Progress as symptoms dictate with light strengthening and AROM. 16 weeks post op as of 01/13/2020.    Consulted and Agree with Plan of Care Patient           Patient will benefit from skilled therapeutic intervention in order to improve the following deficits and impairments:  Pain,Decreased activity tolerance,Increased edema,Decreased range of motion  Visit Diagnosis: Acute pain of right shoulder  Stiffness of right shoulder, not elsewhere classified     Problem List Patient Active Problem List   Diagnosis Date Noted  . Sebaceous cyst   . Class 2 severe obesity with serious comorbidity and body mass index (BMI) of 35.0 to 35.9 in adult ([place]) 10/27/2017  . Other fatigue 09/10/2017  . Shortness of breath on exertion 09/10/2017  . Type 2 diabetes mellitus without complication,  without long-term current use of insulin ([place]) 09/10/2017  . Essential hypertension 09/10/2017  . Other hyperlipidemia 09/10/2017  . Other specified hypothyroidism 09/10/2017  . Herniated cervical disc 08/16/2016    Standley Brooking, PTA 01/25/2020, 5:41 PM  [facility] [place], [place], [address] Phone: [phone]   Fax:  [phone]  Name: Crystal Roberts MRN: [id number] Date of Birth: 06/24/1955

## 2020-02-01 ENCOUNTER — Encounter (INDEPENDENT_AMBULATORY_CARE_PROVIDER_SITE_OTHER): Payer: Self-pay | Admitting: Bariatrics

## 2020-02-03 ENCOUNTER — Ambulatory Visit (INDEPENDENT_AMBULATORY_CARE_PROVIDER_SITE_OTHER): Payer: 59 | Admitting: Bariatrics

## 2020-02-03 ENCOUNTER — Ambulatory Visit: Payer: 59 | Admitting: Physical Therapy

## 2020-02-07 ENCOUNTER — Other Ambulatory Visit (HOSPITAL_COMMUNITY): Payer: Self-pay | Admitting: Family Medicine

## 2020-02-07 MED FILL — LOSARTAN-HCTZ 100-25 MG TAB: 100-25 | 30 days supply | Qty: 30 | Fill #0

## 2020-02-07 MED FILL — SIMVASTATIN 40 MG TABLET: 40 | 90 days supply | Qty: 90 | Fill #0

## 2020-02-07 MED FILL — MELOXICAM 15 MG TABLET: 15 | 90 days supply | Qty: 90 | Fill #0

## 2020-02-07 MED FILL — FLUoxetine HCL 20 MG CAPS: 20 | 90 days supply | Qty: 90 | Fill #0

## 2020-02-07 MED FILL — LEVOTHYROXINE 75 MCG TABLET: 75 | 90 days supply | Qty: 90 | Fill #0

## 2020-02-08 ENCOUNTER — Ambulatory Visit: Payer: 59 | Admitting: Physical Therapy

## 2020-02-08 ENCOUNTER — Other Ambulatory Visit: Payer: Self-pay

## 2020-02-08 ENCOUNTER — Encounter: Payer: Self-pay | Admitting: Physical Therapy

## 2020-02-08 DIAGNOSIS — M25611 Stiffness of right shoulder, not elsewhere classified: Secondary | ICD-10-CM

## 2020-02-08 DIAGNOSIS — M25511 Pain in right shoulder: Secondary | ICD-10-CM

## 2020-02-08 NOTE — Therapy (Signed)
[facility] [place], [place], [address] Phone: [phone]   Fax:  [phone]  Physical Therapy Treatment  Patient Details  Name: Crystal Roberts MRN: [id number] Date of Birth: 1955/02/09 Referring Provider (PT): Ophelia Charter MD   Encounter Date: 02/08/2020   PT End of Session - 02/08/20 1639    Visit Number 22    Number of Visits 32    Date for PT Re-Evaluation 02/16/20    Authorization Type FOTO.    PT Start Time 1638    PT Stop Time 1717    PT Time Calculation (min) 39 min    Activity Tolerance Patient tolerated treatment well    Behavior During Therapy WFL for tasks assessed/performed           Past Medical History:  Diagnosis Date  . Anxiety   . Arthritis    neck- cervical disc herniation   . Asthma    as child  . Back pain   . Cancer ([place])    skin - low back , insitu  melanoma   . Constipation   . COVID-19 09/07/2019   sx fever/cough/chills/lost taste and smell  . Depression   . Diabetes mellitus without complication ([place])   . Dyspnea   . Gallbladder problem   . GERD (gastroesophageal reflux disease)   . H/O cardiovascular stress test 1990   pt. reports that she was having a lot of stress & she was seen by cardiac- had stress test & told that it was negative   . Hyperlipidemia   . Hypertension   . Hypothyroidism   . Leg edema   . Pulmonary embolism ([place]) [place]. at [facility]- on blood thinning med., thought to be related to lack of activity, smoking & BCP    Past Surgical History:  Procedure Laterality Date  . ANTERIOR CERVICAL DECOMP/DISCECTOMY FUSION N/A 08/16/2016   Procedure: Cervical Four-Five Anterior cervical decompression/discectomy/fusion;  Surgeon: Erline Levine, MD;  Location: [place];  Service: Neurosurgery;  Laterality: N/A;  C4-5 Anterior cervical decompression/discectomy/fusion  . BACK SURGERY     sebaceous cyst removed  . CHOLECYSTECTOMY  0160   umbilical hernia ------ followed  cholecystectomy  . Cyst removed from back    . CYSTECTOMY Bilateral    cyst on ovaries  . HERNIA REPAIR     umbilical hernia repair   . MASS EXCISION N/A 11/21/2017   Procedure: EXCISION SEBACEOUS CYST ON BACK;  Surgeon: Aviva Signs, MD;  Location: [facility];  Service: General;  Laterality: N/A;  . SHOULDER ARTHROSCOPY WITH ROTATOR CUFF REPAIR AND SUBACROMIAL DECOMPRESSION Right 09/23/2019   Procedure: RIGHT SHOULDER ARTHROSCOPY DEBRIDEMENT, ACROMIOPLASTY, DISTAL CLAVICAL EXCISION ROTATOR CUFF REPAIR;  Surgeon: Hiram Gash, MD;  Location: [place];  Service: Orthopedics;  Laterality: Right;  . TUBAL LIGATION      There were no vitals filed for this visit.   Subjective Assessment - 02/08/20 1638    Subjective COVID 19 screening performed on patient upon arrival. Reports that she mostly gets fatigued now but has remained diligent with HEP. Recieved her TENS unit in the mail and has been using it at home.    Pertinent History Hypothyroidism, DM, h/o back pain, hernia repair, SCDF, OA.    Patient Stated Goals Use right arm without difficulty.    Currently in Pain? Yes    Pain Score 1     Pain Location Shoulder    Pain Orientation Left    Pain Descriptors /  Indicators Discomfort   "over worked"   Pain Type Surgical pain    Pain Onset More than a month ago    Pain Frequency Intermittent              [facility] PT Assessment - 02/08/20 0001      Assessment   Medical Diagnosis RT shld DCR, SAD, massive RTC repair.    Referring Provider (PT) Ophelia Charter MD    Onset Date/Surgical Date 09/23/19    Next MD Visit 02/2020      Precautions   Precaution Comments Follow protocol.                         [place] Adult PT Treatment/Exercise - 02/08/20 0001      Elbow Exercises   Elbow Flexion Strengthening;Right;20 reps;10 reps;Seated;Bar weights/barbell    Bar Weights/Barbell (Elbow Flexion) 3 lbs      Shoulder Exercises: Standing   Protraction  Strengthening;Right;20 reps;Theraband    Theraband Level (Shoulder Protraction) Level 1 (Yellow)    External Rotation Strengthening;Right;20 reps;Theraband    Theraband Level (Shoulder External Rotation) Level 1 (Yellow)    Internal Rotation Strengthening;Right;20 reps;Theraband    Theraband Level (Shoulder Internal Rotation) Level 1 (Yellow)    Flexion Strengthening;Right;20 reps;Weights    Shoulder Flexion Weight (lbs) 1    ABduction Strengthening;Right;20 reps;Weights    Shoulder ABduction Weight (lbs) 1    ABduction Limitations limited by fatigue    Extension Strengthening;Right;20 reps;Theraband    Theraband Level (Shoulder Extension) Level 2 (Red)    Row Strengthening;Right;20 reps;Theraband    Theraband Level (Shoulder Row) Level 2 (Red)    Other Standing Exercises R shoulder scaption 1# x20 reps      Shoulder Exercises: Pulleys   Flexion 3 minutes      Shoulder Exercises: ROM/Strengthening   UBE (Upper Arm Bike) 60 RPM x8 min (forward/backward)      Modalities   Modalities Vasopneumatic      Vasopneumatic   Number Minutes Vasopneumatic  10 minutes    Vasopnuematic Location  Shoulder    Vasopneumatic Pressure Low    Vasopneumatic Temperature  34/edema and pain                       PT Long Term Goals - 12/07/19 0807      PT LONG TERM GOAL #1   Title Independent with a HEP.    Time 8    Period Weeks    Status Achieved      PT LONG TERM GOAL #2   Title Active right shoulder flexion to 145 degrees so the patient can easily reach overhead.    Time 8    Period Weeks    Status On-going      PT LONG TERM GOAL #3   Title Active ER to 70 degrees+ to allow for easily donning/doffing of apparel.    Time 8      PT LONG TERM GOAL #4   Title Increase ROM so patient is able to reach behind back to L4.    Time 8    Period Weeks    Status On-going      PT LONG TERM GOAL #5   Title Increase shoulder strength to a solid 4 to 4+/5 to increase stability for  performance of functional activities.    Time 8    Period Weeks    Status On-going      PT LONG TERM GOAL #  6   Title Perform ADL's with pain not > 3/10.    Time 8    Period Weeks    Status On-going                 Plan - 02/08/20 1728    Clinical Impression Statement Patient presented in clinic with reports of minimal R shoulder discomfort which was more like "overworked" as she reports washing her husband's truck today. Patient able to tolerate therex well with intermittant increased resistance. Patient's R shoulder abduction limited with trunk lean compensation which was moderately corrected following VCs. Patient correlated to muscle fatigue. Patient required intermittant VCs for therex session. Normal vasopneumatic response noted following removal of the modality.    Personal Factors and Comorbidities Comorbidity 1;Comorbidity 2    Comorbidities Hypothyroidism, DM, h/o back pain, hernia repair, SCDF, OA.    Examination-Activity Limitations Reach Overhead;Other    Examination-Participation Restrictions Other    Stability/Clinical Decision Making Stable/Uncomplicated    Rehab Potential Excellent    PT Frequency 2x / week    PT Duration 8 weeks    PT Treatment/Interventions ADLs/Self Care Home Management;Cryotherapy;Electrical Stimulation;Ultrasound;Moist Heat;Therapeutic activities;Therapeutic exercise;Patient/family education;Passive range of motion;Vasopneumatic Device    PT Next Visit Plan Progress as symptoms dictate with light strengthening and AROM. 16 weeks post op as of 01/13/2020.    Consulted and Agree with Plan of Care Patient           Patient will benefit from skilled therapeutic intervention in order to improve the following deficits and impairments:  Pain,Decreased activity tolerance,Increased edema,Decreased range of motion  Visit Diagnosis: Acute pain of right shoulder  Stiffness of right shoulder, not elsewhere classified     Problem List Patient  Active Problem List   Diagnosis Date Noted  . Sebaceous cyst   . Class 2 severe obesity with serious comorbidity and body mass index (BMI) of 35.0 to 35.9 in adult ([place]) 10/27/2017  . Other fatigue 09/10/2017  . Shortness of breath on exertion 09/10/2017  . Type 2 diabetes mellitus without complication, without long-term current use of insulin ([place]) 09/10/2017  . Essential hypertension 09/10/2017  . Other hyperlipidemia 09/10/2017  . Other specified hypothyroidism 09/10/2017  . Herniated cervical disc 08/16/2016    Standley Brooking, PTA 02/08/2020, 5:32 PM  [facility] [place], [place], [address] Phone: [phone]   Fax:  [phone]  Name: Crystal Roberts MRN: [id number] Date of Birth: Jan 27, 1955

## 2020-02-15 ENCOUNTER — Ambulatory Visit: Payer: 59 | Attending: Orthopaedic Surgery | Admitting: Physical Therapy

## 2020-02-15 ENCOUNTER — Other Ambulatory Visit: Payer: Self-pay

## 2020-02-15 ENCOUNTER — Encounter: Payer: Self-pay | Admitting: Physical Therapy

## 2020-02-15 DIAGNOSIS — M25611 Stiffness of right shoulder, not elsewhere classified: Secondary | ICD-10-CM | POA: Insufficient documentation

## 2020-02-15 DIAGNOSIS — M25511 Pain in right shoulder: Secondary | ICD-10-CM | POA: Insufficient documentation

## 2020-02-15 NOTE — Therapy (Signed)
[facility] [place], [place], [address] Phone: [phone]   Fax:  [phone]  Physical Therapy Treatment  Patient Details  Name: Crystal Roberts MRN: [id number] Date of Birth: 10-14-1955 Referring Provider (PT): Ophelia Charter MD   Encounter Date: 02/15/2020   PT End of Session - 02/15/20 1650    Visit Number 23    Number of Visits 32    Date for PT Re-Evaluation 04/21/20    Authorization Type FOTO.    PT Start Time 1647    PT Stop Time 1723    PT Time Calculation (min) 36 min    Activity Tolerance Patient tolerated treatment well    Behavior During Therapy WFL for tasks assessed/performed           Past Medical History:  Diagnosis Date  . Anxiety   . Arthritis    neck- cervical disc herniation   . Asthma    as child  . Back pain   . Cancer ([place])    skin - low back , insitu  melanoma   . Constipation   . COVID-19 09/07/2019   sx fever/cough/chills/lost taste and smell  . Depression   . Diabetes mellitus without complication ([place])   . Dyspnea   . Gallbladder problem   . GERD (gastroesophageal reflux disease)   . H/O cardiovascular stress test 1990   pt. reports that she was having a lot of stress & she was seen by cardiac- had stress test & told that it was negative   . Hyperlipidemia   . Hypertension   . Hypothyroidism   . Leg edema   . Pulmonary embolism ([place]) [place]. at [facility]- on blood thinning med., thought to be related to lack of activity, smoking & BCP    Past Surgical History:  Procedure Laterality Date  . ANTERIOR CERVICAL DECOMP/DISCECTOMY FUSION N/A 08/16/2016   Procedure: Cervical Four-Five Anterior cervical decompression/discectomy/fusion;  Surgeon: Erline Levine, MD;  Location: [place];  Service: Neurosurgery;  Laterality: N/A;  C4-5 Anterior cervical decompression/discectomy/fusion  . BACK SURGERY     sebaceous cyst removed  . CHOLECYSTECTOMY  [id number]   umbilical hernia ------ followed  cholecystectomy  . Cyst removed from back    . CYSTECTOMY Bilateral    cyst on ovaries  . HERNIA REPAIR     umbilical hernia repair   . MASS EXCISION N/A 11/21/2017   Procedure: EXCISION SEBACEOUS CYST ON BACK;  Surgeon: Aviva Signs, MD;  Location: [facility];  Service: General;  Laterality: N/A;  . SHOULDER ARTHROSCOPY WITH ROTATOR CUFF REPAIR AND SUBACROMIAL DECOMPRESSION Right 09/23/2019   Procedure: RIGHT SHOULDER ARTHROSCOPY DEBRIDEMENT, ACROMIOPLASTY, DISTAL CLAVICAL EXCISION ROTATOR CUFF REPAIR;  Surgeon: Hiram Gash, MD;  Location: [place];  Service: Orthopedics;  Laterality: Right;  . TUBAL LIGATION      There were no vitals filed for this visit.   Subjective Assessment - 02/15/20 1648    Subjective COVID 19 screening performed on patient upon arrival. Reports using TENS unit due to soreness.    Pertinent History Hypothyroidism, DM, h/o back pain, hernia repair, SCDF, OA.    Patient Stated Goals Use right arm without difficulty.    Currently in Pain? Yes    Pain Score 2     Pain Location Shoulder    Pain Orientation Left    Pain Descriptors / Indicators Sore    Pain Type Surgical pain    Pain Onset More than a month ago  Pain Frequency Intermittent              [facility] PT Assessment - 02/15/20 0001      Assessment   Medical Diagnosis RT shld DCR, SAD, massive RTC repair.    Referring Provider (PT) Ophelia Charter MD    Onset Date/Surgical Date 09/23/19    Next MD Visit 02/2020      Precautions   Precaution Comments Follow protocol.                         [place] Adult PT Treatment/Exercise - 02/15/20 0001      Elbow Exercises   Elbow Flexion Strengthening;Right;20 reps;10 reps;Seated;Bar weights/barbell    Bar Weights/Barbell (Elbow Flexion) 3 lbs      Shoulder Exercises: Standing   Protraction Strengthening;Right;20 reps;Theraband    Theraband Level (Shoulder Protraction) Level 2 (Red)    External Rotation Strengthening;Right;20  reps;Theraband    Theraband Level (Shoulder External Rotation) Level 2 (Red)    Internal Rotation Strengthening;Right;20 reps;Theraband    Theraband Level (Shoulder Internal Rotation) Level 2 (Red)    Flexion Strengthening;Right;Weights    Shoulder Flexion Weight (lbs) 2    Flexion Limitations unable to complete due to fatigue    Extension Strengthening;Right;20 reps;Theraband    Theraband Level (Shoulder Extension) Level 2 (Red)    Row Strengthening;Right;20 reps;Theraband    Theraband Level (Shoulder Row) Level 2 (Red)      Shoulder Exercises: Pulleys   Flexion 5 minutes      Shoulder Exercises: ROM/Strengthening   UBE (Upper Arm Bike) 60 RPM x8 min (forward/backward)      Modalities   Modalities Vasopneumatic      Vasopneumatic   Number Minutes Vasopneumatic  10 minutes    Vasopnuematic Location  Shoulder    Vasopneumatic Pressure Low    Vasopneumatic Temperature  34/pain                       PT Long Term Goals - 12/07/19 0254      PT LONG TERM GOAL #1   Title Independent with a HEP.    Time 8    Period Weeks    Status Achieved      PT LONG TERM GOAL #2   Title Active right shoulder flexion to 145 degrees so the patient can easily reach overhead.    Time 8    Period Weeks    Status On-going      PT LONG TERM GOAL #3   Title Active ER to 70 degrees+ to allow for easily donning/doffing of apparel.    Time 8      PT LONG TERM GOAL #4   Title Increase ROM so patient is able to reach behind back to L4.    Time 8    Period Weeks    Status On-going      PT LONG TERM GOAL #5   Title Increase shoulder strength to a solid 4 to 4+/5 to increase stability for performance of functional activities.    Time 8    Period Weeks    Status On-going      PT LONG TERM GOAL #6   Title Perform ADL's with pain not > 3/10.    Time 8    Period Weeks    Status On-going                 Plan - 02/15/20 1717    Clinical Impression Statement  Patient  presented in clinic with minimal reports of R shoulder soreness. Patient compliant with HEP and use of TENS unit to control pain. Patient guided through resistive strengthening with an increase in the resistance. Patient observed with lack of full shoulder flexion when reaching for her hair or mask to which she correlated to fatigue. Patient limited with shoulder flexion exercise due to muscle fatigue as well. Normal vasopneumatic response noted following removal of the modality.    Personal Factors and Comorbidities Comorbidity 1;Comorbidity 2    Comorbidities Hypothyroidism, DM, h/o back pain, hernia repair, SCDF, OA.    Examination-Activity Limitations Reach Overhead;Other    Examination-Participation Restrictions Other    Stability/Clinical Decision Making Stable/Uncomplicated    Rehab Potential Excellent    PT Frequency 2x / week    PT Duration 8 weeks    PT Treatment/Interventions ADLs/Self Care Home Management;Cryotherapy;Electrical Stimulation;Ultrasound;Moist Heat;Therapeutic activities;Therapeutic exercise;Patient/family education;Passive range of motion;Vasopneumatic Device    PT Next Visit Plan Progress as symptoms dictate with light strengthening and AROM. 16 weeks post op as of 01/13/2020.    Consulted and Agree with Plan of Care Patient           Patient will benefit from skilled therapeutic intervention in order to improve the following deficits and impairments:  Pain,Decreased activity tolerance,Increased edema,Decreased range of motion  Visit Diagnosis: Acute pain of right shoulder  Stiffness of right shoulder, not elsewhere classified     Problem List Patient Active Problem List   Diagnosis Date Noted  . Sebaceous cyst   . Class 2 severe obesity with serious comorbidity and body mass index (BMI) of 35.0 to 35.9 in adult ([place]) 10/27/2017  . Other fatigue 09/10/2017  . Shortness of breath on exertion 09/10/2017  . Type 2 diabetes mellitus without complication,  without long-term current use of insulin ([place]) 09/10/2017  . Essential hypertension 09/10/2017  . Other hyperlipidemia 09/10/2017  . Other specified hypothyroidism 09/10/2017  . Herniated cervical disc 08/16/2016    Standley Brooking, PTA 02/15/2020, 5:43 PM  [facility] [place], [place], [address] Phone: [phone]   Fax:  [phone]  Name: Crystal Roberts MRN: [id number] Date of Birth: 11/21/1955

## 2020-02-22 ENCOUNTER — Other Ambulatory Visit: Payer: Self-pay

## 2020-02-22 ENCOUNTER — Ambulatory Visit: Payer: 59 | Admitting: Physical Therapy

## 2020-02-22 ENCOUNTER — Encounter: Payer: Self-pay | Admitting: Physical Therapy

## 2020-02-22 DIAGNOSIS — M25511 Pain in right shoulder: Secondary | ICD-10-CM | POA: Diagnosis not present

## 2020-02-22 DIAGNOSIS — M25611 Stiffness of right shoulder, not elsewhere classified: Secondary | ICD-10-CM | POA: Diagnosis not present

## 2020-02-22 NOTE — Therapy (Signed)
[facility] [place], [place], [address] Phone: [phone]   Fax:  [phone]  Physical Therapy Treatment  Patient Details  Name: Crystal Roberts MRN: [id number] Date of Birth: [birth year] Referring Provider (PT): Ophelia Charter MD   Encounter Date: 02/22/2020   PT End of Session - 02/22/20 1725    Visit Number 24    Number of Visits 32    Date for PT Re-Evaluation 04/21/20    Authorization Type FOTO.    PT Start Time 1645    PT Stop Time 1733    PT Time Calculation (min) 48 min    Activity Tolerance Patient tolerated treatment well    Behavior During Therapy WFL for tasks assessed/performed           Past Medical History:  Diagnosis Date  . Anxiety   . Arthritis    neck- cervical disc herniation   . Asthma    as child  . Back pain   . Cancer ([place])    skin - low back , insitu  melanoma   . Constipation   . COVID-19 09/07/2019   sx fever/cough/chills/lost taste and smell  . Depression   . Diabetes mellitus without complication ([place])   . Dyspnea   . Gallbladder problem   . GERD (gastroesophageal reflux disease)   . H/O cardiovascular stress test 1990   pt. reports that she was having a lot of stress & she was seen by cardiac- had stress test & told that it was negative   . Hyperlipidemia   . Hypertension   . Hypothyroidism   . Leg edema   . Pulmonary embolism ([place]) [place]. at [facility]- on blood thinning med., thought to be related to lack of activity, smoking & BCP    Past Surgical History:  Procedure Laterality Date  . ANTERIOR CERVICAL DECOMP/DISCECTOMY FUSION N/A 08/16/2016   Procedure: Cervical Four-Five Anterior cervical decompression/discectomy/fusion;  Surgeon: Erline Levine, MD;  Location: [place];  Service: Neurosurgery;  Laterality: N/A;  C4-5 Anterior cervical decompression/discectomy/fusion  . BACK SURGERY     sebaceous cyst removed  . CHOLECYSTECTOMY  2536   umbilical hernia ------ followed  cholecystectomy  . Cyst removed from back    . CYSTECTOMY Bilateral    cyst on ovaries  . HERNIA REPAIR     umbilical hernia repair   . MASS EXCISION N/A 11/21/2017   Procedure: EXCISION SEBACEOUS CYST ON BACK;  Surgeon: Aviva Signs, MD;  Location: [facility];  Service: General;  Laterality: N/A;  . SHOULDER ARTHROSCOPY WITH ROTATOR CUFF REPAIR AND SUBACROMIAL DECOMPRESSION Right 09/23/2019   Procedure: RIGHT SHOULDER ARTHROSCOPY DEBRIDEMENT, ACROMIOPLASTY, DISTAL CLAVICAL EXCISION ROTATOR CUFF REPAIR;  Surgeon: Hiram Gash, MD;  Location: [place];  Service: Orthopedics;  Laterality: Right;  . TUBAL LIGATION      There were no vitals filed for this visit.   Subjective Assessment - 02/22/20 1653    Subjective COVID 19 screening performed on patient upon arrival. Reports ongoing soreness in shoulder about 3-4/10    Pertinent History Hypothyroidism, DM, h/o back pain, hernia repair, SCDF, OA.    Patient Stated Goals Use right arm without difficulty.    Currently in Pain? Yes    Pain Score 4     Pain Location Shoulder    Pain Orientation Right    Pain Descriptors / Indicators Sore    Pain Type Surgical pain    Pain Onset More than a month ago  Pain Frequency Intermittent              [facility] PT Assessment - 02/22/20 0001      Assessment   Medical Diagnosis RT shld DCR, SAD, massive RTC repair.    Referring Provider (PT) Ophelia Charter MD    Onset Date/Surgical Date 09/23/19    Next MD Visit 03/09/2020      Precautions   Precaution Comments Follow protocol.      AROM   Right Shoulder Flexion 128 Degrees    Right Shoulder External Rotation 60 Degrees                         [facility] Adult PT Treatment/Exercise - 02/22/20 0001      Elbow Exercises   Elbow Flexion Strengthening;Right;20 reps;10 reps;Seated;Bar weights/barbell   bicep curls and hammer curls   Bar Weights/Barbell (Elbow Flexion) 3 lbs      Shoulder Exercises: Prone   Extension  Strengthening;Right;20 reps;10 reps;Weights    Extension Weight (lbs) 4    Other Prone Exercises bent over tricep extension 3x10 4#      Shoulder Exercises: Standing   Protraction Strengthening;Right;20 reps;Theraband    Theraband Level (Shoulder Protraction) Level 2 (Red)    External Rotation Strengthening;Right;20 reps;Theraband    Theraband Level (Shoulder External Rotation) Level 2 (Red)    Internal Rotation Strengthening;Right;20 reps;Theraband    Theraband Level (Shoulder Internal Rotation) Level 2 (Red)    Extension Strengthening;Right;20 reps;Theraband    Theraband Level (Shoulder Extension) Level 2 (Red)    Row Strengthening;Right;20 reps;Theraband    Theraband Level (Shoulder Row) Level 2 (Red)      Shoulder Exercises: Pulleys   Flexion 5 minutes      Shoulder Exercises: ROM/Strengthening   UBE (Upper Arm Bike) 60 RPM x8 min (forward/backward)    Wall Pushups 10 reps      Modalities   Modalities Vasopneumatic      Vasopneumatic   Number Minutes Vasopneumatic  10 minutes    Vasopnuematic Location  Shoulder    Vasopneumatic Pressure Low    Vasopneumatic Temperature  34/pain                       PT Long Term Goals - 02/22/20 1717      PT LONG TERM GOAL #1   Title Independent with a HEP.    Time 8    Period Weeks    Status Achieved      PT LONG TERM GOAL #2   Title Active right shoulder flexion to 145 degrees so the patient can easily reach overhead.    Time 8    Period Weeks    Status On-going   128     PT LONG TERM GOAL #3   Title Active ER to 70 degrees+ to allow for easily donning/doffing of apparel.    Time 8    Period Weeks    Status On-going   60     PT LONG TERM GOAL #4   Title Increase ROM so patient is able to reach behind back to L4.    Time 8    Period Weeks    Status On-going   R glute     PT LONG TERM GOAL #5   Title Increase shoulder strength to a solid 4 to 4+/5 to increase stability for performance of functional  activities.    Time 8    Period Weeks    Status  On-going      PT LONG TERM GOAL #6   Title Perform ADL's with pain not > 3/10.    Time 8    Period Weeks    Status On-going   at most 4/10                Plan - 02/22/20 1715    Clinical Impression Statement Patient responded well to therapy session despite  ongoing right shoulder soreness. Patient was able to complete TEs with verbal and tactile cuing with good response and good carryover for remaining reps. Patient noted more discomfort along delotid region that runs across to biceps and greater tubercle region. No adverse affects upon removal of modalities.    Personal Factors and Comorbidities Comorbidity 1;Comorbidity 2    Comorbidities Hypothyroidism, DM, h/o back pain, hernia repair, SCDF, OA.    Examination-Activity Limitations Reach Overhead;Other    Examination-Participation Restrictions Other    Stability/Clinical Decision Making Stable/Uncomplicated    Clinical Decision Making Low    Rehab Potential Excellent    PT Frequency 2x / week    PT Duration 8 weeks    PT Treatment/Interventions ADLs/Self Care Home Management;Cryotherapy;Electrical Stimulation;Ultrasound;Moist Heat;Therapeutic activities;Therapeutic exercise;Patient/family education;Passive range of motion;Vasopneumatic Device    PT Next Visit Plan Progress as symptoms dictate with light strengthening and AROM. 16 weeks post op as of 01/13/2020.    Consulted and Agree with Plan of Care Patient           Patient will benefit from skilled therapeutic intervention in order to improve the following deficits and impairments:  Pain,Decreased activity tolerance,Increased edema,Decreased range of motion  Visit Diagnosis: Acute pain of right shoulder  Stiffness of right shoulder, not elsewhere classified     Problem List Patient Active Problem List   Diagnosis Date Noted  . Sebaceous cyst   . Class 2 severe obesity with serious comorbidity and body mass  index (BMI) of 35.0 to 35.9 in adult ([place]) 10/27/2017  . Other fatigue 09/10/2017  . Shortness of breath on exertion 09/10/2017  . Type 2 diabetes mellitus without complication, without long-term current use of insulin ([place]) 09/10/2017  . Essential hypertension 09/10/2017  . Other hyperlipidemia 09/10/2017  . Other specified hypothyroidism 09/10/2017  . Herniated cervical disc 08/16/2016    Gabriela Eves, PT, DPT 02/22/2020, 5:38 PM  [facility] [place], [place], [address] Phone: [phone]   Fax:  [phone]  Name: Crystal Roberts MRN: [id number] Date of Birth: Nov 09, 1955

## 2020-02-24 DIAGNOSIS — M25511 Pain in right shoulder: Secondary | ICD-10-CM | POA: Diagnosis not present

## 2020-02-29 ENCOUNTER — Ambulatory Visit: Payer: 59 | Admitting: Physical Therapy

## 2020-02-29 ENCOUNTER — Other Ambulatory Visit: Payer: Self-pay

## 2020-02-29 ENCOUNTER — Encounter: Payer: Self-pay | Admitting: Physical Therapy

## 2020-02-29 DIAGNOSIS — M25611 Stiffness of right shoulder, not elsewhere classified: Secondary | ICD-10-CM

## 2020-02-29 DIAGNOSIS — M25511 Pain in right shoulder: Secondary | ICD-10-CM | POA: Diagnosis not present

## 2020-02-29 NOTE — Therapy (Signed)
[facility] [place], [place], [address] Phone: [phone]   Fax:  [phone]  Physical Therapy Treatment  Patient Details  Name: Crystal Roberts MRN: [id number] Date of Birth: [birth year] Referring Provider (PT): Ophelia Charter MD   Encounter Date: 02/29/2020   PT End of Session - 02/29/20 1635    Visit Number 25    Number of Visits 32    Date for PT Re-Evaluation 04/21/20    Authorization Type FOTO.    PT Start Time 1636    PT Stop Time 1718    PT Time Calculation (min) 42 min    Activity Tolerance Patient tolerated treatment well    Behavior During Therapy WFL for tasks assessed/performed           Past Medical History:  Diagnosis Date  . Anxiety   . Arthritis    neck- cervical disc herniation   . Asthma    as child  . Back pain   . Cancer ([place])    skin - low back , insitu  melanoma   . Constipation   . COVID-19 09/07/2019   sx fever/cough/chills/lost taste and smell  . Depression   . Diabetes mellitus without complication ([place])   . Dyspnea   . Gallbladder problem   . GERD (gastroesophageal reflux disease)   . H/O cardiovascular stress test 1990   pt. reports that she was having a lot of stress & she was seen by cardiac- had stress test & told that it was negative   . Hyperlipidemia   . Hypertension   . Hypothyroidism   . Leg edema   . Pulmonary embolism ([place]) [place]. at [facility]- on blood thinning med., thought to be related to lack of activity, smoking & BCP    Past Surgical History:  Procedure Laterality Date  . ANTERIOR CERVICAL DECOMP/DISCECTOMY FUSION N/A 08/16/2016   Procedure: Cervical Four-Five Anterior cervical decompression/discectomy/fusion;  Surgeon: Erline Levine, MD;  Location: [facility];  Service: Neurosurgery;  Laterality: N/A;  C4-5 Anterior cervical decompression/discectomy/fusion  . BACK SURGERY     sebaceous cyst removed  . CHOLECYSTECTOMY  1287   umbilical hernia ------ followed  cholecystectomy  . Cyst removed from back    . CYSTECTOMY Bilateral    cyst on ovaries  . HERNIA REPAIR     umbilical hernia repair   . MASS EXCISION N/A 11/21/2017   Procedure: EXCISION SEBACEOUS CYST ON BACK;  Surgeon: Aviva Signs, MD;  Location: [facility];  Service: General;  Laterality: N/A;  . SHOULDER ARTHROSCOPY WITH ROTATOR CUFF REPAIR AND SUBACROMIAL DECOMPRESSION Right 09/23/2019   Procedure: RIGHT SHOULDER ARTHROSCOPY DEBRIDEMENT, ACROMIOPLASTY, DISTAL CLAVICAL EXCISION ROTATOR CUFF REPAIR;  Surgeon: Hiram Gash, MD;  Location: [place];  Service: Orthopedics;  Laterality: Right;  . TUBAL LIGATION      There were no vitals filed for this visit.   Subjective Assessment - 02/29/20 1635    Subjective COVID 19 screening performed on patient upon arrival. Reports some pain upon arrival but correlates to how she sleeps.    Pertinent History Hypothyroidism, DM, h/o back pain, hernia repair, SCDF, OA.    Patient Stated Goals Use right arm without difficulty.    Currently in Pain? Yes    Pain Score 2     Pain Location Shoulder    Pain Orientation Right    Pain Descriptors / Indicators Sore    Pain Type Surgical pain    Pain Onset More  than a month ago    Pain Frequency Intermittent              [facility] PT Assessment - 02/29/20 0001      Assessment   Medical Diagnosis RT shld DCR, SAD, massive RTC repair.    Referring Provider (PT) Ophelia Charter MD    Onset Date/Surgical Date 09/23/19    Next MD Visit 03/09/2020      Precautions   Precaution Comments Follow protocol.                         [facility] Adult PT Treatment/Exercise - 02/29/20 0001      Elbow Exercises   Elbow Flexion Strengthening;Right;20 reps;10 reps;Standing;Bar weights/barbell   regular curl and hammer curls   Bar Weights/Barbell (Elbow Flexion) 3 lbs      Shoulder Exercises: Standing   External Rotation Strengthening;Right;20 reps;Theraband    Theraband Level (Shoulder  External Rotation) Level 2 (Red)    Internal Rotation Strengthening;Right;20 reps;Theraband    Theraband Level (Shoulder Internal Rotation) Level 2 (Red)    Extension Strengthening;Right;20 reps;Theraband    Theraband Level (Shoulder Extension) Level 2 (Red)    Row Strengthening;Right;20 reps;Theraband    Theraband Level (Shoulder Row) Level 2 (Red)      Shoulder Exercises: Pulleys   Flexion 5 minutes      Shoulder Exercises: ROM/Strengthening   UBE (Upper Arm Bike) 60 RPM x8 min (forward/backward)    Wall Pushups 20 reps      Modalities   Modalities Vasopneumatic      Vasopneumatic   Number Minutes Vasopneumatic  10 minutes    Vasopnuematic Location  Shoulder    Vasopneumatic Pressure Low    Vasopneumatic Temperature  34/pain                       PT Long Term Goals - 02/22/20 1717      PT LONG TERM GOAL #1   Title Independent with a HEP.    Time 8    Period Weeks    Status Achieved      PT LONG TERM GOAL #2   Title Active right shoulder flexion to 145 degrees so the patient can easily reach overhead.    Time 8    Period Weeks    Status On-going   128     PT LONG TERM GOAL #3   Title Active ER to 70 degrees+ to allow for easily donning/doffing of apparel.    Time 8    Period Weeks    Status On-going   60     PT LONG TERM GOAL #4   Title Increase ROM so patient is able to reach behind back to L4.    Time 8    Period Weeks    Status On-going   R glute     PT LONG TERM GOAL #5   Title Increase shoulder strength to a solid 4 to 4+/5 to increase stability for performance of functional activities.    Time 8    Period Weeks    Status On-going      PT LONG TERM GOAL #6   Title Perform ADL's with pain not > 3/10.    Time 8    Period Weeks    Status On-going   at most 4/10                Plan - 02/29/20 1711    Clinical Impression  Statement Patient presented in clinic with minimal R shoulder pain. Patient correlates some discomfort with  how she sleeps. Patient able to tolerate therex fairly well with improved technique with resisted ER due to less fatigue. By the end of therex session RUE is very fatigued. Patient limited with shoulder flexion with resistance due to fatigue. Normal vasopneumatic response noted following removal of the modality.    Personal Factors and Comorbidities Comorbidity 1;Comorbidity 2    Comorbidities Hypothyroidism, DM, h/o back pain, hernia repair, SCDF, OA.    Examination-Activity Limitations Reach Overhead;Other    Examination-Participation Restrictions Other    Stability/Clinical Decision Making Stable/Uncomplicated    Rehab Potential Excellent    PT Frequency 2x / week    PT Duration 8 weeks    PT Treatment/Interventions ADLs/Self Care Home Management;Cryotherapy;Electrical Stimulation;Ultrasound;Moist Heat;Therapeutic activities;Therapeutic exercise;Patient/family education;Passive range of motion;Vasopneumatic Device    PT Next Visit Plan Progress as symptoms dictate with light strengthening and AROM. 16 weeks post op as of 01/13/2020. MD note required.    Consulted and Agree with Plan of Care Patient           Patient will benefit from skilled therapeutic intervention in order to improve the following deficits and impairments:  Pain,Decreased activity tolerance,Increased edema,Decreased range of motion  Visit Diagnosis: Acute pain of right shoulder  Stiffness of right shoulder, not elsewhere classified     Problem List Patient Active Problem List   Diagnosis Date Noted  . Sebaceous cyst   . Class 2 severe obesity with serious comorbidity and body mass index (BMI) of 35.0 to 35.9 in adult ([place]) 10/27/2017  . Other fatigue 09/10/2017  . Shortness of breath on exertion 09/10/2017  . Type 2 diabetes mellitus without complication, without long-term current use of insulin ([place]) 09/10/2017  . Essential hypertension 09/10/2017  . Other hyperlipidemia 09/10/2017  . Other specified  hypothyroidism 09/10/2017  . Herniated cervical disc 08/16/2016    Standley Brooking, PTA 02/29/2020, 5:23 PM  [facility] [address] [place], [place], [address] Phone: [phone]   Fax:  [phone]  Name: Crystal Roberts MRN: [id number] Date of Birth: 1955/11/06

## 2020-03-07 ENCOUNTER — Other Ambulatory Visit (HOSPITAL_COMMUNITY): Payer: Self-pay | Admitting: Family Medicine

## 2020-03-07 ENCOUNTER — Encounter: Payer: Self-pay | Admitting: Physical Therapy

## 2020-03-07 ENCOUNTER — Other Ambulatory Visit: Payer: Self-pay

## 2020-03-07 ENCOUNTER — Ambulatory Visit: Payer: 59 | Admitting: Physical Therapy

## 2020-03-07 DIAGNOSIS — M25511 Pain in right shoulder: Secondary | ICD-10-CM

## 2020-03-07 DIAGNOSIS — M25611 Stiffness of right shoulder, not elsewhere classified: Secondary | ICD-10-CM

## 2020-03-07 MED FILL — buPROPion HCL ER (XL) 300 M: 300 | 90 days supply | Qty: 90 | Fill #0

## 2020-03-07 NOTE — Therapy (Signed)
[facility] [place], [place], [address] Phone: [phone]   Fax:  [phone]  Physical Therapy Treatment  Patient Details  Name: Crystal Roberts MRN: [id number] Date of Birth: December 22, 1955 Referring Provider (PT): Ophelia Charter MD   Encounter Date: 03/07/2020   PT End of Session - 03/07/20 1644    Visit Number 26    Number of Visits 32    Date for PT Re-Evaluation 04/21/20    Authorization Type FOTO.    PT Start Time 1219    PT Stop Time 1729    PT Time Calculation (min) 46 min    Activity Tolerance Patient tolerated treatment well    Behavior During Therapy WFL for tasks assessed/performed           Past Medical History:  Diagnosis Date   Anxiety    Arthritis    neck- cervical disc herniation    Asthma    as child   Back pain    Cancer ([place])    skin - low back , insitu  melanoma    Constipation    COVID-19 09/07/2019   sx fever/cough/chills/lost taste and smell   Depression    Diabetes mellitus without complication ([place])    Dyspnea    Gallbladder problem    GERD (gastroesophageal reflux disease)    H/O cardiovascular stress test 1990   pt. reports that she was having a lot of stress & she was seen by cardiac- had stress test & told that it was negative    Hyperlipidemia    Hypertension    Hypothyroidism    Leg edema    Pulmonary embolism ([place]) [place]. at [facility]- on blood thinning med., thought to be related to lack of activity, smoking & BCP    Past Surgical History:  Procedure Laterality Date   ANTERIOR CERVICAL DECOMP/DISCECTOMY FUSION N/A 08/16/2016   Procedure: Cervical Four-Five Anterior cervical decompression/discectomy/fusion;  Surgeon: Erline Levine, MD;  Location: [facility];  Service: Neurosurgery;  Laterality: N/A;  C4-5 Anterior cervical decompression/discectomy/fusion   BACK SURGERY     sebaceous cyst removed   CHOLECYSTECTOMY  7588   umbilical hernia ------ followed  cholecystectomy   Cyst removed from back     CYSTECTOMY Bilateral    cyst on ovaries   HERNIA REPAIR     umbilical hernia repair    MASS EXCISION N/A 11/21/2017   Procedure: EXCISION SEBACEOUS CYST ON BACK;  Surgeon: Aviva Signs, MD;  Location: [facility];  Service: General;  Laterality: N/A;   SHOULDER ARTHROSCOPY WITH ROTATOR CUFF REPAIR AND SUBACROMIAL DECOMPRESSION Right 09/23/2019   Procedure: RIGHT SHOULDER ARTHROSCOPY DEBRIDEMENT, ACROMIOPLASTY, DISTAL CLAVICAL EXCISION ROTATOR CUFF REPAIR;  Surgeon: Hiram Gash, MD;  Location: [facility];  Service: Orthopedics;  Laterality: Right;   TUBAL LIGATION      There were no vitals filed for this visit.   Subjective Assessment - 03/07/20 1643    Subjective COVID 19 screening performed on patient upon arrival. Reports more soreness of R bicep region with certain movements. Unable to determine the cause.    Pertinent History Hypothyroidism, DM, h/o back pain, hernia repair, SCDF, OA.    Patient Stated Goals Use right arm without difficulty.    Currently in Pain? Yes    Pain Score 1     Pain Location Arm    Pain Orientation Right;Anterior;Proximal    Pain Descriptors / Indicators Sore    Pain Type Surgical pain  Pain Onset More than a month ago    Pain Frequency Intermittent              [facility] PT Assessment - 03/07/20 0001      Assessment   Medical Diagnosis RT shld DCR, SAD, massive RTC repair.    Referring Provider (PT) Ophelia Charter MD    Onset Date/Surgical Date 09/23/19    Next MD Visit 03/09/2020      Precautions   Precaution Comments Follow protocol.      Observation/Other Assessments   Focus on Therapeutic Outcomes (FOTO)  37% limitation at visit 26 on 03/07/2020      ROM / Strength   AROM / PROM / Strength Strength;AROM      AROM   Overall AROM  Deficits    AROM Assessment Site Shoulder    Right/Left Shoulder Right    Right Shoulder Flexion 120 Degrees    Right Shoulder External Rotation 85  Degrees      Strength   Overall Strength Deficits    Strength Assessment Site Shoulder    Right/Left Shoulder Right    Right Shoulder Flexion 3+/5    Right Shoulder ABduction 3+/5    Right Shoulder Internal Rotation 4+/5    Right Shoulder External Rotation 4/5                         [facility] Adult PT Treatment/Exercise - 03/07/20 0001      Shoulder Exercises: Standing   Protraction Strengthening;Right;20 reps;10 reps;Theraband    Theraband Level (Shoulder Protraction) Level 2 (Red)    External Rotation Strengthening;Right;20 reps;Theraband    Theraband Level (Shoulder External Rotation) Level 2 (Red)    Internal Rotation Strengthening;Right;20 reps;Theraband    Theraband Level (Shoulder Internal Rotation) Level 2 (Red)    ABduction Strengthening;Right;10 reps;Theraband    Theraband Level (Shoulder ABduction) Level 2 (Red)    Extension Strengthening;Right;20 reps;10 reps;Theraband    Theraband Level (Shoulder Extension) Level 2 (Red)      Shoulder Exercises: Pulleys   Flexion 3 minutes      Shoulder Exercises: ROM/Strengthening   UBE (Upper Arm Bike) 60 RPM x8 min (forward/backward)      Shoulder Exercises: Stretch   Internal Rotation Stretch 3 reps    Internal Rotation Stretch Limitations 30 sec      Modalities   Modalities Vasopneumatic      Vasopneumatic   Number Minutes Vasopneumatic  10 minutes    Vasopnuematic Location  Shoulder    Vasopneumatic Pressure Low    Vasopneumatic Temperature  34/pain                       PT Long Term Goals - 03/07/20 1658      PT LONG TERM GOAL #1   Title Independent with a HEP.    Time 8    Period Weeks    Status Achieved      PT LONG TERM GOAL #2   Title Active right shoulder flexion to 145 degrees so the patient can easily reach overhead.    Time 8    Period Weeks    Status Not Met      PT LONG TERM GOAL #3   Title Active ER to 70 degrees+ to allow for easily donning/doffing of apparel.     Time 8    Period Weeks    Status Achieved   60     PT LONG TERM GOAL #4  Title Increase ROM so patient is able to reach behind back to L4.    Time 8    Period Weeks    Status Not Met   R glute     PT LONG TERM GOAL #5   Title Increase shoulder strength to a solid 4 to 4+/5 to increase stability for performance of functional activities.    Time 8    Period Weeks    Status Partially Met      PT LONG TERM GOAL #6   Title Perform ADLs with pain not > 3/10.    Time 8    Period Weeks    Status Achieved   at most 4/10                 Patient will benefit from skilled therapeutic intervention in order to improve the following deficits and impairments:     Visit Diagnosis: Acute pain of right shoulder  Stiffness of right shoulder, not elsewhere classified     Problem List Patient Active Problem List   Diagnosis Date Noted   Sebaceous cyst    Class 2 severe obesity with serious comorbidity and body mass index (BMI) of 35.0 to 35.9 in adult ([place]) 10/27/2017   Other fatigue 09/10/2017   Shortness of breath on exertion 09/10/2017   Type 2 diabetes mellitus without complication, without long-term current use of insulin ([place]) 09/10/2017   Essential hypertension 09/10/2017   Other hyperlipidemia 09/10/2017   Other specified hypothyroidism 09/10/2017   Herniated cervical disc 08/16/2016    Standley Brooking 03/07/2020, 5:38 PM  [facility] [address] [place], [place], [address] Phone: [phone]   Fax:  [phone]  Name: AMARISS DETAMORE MRN: [id number] Date of Birth: 1955/07/30

## 2020-03-09 DIAGNOSIS — M25531 Pain in right wrist: Secondary | ICD-10-CM | POA: Diagnosis not present

## 2020-03-16 ENCOUNTER — Encounter: Payer: Self-pay | Admitting: Physical Therapy

## 2020-03-16 ENCOUNTER — Ambulatory Visit: Payer: 59 | Attending: Orthopaedic Surgery | Admitting: Physical Therapy

## 2020-03-16 ENCOUNTER — Other Ambulatory Visit: Payer: Self-pay

## 2020-03-16 DIAGNOSIS — M25611 Stiffness of right shoulder, not elsewhere classified: Secondary | ICD-10-CM | POA: Diagnosis not present

## 2020-03-16 DIAGNOSIS — M25511 Pain in right shoulder: Secondary | ICD-10-CM | POA: Insufficient documentation

## 2020-03-16 NOTE — Therapy (Signed)
[facility] [place], [place], [address] Phone: [phone]   Fax:  [phone]  Physical Therapy Treatment  Patient Details  Name: Crystal Roberts MRN: [id number] Date of Birth: 1955-10-19 Referring Provider (PT): Ophelia Charter MD   Encounter Date: 03/16/2020   PT End of Session - 03/16/20 1640    Visit Number 27    Number of Visits 32    Date for PT Re-Evaluation 04/21/20    Authorization Type FOTO.    PT Start Time 1634    PT Stop Time 1718    PT Time Calculation (min) 44 min    Activity Tolerance Patient tolerated treatment well    Behavior During Therapy WFL for tasks assessed/performed           Past Medical History:  Diagnosis Date  . Anxiety   . Arthritis    neck- cervical disc herniation   . Asthma    as child  . Back pain   . Cancer ([place])    skin - low back , insitu  melanoma   . Constipation   . COVID-19 09/07/2019   sx fever/cough/chills/lost taste and smell  . Depression   . Diabetes mellitus without complication ([place])   . Dyspnea   . Gallbladder problem   . GERD (gastroesophageal reflux disease)   . H/O cardiovascular stress test 1990   pt. reports that she was having a lot of stress & she was seen by cardiac- had stress test & told that it was negative   . Hyperlipidemia   . Hypertension   . Hypothyroidism   . Leg edema   . Pulmonary embolism ([place]) [place]. at [facility]- on blood thinning med., thought to be related to lack of activity, smoking & BCP    Past Surgical History:  Procedure Laterality Date  . ANTERIOR CERVICAL DECOMP/DISCECTOMY FUSION N/A 08/16/2016   Procedure: Cervical Four-Five Anterior cervical decompression/discectomy/fusion;  Surgeon: Erline Levine, MD;  Location: [place];  Service: Neurosurgery;  Laterality: N/A;  C4-5 Anterior cervical decompression/discectomy/fusion  . BACK SURGERY     sebaceous cyst removed  . CHOLECYSTECTOMY  6861   umbilical hernia ------ followed  cholecystectomy  . Cyst removed from back    . CYSTECTOMY Bilateral    cyst on ovaries  . HERNIA REPAIR     umbilical hernia repair   . MASS EXCISION N/A 11/21/2017   Procedure: EXCISION SEBACEOUS CYST ON BACK;  Surgeon: Aviva Signs, MD;  Location: [facility];  Service: General;  Laterality: N/A;  . SHOULDER ARTHROSCOPY WITH ROTATOR CUFF REPAIR AND SUBACROMIAL DECOMPRESSION Right 09/23/2019   Procedure: RIGHT SHOULDER ARTHROSCOPY DEBRIDEMENT, ACROMIOPLASTY, DISTAL CLAVICAL EXCISION ROTATOR CUFF REPAIR;  Surgeon: Hiram Gash, MD;  Location: [place];  Service: Orthopedics;  Laterality: Right;  . TUBAL LIGATION      There were no vitals filed for this visit.   Subjective Assessment - 03/16/20 1639    Subjective COVID 19 screening performed on patient upon arrival. Reports greater issues out of R wrist due to tendonitis but minimal R shoulder pain.    Pertinent History Hypothyroidism, DM, h/o back pain, hernia repair, SCDF, OA.    Patient Stated Goals Use right arm without difficulty.    Currently in Pain? Yes    Pain Score 1     Pain Location Shoulder    Pain Orientation Right    Pain Descriptors / Indicators Discomfort    Pain Type Surgical pain  Pain Onset More than a month ago    Pain Frequency Intermittent              [facility] PT Assessment - 03/16/20 0001      Assessment   Medical Diagnosis RT shld DCR, SAD, massive RTC repair.    Referring Provider (PT) Ophelia Charter MD    Onset Date/Surgical Date 09/23/19    Next MD Visit TBD      Precautions   Precaution Comments Follow protocol.                         [facility] Adult PT Treatment/Exercise - 03/16/20 0001      Shoulder Exercises: Standing   Protraction Strengthening;Right;20 reps;10 reps;Theraband    Theraband Level (Shoulder Protraction) Level 2 (Red)    External Rotation Strengthening;Right;20 reps;Theraband    Theraband Level (Shoulder External Rotation) Level 2 (Red)    Internal  Rotation Strengthening;Right;20 reps;Theraband    Theraband Level (Shoulder Internal Rotation) Level 2 (Red)    Internal Rotation Limitations AAROM IR x10 reps    Flexion AROM;Right;20 reps    ABduction AROM;Right;20 reps    Extension Strengthening;Right;20 reps;10 reps;Theraband    Theraband Level (Shoulder Extension) Level 2 (Red)    Extension Limitations AROM x20 reps    Diagonals AROM;Right;5 reps    Other Standing Exercises Horizontal adduction AAROM x10 reps with 5 sec holds      Shoulder Exercises: ROM/Strengthening   UBE (Upper Arm Bike) 60 RPM x8 min (forward/backward)      Modalities   Modalities Vasopneumatic      Vasopneumatic   Number Minutes Vasopneumatic  10 minutes    Vasopnuematic Location  Shoulder    Vasopneumatic Pressure Low    Vasopneumatic Temperature  34/pain                       PT Long Term Goals - 03/07/20 1658      PT LONG TERM GOAL #1   Title Independent with a HEP.    Time 8    Period Weeks    Status Achieved      PT LONG TERM GOAL #2   Title Active right shoulder flexion to 145 degrees so the patient can easily reach overhead.    Time 8    Period Weeks    Status Not Met      PT LONG TERM GOAL #3   Title Active ER to 70 degrees+ to allow for easily donning/doffing of apparel.    Time 8    Period Weeks    Status Achieved   60     PT LONG TERM GOAL #4   Title Increase ROM so patient is able to reach behind back to L4.    Time 8    Period Weeks    Status Not Met   R glute     PT LONG TERM GOAL #5   Title Increase shoulder strength to a solid 4 to 4+/5 to increase stability for performance of functional activities.    Time 8    Period Weeks    Status Partially Met      PT LONG TERM GOAL #6   Title Perform ADL's with pain not > 3/10.    Time 8    Period Weeks    Status Achieved   at most 4/10                Plan -  03/16/20 1715    Clinical Impression Statement Patient presented in clinic with minimal pain  and fatigue. Patient able to tolerate AROM and light strengthening fairly well. Shoulder elevation noted more as patient fatigues. Some trunk lean and rotation noted with AROM exercises in standing but able to limit following VCs. Normal vasopneumatic response noted following removal of the modality.    Personal Factors and Comorbidities Comorbidity 1;Comorbidity 2    Comorbidities Hypothyroidism, DM, h/o back pain, hernia repair, SCDF, OA.    Examination-Activity Limitations Reach Overhead;Other    Examination-Participation Restrictions Other    Stability/Clinical Decision Making Stable/Uncomplicated    Rehab Potential Excellent    PT Frequency 2x / week    PT Duration 8 weeks    PT Treatment/Interventions ADLs/Self Care Home Management;Cryotherapy;Electrical Stimulation;Ultrasound;Moist Heat;Therapeutic activities;Therapeutic exercise;Patient/family education;Passive range of motion;Vasopneumatic Device    PT Next Visit Plan Progress as symptoms dictate with light strengthening and AROM.    Consulted and Agree with Plan of Care Patient           Patient will benefit from skilled therapeutic intervention in order to improve the following deficits and impairments:  Pain,Decreased activity tolerance,Increased edema,Decreased range of motion  Visit Diagnosis: Acute pain of right shoulder  Stiffness of right shoulder, not elsewhere classified     Problem List Patient Active Problem List   Diagnosis Date Noted  . Sebaceous cyst   . Class 2 severe obesity with serious comorbidity and body mass index (BMI) of 35.0 to 35.9 in adult ([place]) 10/27/2017  . Other fatigue 09/10/2017  . Shortness of breath on exertion 09/10/2017  . Type 2 diabetes mellitus without complication, without long-term current use of insulin ([place]) 09/10/2017  . Essential hypertension 09/10/2017  . Other hyperlipidemia 09/10/2017  . Other specified hypothyroidism 09/10/2017  . Herniated cervical disc 08/16/2016     Standley Brooking, PTA 03/16/2020, 5:26 PM  [facility] [address] [place], [place], [address] Phone: [phone]   Fax:  [phone]  Name: Crystal Roberts MRN: [id number] Date of Birth: January 27, 1955

## 2020-03-21 ENCOUNTER — Ambulatory Visit: Payer: 59 | Admitting: Physical Therapy

## 2020-03-21 ENCOUNTER — Encounter: Payer: Self-pay | Admitting: Physical Therapy

## 2020-03-21 ENCOUNTER — Other Ambulatory Visit: Payer: Self-pay

## 2020-03-21 DIAGNOSIS — M25511 Pain in right shoulder: Secondary | ICD-10-CM | POA: Diagnosis not present

## 2020-03-21 DIAGNOSIS — M25611 Stiffness of right shoulder, not elsewhere classified: Secondary | ICD-10-CM

## 2020-03-21 NOTE — Therapy (Signed)
[facility] [place], [place], [address] Phone: [phone]   Fax:  [phone]  Physical Therapy Treatment  Patient Details  Name: Crystal Roberts MRN: [id number] Date of Birth: 10-10-1955 Referring Provider (PT): Ophelia Charter MD   Encounter Date: 03/21/2020   PT End of Session - 03/21/20 1641    Visit Number 28    Number of Visits 32    Date for PT Re-Evaluation 04/21/20    Authorization Type FOTO.    PT Start Time 1638    PT Stop Time 1715    PT Time Calculation (min) 37 min    Activity Tolerance Patient tolerated treatment well    Behavior During Therapy WFL for tasks assessed/performed           Past Medical History:  Diagnosis Date  . Anxiety   . Arthritis    neck- cervical disc herniation   . Asthma    as child  . Back pain   . Cancer ([place])    skin - low back , insitu  melanoma   . Constipation   . COVID-19 09/07/2019   sx fever/cough/chills/lost taste and smell  . Depression   . Diabetes mellitus without complication ([place])   . Dyspnea   . Gallbladder problem   . GERD (gastroesophageal reflux disease)   . H/O cardiovascular stress test 1990   pt. reports that she was having a lot of stress & she was seen by cardiac- had stress test & told that it was negative   . Hyperlipidemia   . Hypertension   . Hypothyroidism   . Leg edema   . Pulmonary embolism ([place]) [place]. at [facility]- on blood thinning med., thought to be related to lack of activity, smoking & BCP    Past Surgical History:  Procedure Laterality Date  . ANTERIOR CERVICAL DECOMP/DISCECTOMY FUSION N/A 08/16/2016   Procedure: Cervical Four-Five Anterior cervical decompression/discectomy/fusion;  Surgeon: Erline Levine, MD;  Location: [place];  Service: Neurosurgery;  Laterality: N/A;  C4-5 Anterior cervical decompression/discectomy/fusion  . BACK SURGERY     sebaceous cyst removed  . CHOLECYSTECTOMY  2951   umbilical hernia ------ followed  cholecystectomy  . Cyst removed from back    . CYSTECTOMY Bilateral    cyst on ovaries  . HERNIA REPAIR     umbilical hernia repair   . MASS EXCISION N/A 11/21/2017   Procedure: EXCISION SEBACEOUS CYST ON BACK;  Surgeon: Aviva Signs, MD;  Location: [facility];  Service: General;  Laterality: N/A;  . SHOULDER ARTHROSCOPY WITH ROTATOR CUFF REPAIR AND SUBACROMIAL DECOMPRESSION Right 09/23/2019   Procedure: RIGHT SHOULDER ARTHROSCOPY DEBRIDEMENT, ACROMIOPLASTY, DISTAL CLAVICAL EXCISION ROTATOR CUFF REPAIR;  Surgeon: Hiram Gash, MD;  Location: [place];  Service: Orthopedics;  Laterality: Right;  . TUBAL LIGATION      There were no vitals filed for this visit.   Subjective Assessment - 03/21/20 1638    Subjective COVID 19 screening performed on patient upon arrival. Reports shoulder isn't hurting much today.    Pertinent History Hypothyroidism, DM, h/o back pain, hernia repair, SCDF, OA.    Patient Stated Goals Use right arm without difficulty.    Currently in Pain? Yes    Pain Score 1     Pain Location Shoulder    Pain Orientation Right    Pain Descriptors / Indicators Discomfort    Pain Type Surgical pain    Pain Onset More than a month ago  Pain Frequency Intermittent              [facility] PT Assessment - 03/21/20 0001      Assessment   Medical Diagnosis RT shld DCR, SAD, massive RTC repair.    Referring Provider (PT) Ophelia Charter MD    Onset Date/Surgical Date 09/23/19    Next MD Visit TBD      Precautions   Precaution Comments Follow protocol.                         [place] Adult PT Treatment/Exercise - 03/21/20 0001      Shoulder Exercises: Sidelying   External Rotation Strengthening;Right;20 reps;Weights    External Rotation Weight (lbs) 2    Flexion AROM;Right;20 reps    ABduction AROM;Right;20 reps      Shoulder Exercises: Standing   Internal Rotation Limitations AAROM IR x15 reps behind the back    Flexion AROM;AAROM;Right;20 reps    rest breaks required due to fatigue; minimal assist rquired by PTA   ABduction AROM;Right;10 reps    Extension Limitations AAROM x15 reps behind back    Diagonals AROM;Right;5 reps    Other Standing Exercises Horizontal adduction AAROM x10 reps with 5 sec holds    Other Standing Exercises R bicep curl 4# x30 reps      Shoulder Exercises: ROM/Strengthening   UBE (Upper Arm Bike) 60 RPM x8 min (forward/backward)      Modalities   Modalities Vasopneumatic      Vasopneumatic   Number Minutes Vasopneumatic  10 minutes    Vasopnuematic Location  Shoulder    Vasopneumatic Pressure Low    Vasopneumatic Temperature  34/pain                       PT Long Term Goals - 03/07/20 1658      PT LONG TERM GOAL #1   Title Independent with a HEP.    Time 8    Period Weeks    Status Achieved      PT LONG TERM GOAL #2   Title Active right shoulder flexion to 145 degrees so the patient can easily reach overhead.    Time 8    Period Weeks    Status Not Met      PT LONG TERM GOAL #3   Title Active ER to 70 degrees+ to allow for easily donning/doffing of apparel.    Time 8    Period Weeks    Status Achieved   60     PT LONG TERM GOAL #4   Title Increase ROM so patient is able to reach behind back to L4.    Time 8    Period Weeks    Status Not Met   R glute     PT LONG TERM GOAL #5   Title Increase shoulder strength to a solid 4 to 4+/5 to increase stability for performance of functional activities.    Time 8    Period Weeks    Status Partially Met      PT LONG TERM GOAL #6   Title Perform ADL's with pain not > 3/10.    Time 8    Period Weeks    Status Achieved   at most 4/10                Plan - 03/21/20 1706    Clinical Impression Statement Patient presented in clinic with minimal pain but battles  fatigue intermittantly. Patient encouraged to utilize rest breaks to reduce fatigue which limits technique. Patient able to tolerate therex well although muscle  fatigue was greatest complaint. R wrist tendonitis also inflammed at this time as well. Patient has WNL ROM of R shoulder flexion and abduction in supine that is less fatiguing. Normal vasopneumatic response noted following removal of the modality.    Personal Factors and Comorbidities Comorbidity 1;Comorbidity 2    Comorbidities Hypothyroidism, DM, h/o back pain, hernia repair, SCDF, OA.    Examination-Activity Limitations Reach Overhead;Other    Examination-Participation Restrictions Other    Stability/Clinical Decision Making Stable/Uncomplicated    Rehab Potential Excellent    PT Frequency 2x / week    PT Duration 8 weeks    PT Treatment/Interventions ADLs/Self Care Home Management;Cryotherapy;Electrical Stimulation;Ultrasound;Moist Heat;Therapeutic activities;Therapeutic exercise;Patient/family education;Passive range of motion;Vasopneumatic Device    PT Next Visit Plan Progress as symptoms dictate with light strengthening and AROM.    Consulted and Agree with Plan of Care Patient           Patient will benefit from skilled therapeutic intervention in order to improve the following deficits and impairments:  Pain,Decreased activity tolerance,Increased edema,Decreased range of motion  Visit Diagnosis: Acute pain of right shoulder  Stiffness of right shoulder, not elsewhere classified     Problem List Patient Active Problem List   Diagnosis Date Noted  . Sebaceous cyst   . Class 2 severe obesity with serious comorbidity and body mass index (BMI) of 35.0 to 35.9 in adult ([place]) 10/27/2017  . Other fatigue 09/10/2017  . Shortness of breath on exertion 09/10/2017  . Type 2 diabetes mellitus without complication, without long-term current use of insulin ([place]) 09/10/2017  . Essential hypertension 09/10/2017  . Other hyperlipidemia 09/10/2017  . Other specified hypothyroidism 09/10/2017  . Herniated cervical disc 08/16/2016    Standley Brooking, PTA 03/21/2020, 5:17 PM  [facility] [place], [place], [address] Phone: [phone]   Fax:  [phone]  Name: Crystal Roberts MRN: [id number] Date of Birth: 03/23/1955

## 2020-03-23 DIAGNOSIS — M25511 Pain in right shoulder: Secondary | ICD-10-CM | POA: Diagnosis not present

## 2020-03-28 ENCOUNTER — Encounter: Payer: Self-pay | Admitting: Physical Therapy

## 2020-03-28 ENCOUNTER — Ambulatory Visit: Payer: 59 | Admitting: Physical Therapy

## 2020-03-28 ENCOUNTER — Other Ambulatory Visit: Payer: Self-pay

## 2020-03-28 DIAGNOSIS — M25611 Stiffness of right shoulder, not elsewhere classified: Secondary | ICD-10-CM

## 2020-03-28 DIAGNOSIS — M25511 Pain in right shoulder: Secondary | ICD-10-CM | POA: Diagnosis not present

## 2020-03-28 NOTE — Therapy (Signed)
[facility] [place], [place], [address] Phone: [phone]   Fax:  [phone]  Physical Therapy Treatment  Patient Details  Name: Crystal Roberts MRN: [id number] Date of Birth: [birth year] Referring Provider (PT): Ophelia Charter MD   Encounter Date: 03/28/2020   PT End of Session - 03/28/20 1657    Visit Number 29    Number of Visits 32    Date for PT Re-Evaluation 04/21/20    Authorization Type FOTO.    PT Start Time 1648    PT Stop Time 1726    PT Time Calculation (min) 38 min    Activity Tolerance Patient tolerated treatment well    Behavior During Therapy WFL for tasks assessed/performed           Past Medical History:  Diagnosis Date  . Anxiety   . Arthritis    neck- cervical disc herniation   . Asthma    as child  . Back pain   . Cancer ([place])    skin - low back , insitu  melanoma   . Constipation   . COVID-19 09/07/2019   sx fever/cough/chills/lost taste and smell  . Depression   . Diabetes mellitus without complication ([place])   . Dyspnea   . Gallbladder problem   . GERD (gastroesophageal reflux disease)   . H/O cardiovascular stress test 1990   pt. reports that she was having a lot of stress & she was seen by cardiac- had stress test & told that it was negative   . Hyperlipidemia   . Hypertension   . Hypothyroidism   . Leg edema   . Pulmonary embolism ([place]) [place]. at [facility]- on blood thinning med., thought to be related to lack of activity, smoking & BCP    Past Surgical History:  Procedure Laterality Date  . ANTERIOR CERVICAL DECOMP/DISCECTOMY FUSION N/A 08/16/2016   Procedure: Cervical Four-Five Anterior cervical decompression/discectomy/fusion;  Surgeon: Erline Levine, MD;  Location: [place];  Service: Neurosurgery;  Laterality: N/A;  C4-5 Anterior cervical decompression/discectomy/fusion  . BACK SURGERY     sebaceous cyst removed  . CHOLECYSTECTOMY  7124   umbilical hernia ------ followed  cholecystectomy  . Cyst removed from back    . CYSTECTOMY Bilateral    cyst on ovaries  . HERNIA REPAIR     umbilical hernia repair   . MASS EXCISION N/A 11/21/2017   Procedure: EXCISION SEBACEOUS CYST ON BACK;  Surgeon: Aviva Signs, MD;  [facility];  Service: General;  Laterality: N/A;  . SHOULDER ARTHROSCOPY WITH ROTATOR CUFF REPAIR AND SUBACROMIAL DECOMPRESSION Right 09/23/2019   Procedure: RIGHT SHOULDER ARTHROSCOPY DEBRIDEMENT, ACROMIOPLASTY, DISTAL CLAVICAL EXCISION ROTATOR CUFF REPAIR;  Surgeon: Hiram Gash, MD;  Location: [place];  Service: Orthopedics;  Laterality: Right;  . TUBAL LIGATION      There were no vitals filed for this visit.   Subjective Assessment - 03/28/20 1657    Subjective COVID 19 screening performed on patient upon arrival. Reports shoulder isn't hurting much today "maybe a half a point."    Pertinent History Hypothyroidism, DM, h/o back pain, hernia repair, SCDF, OA.    Patient Stated Goals Use right arm without difficulty.    Currently in Pain? Yes    Pain Score --   "half a point"   Pain Location Shoulder    Pain Orientation Right    Pain Descriptors / Indicators Other (Comment)   "twinge"   Pain Type Surgical pain  Pain Onset More than a month ago    Pain Frequency Intermittent              [facility] PT Assessment - 03/28/20 0001      Assessment   Medical Diagnosis RT shld DCR, SAD, massive RTC repair.    Referring Provider (PT) Ophelia Charter MD    Onset Date/Surgical Date 09/23/19    Next MD Visit TBD      Precautions   Precaution Comments Follow protocol.                         [facility] Adult PT Treatment/Exercise - 03/28/20 0001      Elbow Exercises   Elbow Flexion Strengthening;Right;20 reps;Seated;Bar weights/barbell    Bar Weights/Barbell (Elbow Flexion) 4 lbs    Elbow Flexion Limitations 3D    Elbow Extension Strengthening;Right;20 reps;Standing;Bar weights/barbell    Bar Weights/Barbell (Elbow  Extension) 4 lbs      Shoulder Exercises: Seated   Flexion AROM;Right;20 reps    Abduction AROM;Right;20 reps    Other Seated Exercises R AROM scaption 2x10 reps      Shoulder Exercises: Standing   Diagonals AROM;Right;10 reps      Shoulder Exercises: Pulleys   Flexion 3 minutes      Shoulder Exercises: ROM/Strengthening   UBE (Upper Arm Bike) 30 RPM x8 min (forward/backward)      Modalities   Modalities Vasopneumatic      Vasopneumatic   Number Minutes Vasopneumatic  10 minutes    Vasopnuematic Location  Shoulder    Vasopneumatic Pressure Low    Vasopneumatic Temperature  34/post therex recovery                       PT Long Term Goals - 03/07/20 1658      PT LONG TERM GOAL #1   Title Independent with a HEP.    Time 8    Period Weeks    Status Achieved      PT LONG TERM GOAL #2   Title Active right shoulder flexion to 145 degrees so the patient can easily reach overhead.    Time 8    Period Weeks    Status Not Met      PT LONG TERM GOAL #3   Title Active ER to 70 degrees+ to allow for easily donning/doffing of apparel.    Time 8    Period Weeks    Status Achieved   60     PT LONG TERM GOAL #4   Title Increase ROM so patient is able to reach behind back to L4.    Time 8    Period Weeks    Status Not Met   R glute     PT LONG TERM GOAL #5   Title Increase shoulder strength to a solid 4 to 4+/5 to increase stability for performance of functional activities.    Time 8    Period Weeks    Status Partially Met      PT LONG TERM GOAL #6   Title Perform ADL's with pain not > 3/10.    Time 8    Period Weeks    Status Achieved   at most 4/10                Plan - 03/28/20 1716    Clinical Impression Statement Patient presented in clinic with reports with very minimal R shoulder discomfort which is still  very intermittant as well. Greatest limitation at this time continues to be muscle fatigue. Patient able to tolerate AROM shoulder  flexion, scaption and abduction with greater functional ROM and strength today. Rest breaks were utilized throughout therex session due to continued muscle fatigue. Normal vasopnuematic response noted following removal to assist with post therex soreness.    Personal Factors and Comorbidities Comorbidity 1;Comorbidity 2    Comorbidities Hypothyroidism, DM, h/o back pain, hernia repair, SCDF, OA.    Examination-Activity Limitations Reach Overhead;Other    Examination-Participation Restrictions Other    Stability/Clinical Decision Making Stable/Uncomplicated    Rehab Potential Excellent    PT Frequency 2x / week    PT Duration 8 weeks    PT Treatment/Interventions ADLs/Self Care Home Management;Cryotherapy;Electrical Stimulation;Ultrasound;Moist Heat;Therapeutic activities;Therapeutic exercise;Patient/family education;Passive range of motion;Vasopneumatic Device    PT Next Visit Plan Progress as symptoms dictate with light strengthening and AROM.    Consulted and Agree with Plan of Care Patient           Patient will benefit from skilled therapeutic intervention in order to improve the following deficits and impairments:  Pain,Decreased activity tolerance,Increased edema,Decreased range of motion  Visit Diagnosis: Acute pain of right shoulder  Stiffness of right shoulder, not elsewhere classified     Problem List Patient Active Problem List   Diagnosis Date Noted  . Sebaceous cyst   . Class 2 severe obesity with serious comorbidity and body mass index (BMI) of 35.0 to 35.9 in adult ([place]) 10/27/2017  . Other fatigue 09/10/2017  . Shortness of breath on exertion 09/10/2017  . Type 2 diabetes mellitus without complication, without long-term current use of insulin ([place]) 09/10/2017  . Essential hypertension 09/10/2017  . Other hyperlipidemia 09/10/2017  . Other specified hypothyroidism 09/10/2017  . Herniated cervical disc 08/16/2016    Standley Brooking, PTA 03/28/2020, 5:37  PM  [facility] [address] [place], [place], [address] Phone: [phone]   Fax:  [phone]  Name: Crystal Roberts MRN: [id number] Date of Birth: [birth year]

## 2020-04-04 ENCOUNTER — Ambulatory Visit: Payer: 59 | Admitting: Physical Therapy

## 2020-04-04 ENCOUNTER — Encounter: Payer: Self-pay | Admitting: Physical Therapy

## 2020-04-04 ENCOUNTER — Other Ambulatory Visit: Payer: Self-pay

## 2020-04-04 DIAGNOSIS — M25611 Stiffness of right shoulder, not elsewhere classified: Secondary | ICD-10-CM | POA: Diagnosis not present

## 2020-04-04 DIAGNOSIS — M25511 Pain in right shoulder: Secondary | ICD-10-CM | POA: Diagnosis not present

## 2020-04-04 NOTE — Therapy (Signed)
[facility] [place], [place], [address] Phone: [phone]   Fax:  [phone]  Physical Therapy Treatment Progress Note Reporting Period 01/25/2020 to 04/04/2020  See note below for Objective Data and Assessment of Progress/Goals. Patient goals partially met at this time; still with limitations secondary to muscle fatigue. Gabriela Eves, PT, DPT      Patient Details  Name: Crystal Roberts MRN: [id number] Date of Birth: 15-Apr-1955 Referring Provider (PT): Ophelia Charter MD   Encounter Date: 04/04/2020   PT End of Session - 04/04/20 1649    Visit Number 30    Number of Visits 32    Date for PT Re-Evaluation 04/21/20    Authorization Type FOTO.    PT Start Time 1638    PT Stop Time 1723    PT Time Calculation (min) 45 min    Activity Tolerance Patient tolerated treatment well    Behavior During Therapy WFL for tasks assessed/performed           Past Medical History:  Diagnosis Date  . Anxiety   . Arthritis    neck- cervical disc herniation   . Asthma    as child  . Back pain   . Cancer ([place])    skin - low back , insitu  melanoma   . Constipation   . COVID-19 09/07/2019   sx fever/cough/chills/lost taste and smell  . Depression   . Diabetes mellitus without complication ([place])   . Dyspnea   . Gallbladder problem   . GERD (gastroesophageal reflux disease)   . H/O cardiovascular stress test 1990   pt. reports that she was having a lot of stress & she was seen by cardiac- had stress test & told that it was negative   . Hyperlipidemia   . Hypertension   . Hypothyroidism   . Leg edema   . Pulmonary embolism ([place]) [place]. at [facility]- on blood thinning med., thought to be related to lack of activity, smoking & BCP    Past Surgical History:  Procedure Laterality Date  . ANTERIOR CERVICAL DECOMP/DISCECTOMY FUSION N/A 08/16/2016   Procedure: Cervical Four-Five Anterior cervical decompression/discectomy/fusion;   Surgeon: Erline Levine, MD;  Location: [facility];  Service: Neurosurgery;  Laterality: N/A;  C4-5 Anterior cervical decompression/discectomy/fusion  . BACK SURGERY     sebaceous cyst removed  . CHOLECYSTECTOMY  3888   umbilical hernia ------ followed cholecystectomy  . Cyst removed from back    . CYSTECTOMY Bilateral    cyst on ovaries  . HERNIA REPAIR     umbilical hernia repair   . MASS EXCISION N/A 11/21/2017   Procedure: EXCISION SEBACEOUS CYST ON BACK;  Surgeon: Aviva Signs, MD;  Location: [facility];  Service: General;  Laterality: N/A;  . SHOULDER ARTHROSCOPY WITH ROTATOR CUFF REPAIR AND SUBACROMIAL DECOMPRESSION Right 09/23/2019   Procedure: RIGHT SHOULDER ARTHROSCOPY DEBRIDEMENT, ACROMIOPLASTY, DISTAL CLAVICAL EXCISION ROTATOR CUFF REPAIR;  Surgeon: Hiram Gash, MD;  Location: [facility];  Service: Orthopedics;  Laterality: Right;  . TUBAL LIGATION      There were no vitals filed for this visit.   Subjective Assessment - 04/04/20 1648    Subjective COVID 19 screening performed on patient upon arrival. Reports shoulder isn't hurting much today "maybe a half a point." fatigued after working hard today.    Pertinent History Hypothyroidism, DM, h/o back pain, hernia repair, SCDF, OA.    Patient Stated Goals Use right arm without difficulty.  Currently in Pain? Yes    Pain Score --   "half a point"   Pain Location Shoulder    Pain Orientation Right    Pain Descriptors / Indicators Discomfort    Pain Type Surgical pain    Pain Onset More than a month ago    Pain Frequency Intermittent              [facility] PT Assessment - 04/04/20 0001      Assessment   Medical Diagnosis RT shld DCR, SAD, massive RTC repair.    Referring Provider (PT) Ophelia Charter MD    Onset Date/Surgical Date 09/23/19    Next MD Visit TBD      Precautions   Precaution Comments Follow protocol.                         [facility] Adult PT Treatment/Exercise - 04/04/20 0001       Elbow Exercises   Elbow Flexion Strengthening;Right;20 reps;Seated;Bar weights/barbell    Bar Weights/Barbell (Elbow Flexion) 4 lbs    Elbow Flexion Limitations 3D      Shoulder Exercises: Seated   Flexion Strengthening;Right;20 reps;10 reps;Weights    Flexion Weight (lbs) 1    Abduction Strengthening;Right;20 reps;10 reps;Weights    ABduction Weight (lbs) 1    Other Seated Exercises R shoulder scaption 1# 3x10 reps      Shoulder Exercises: Sidelying   External Rotation Strengthening;Right;20 reps;10 reps;Weights    External Rotation Weight (lbs) 3    Flexion Strengthening;Right;20 reps;10 reps;Weights    Flexion Weight (lbs) 1      Shoulder Exercises: Standing   Row Strengthening;Both;20 reps;Theraband    Theraband Level (Shoulder Row) Level 3 (Green)      Shoulder Exercises: ROM/Strengthening   UBE (Upper Arm Bike) 30 RPM x8 min (forward/backward)      Modalities   Modalities Vasopneumatic      Vasopneumatic   Number Minutes Vasopneumatic  10 minutes    Vasopnuematic Location  Shoulder    Vasopneumatic Pressure Low    Vasopneumatic Temperature  34/post therex recovery                       PT Long Term Goals - 03/07/20 1658      PT LONG TERM GOAL #1   Title Independent with a HEP.    Time 8    Period Weeks    Status Achieved      PT LONG TERM GOAL #2   Title Active right shoulder flexion to 145 degrees so the patient can easily reach overhead.    Time 8    Period Weeks    Status Not Met      PT LONG TERM GOAL #3   Title Active ER to 70 degrees+ to allow for easily donning/doffing of apparel.    Time 8    Period Weeks    Status Achieved   60     PT LONG TERM GOAL #4   Title Increase ROM so patient is able to reach behind back to L4.    Time 8    Period Weeks    Status Not Met   R glute     PT LONG TERM GOAL #5   Title Increase shoulder strength to a solid 4 to 4+/5 to increase stability for performance of functional activities.    Time 8     Period Weeks    Status Partially Met  PT LONG TERM GOAL #6   Title Perform ADL's with pain not > 3/10.    Time 8    Period Weeks    Status Achieved   at most 4/10                Plan - 04/04/20 1748    Clinical Impression Statement Patient presented in clinic with reports of very minimal R shoulder discomfort. Patient able to demonstrate improved R shoulder strengthening with light resistance. Mod R shoulder elevation noted with resisted shoulder abduction and patient was progressed row strengthening for posterior positioning. Rest breaks required during therex due to fatigue but able to tolerate greater reps today. Normal vasopneumatic response noted following removal of the modality.    Personal Factors and Comorbidities Comorbidity 1;Comorbidity 2    Comorbidities Hypothyroidism, DM, h/o back pain, hernia repair, SCDF, OA.    Examination-Activity Limitations Reach Overhead;Other    Examination-Participation Restrictions Other    Stability/Clinical Decision Making Stable/Uncomplicated    Rehab Potential Excellent    PT Frequency 2x / week    PT Duration 8 weeks    PT Treatment/Interventions ADLs/Self Care Home Management;Cryotherapy;Electrical Stimulation;Ultrasound;Moist Heat;Therapeutic activities;Therapeutic exercise;Patient/family education;Passive range of motion;Vasopneumatic Device    PT Next Visit Plan Progress as symptoms dictate with light strengthening and AROM. FOTO at 31st visit.    Consulted and Agree with Plan of Care Patient           Patient will benefit from skilled therapeutic intervention in order to improve the following deficits and impairments:  Pain,Decreased activity tolerance,Increased edema,Decreased range of motion  Visit Diagnosis: Acute pain of right shoulder  Stiffness of right shoulder, not elsewhere classified     Problem List Patient Active Problem List   Diagnosis Date Noted  . Sebaceous cyst   . Class 2 severe obesity with  serious comorbidity and body mass index (BMI) of 35.0 to 35.9 in adult ([place]) 10/27/2017  . Other fatigue 09/10/2017  . Shortness of breath on exertion 09/10/2017  . Type 2 diabetes mellitus without complication, without long-term current use of insulin ([place]) 09/10/2017  . Essential hypertension 09/10/2017  . Other hyperlipidemia 09/10/2017  . Other specified hypothyroidism 09/10/2017  . Herniated cervical disc 08/16/2016    Standley Brooking, PTA 04/04/2020, 5:54 PM  [facility] [place], [place], [address] Phone: [phone]   Fax:  [phone]  Name: VIBHA FERDIG MRN: [id number] Date of Birth: [birth year]

## 2020-04-06 MED FILL — METFORMIN HCL 500 MG TABS: 500 | 90 days supply | Qty: 90 | Fill #1

## 2020-04-11 ENCOUNTER — Encounter: Payer: 59 | Admitting: Physical Therapy

## 2020-04-18 ENCOUNTER — Ambulatory Visit: Payer: 59 | Admitting: Physical Therapy

## 2020-04-18 DIAGNOSIS — G5601 Carpal tunnel syndrome, right upper limb: Secondary | ICD-10-CM | POA: Diagnosis not present

## 2020-04-18 DIAGNOSIS — M25531 Pain in right wrist: Secondary | ICD-10-CM | POA: Diagnosis not present

## 2020-04-23 DIAGNOSIS — M25511 Pain in right shoulder: Secondary | ICD-10-CM | POA: Diagnosis not present

## 2020-04-25 ENCOUNTER — Encounter: Payer: Self-pay | Admitting: Physical Therapy

## 2020-04-25 ENCOUNTER — Ambulatory Visit: Payer: 59 | Attending: Orthopaedic Surgery | Admitting: Physical Therapy

## 2020-04-25 ENCOUNTER — Other Ambulatory Visit: Payer: Self-pay

## 2020-04-25 ENCOUNTER — Other Ambulatory Visit (HOSPITAL_COMMUNITY): Payer: Self-pay

## 2020-04-25 DIAGNOSIS — M25611 Stiffness of right shoulder, not elsewhere classified: Secondary | ICD-10-CM | POA: Insufficient documentation

## 2020-04-25 DIAGNOSIS — M25511 Pain in right shoulder: Secondary | ICD-10-CM | POA: Diagnosis not present

## 2020-04-25 NOTE — Therapy (Signed)
[facility] [place], [place], [address] Phone: [phone]   Fax:  [phone]  Physical Therapy Treatment  Patient Details  Name: Crystal Roberts MRN: [id number] Date of Birth: [birth year] Referring Provider (PT): Ophelia Charter MD   Encounter Date: 04/25/2020   PT End of Session - 04/25/20 1653    Visit Number 31    Number of Visits 32    Date for PT Re-Evaluation 04/21/20    Authorization Type FOTO.    PT Start Time 1647    PT Stop Time 1725    PT Time Calculation (min) 38 min    Activity Tolerance Patient tolerated treatment well    Behavior During Therapy WFL for tasks assessed/performed           Past Medical History:  Diagnosis Date  . Anxiety   . Arthritis    neck- cervical disc herniation   . Asthma    as child  . Back pain   . Cancer ([place])    skin - low back , insitu  melanoma   . Constipation   . COVID-19 09/07/2019   sx fever/cough/chills/lost taste and smell  . Depression   . Diabetes mellitus without complication ([place])   . Dyspnea   . Gallbladder problem   . GERD (gastroesophageal reflux disease)   . H/O cardiovascular stress test 1990   pt. reports that she was having a lot of stress & she was seen by cardiac- had stress test & told that it was negative   . Hyperlipidemia   . Hypertension   . Hypothyroidism   . Leg edema   . Pulmonary embolism ([place]) [place]. at [facility]- on blood thinning med., thought to be related to lack of activity, smoking & BCP    Past Surgical History:  Procedure Laterality Date  . ANTERIOR CERVICAL DECOMP/DISCECTOMY FUSION N/A 08/16/2016   Procedure: Cervical Four-Five Anterior cervical decompression/discectomy/fusion;  Surgeon: Erline Levine, MD;  Location: [facility];  Service: Neurosurgery;  Laterality: N/A;  C4-5 Anterior cervical decompression/discectomy/fusion  . BACK SURGERY     sebaceous cyst removed  . CHOLECYSTECTOMY  9201   umbilical hernia ------ followed  cholecystectomy  . Cyst removed from back    . CYSTECTOMY Bilateral    cyst on ovaries  . HERNIA REPAIR     umbilical hernia repair   . MASS EXCISION N/A 11/21/2017   Procedure: EXCISION SEBACEOUS CYST ON BACK;  Surgeon: Aviva Signs, MD;  Location: [facility];  Service: General;  Laterality: N/A;  . SHOULDER ARTHROSCOPY WITH ROTATOR CUFF REPAIR AND SUBACROMIAL DECOMPRESSION Right 09/23/2019   Procedure: RIGHT SHOULDER ARTHROSCOPY DEBRIDEMENT, ACROMIOPLASTY, DISTAL CLAVICAL EXCISION ROTATOR CUFF REPAIR;  Surgeon: Hiram Gash, MD;  Location: [facility];  Service: Orthopedics;  Laterality: Right;  . TUBAL LIGATION      There were no vitals filed for this visit.   Subjective Assessment - 04/25/20 1649    Subjective COVID 19 screening performed on patient upon arrival. Reports pain depends on day and time but has been doing much better recently.    Pertinent History Hypothyroidism, DM, h/o back pain, hernia repair, SCDF, OA.    Patient Stated Goals Use right arm without difficulty.    Currently in Pain? Yes    Pain Score 1     Pain Location Shoulder    Pain Orientation Right    Pain Descriptors / Indicators Discomfort    Pain Type Surgical pain  Pain Onset More than a month ago    Pain Frequency Intermittent              [facility] PT Assessment - 04/25/20 0001      Assessment   Medical Diagnosis RT shld DCR, SAD, massive RTC repair.    Referring Provider (PT) Ophelia Charter MD    Onset Date/Surgical Date 09/23/19    Next MD Visit TBD      Precautions   Precaution Comments Follow protocol.      ROM / Strength   AROM / PROM / Strength AROM      AROM   Overall AROM  Within functional limits for tasks performed    AROM Assessment Site Shoulder    Right/Left Shoulder Right    Right Shoulder Flexion 145 Degrees    Right Shoulder External Rotation 73 Degrees                         [facility] Adult PT Treatment/Exercise - 04/25/20 0001      Shoulder  Exercises: Sidelying   Flexion Strengthening;Right;20 reps;10 reps;Weights    Flexion Weight (lbs) 1      Shoulder Exercises: Standing   Horizontal ABduction Strengthening;Both;20 reps;Theraband    Theraband Level (Shoulder Horizontal ABduction) Level 1 (Yellow)    Flexion Strengthening;Right;20 reps;Weights    Shoulder Flexion Weight (lbs) 2    ABduction Strengthening;Right;20 reps;Weights    Shoulder ABduction Weight (lbs) 2    Row Strengthening;Both;20 reps;Theraband    Theraband Level (Shoulder Row) Level 2 (Red)      Shoulder Exercises: ROM/Strengthening   UBE (Upper Arm Bike) 30 RPM x8 min (forward/backward)    Wall [place] CW, CCW xfatigue      Modalities   Modalities Vasopneumatic      Vasopneumatic   Number Minutes Vasopneumatic  10 minutes    Vasopnuematic Location  Shoulder    Vasopneumatic Pressure Low    Vasopneumatic Temperature  34/post therex recovery                       PT Long Term Goals - 04/25/20 1656      PT LONG TERM GOAL #1   Title Independent with a HEP.    Time 8    Period Weeks    Status Achieved      PT LONG TERM GOAL #2   Title Active right shoulder flexion to 145 degrees so the patient can easily reach overhead.    Time 8    Period Weeks    Status Achieved      PT LONG TERM GOAL #3   Title Active ER to 70 degrees+ to allow for easily donning/doffing of apparel.    Time 8    Period Weeks    Status Achieved   60     PT LONG TERM GOAL #4   Title Increase ROM so patient is able to reach behind back to L4.    Time 8    Period Weeks    Status Not Met   R glute     PT LONG TERM GOAL #5   Title Increase shoulder strength to a solid 4 to 4+/5 to increase stability for performance of functional activities.    Time 8    Period Weeks    Status Partially Met      PT LONG TERM GOAL #6   Title Perform ADL's with pain not > 3/10.  Time 8    Period Weeks    Status Achieved   at most 4/10                Plan -  04/25/20 1723    Clinical Impression Statement Patient presented in clinic with very minimal pain which ranges due to activity. Patient able to tolerate therex well but does experience fatigue by the end of the session. AROM of R shoulder has improved as well and able to achieve both LTGs for ROM. Tactile and VCs required to avoid shoulder elevation during shoulder flexion. Normal vasopneumatic response noted following removal of the modality.    Personal Factors and Comorbidities Comorbidity 1;Comorbidity 2    Comorbidities Hypothyroidism, DM, h/o back pain, hernia repair, SCDF, OA.    Examination-Activity Limitations Reach Overhead;Other    Examination-Participation Restrictions Other    Stability/Clinical Decision Making Stable/Uncomplicated    Rehab Potential Excellent    PT Frequency 2x / week    PT Duration 8 weeks    PT Treatment/Interventions ADLs/Self Care Home Management;Cryotherapy;Electrical Stimulation;Ultrasound;Moist Heat;Therapeutic activities;Therapeutic exercise;Patient/family education;Passive range of motion;Vasopneumatic Device    PT Next Visit Plan Progress as symptoms dictate with light strengthening and AROM. FOTO at 32st visit.    Consulted and Agree with Plan of Care Patient           Patient will benefit from skilled therapeutic intervention in order to improve the following deficits and impairments:  Pain,Decreased activity tolerance,Increased edema,Decreased range of motion  Visit Diagnosis: Acute pain of right shoulder  Stiffness of right shoulder, not elsewhere classified     Problem List Patient Active Problem List   Diagnosis Date Noted  . Sebaceous cyst   . Class 2 severe obesity with serious comorbidity and body mass index (BMI) of 35.0 to 35.9 in adult ([place]) 10/27/2017  . Other fatigue 09/10/2017  . Shortness of breath on exertion 09/10/2017  . Type 2 diabetes mellitus without complication, without long-term current use of insulin ([place])  09/10/2017  . Essential hypertension 09/10/2017  . Other hyperlipidemia 09/10/2017  . Other specified hypothyroidism 09/10/2017  . Herniated cervical disc 08/16/2016    Standley Brooking, PTA 04/25/2020, 5:42 PM  [facility] [place], [place], [address] Phone: [phone]   Fax:  [phone]  Name: MYKAELA ARENA MRN: [id number] Date of Birth: 03/05/1955

## 2020-05-02 ENCOUNTER — Ambulatory Visit: Payer: 59 | Admitting: Physical Therapy

## 2020-05-02 ENCOUNTER — Other Ambulatory Visit: Payer: Self-pay

## 2020-05-02 ENCOUNTER — Encounter: Payer: Self-pay | Admitting: Physical Therapy

## 2020-05-02 DIAGNOSIS — M25511 Pain in right shoulder: Secondary | ICD-10-CM

## 2020-05-02 DIAGNOSIS — M25611 Stiffness of right shoulder, not elsewhere classified: Secondary | ICD-10-CM

## 2020-05-02 NOTE — Therapy (Signed)
[facility] [place], [place], [address] Phone: [phone]   Fax:  [phone]  Physical Therapy Treatment  Patient Details  Name: Crystal Roberts MRN: [id number] Date of Birth: February 13, 1955 Referring Provider (PT): Ophelia Charter MD   Encounter Date: 05/02/2020   PT End of Session - 05/02/20 1711    Visit Number 32    Number of Visits 32    Date for PT Re-Evaluation 04/21/20    Authorization Type FOTO.    PT Start Time 1638    PT Stop Time 1718    PT Time Calculation (min) 40 min    Activity Tolerance Patient tolerated treatment well    Behavior During Therapy WFL for tasks assessed/performed           Past Medical History:  Diagnosis Date  . Anxiety   . Arthritis    neck- cervical disc herniation   . Asthma    as child  . Back pain   . Cancer ([place])    skin - low back , insitu  melanoma   . Constipation   . COVID-19 09/07/2019   sx fever/cough/chills/lost taste and smell  . Depression   . Diabetes mellitus without complication ([place])   . Dyspnea   . Gallbladder problem   . GERD (gastroesophageal reflux disease)   . H/O cardiovascular stress test 1990   pt. reports that she was having a lot of stress & she was seen by cardiac- had stress test & told that it was negative   . Hyperlipidemia   . Hypertension   . Hypothyroidism   . Leg edema   . Pulmonary embolism ([place]) [place]. at [facility]- on blood thinning med., thought to be related to lack of activity, smoking & BCP    Past Surgical History:  Procedure Laterality Date  . ANTERIOR CERVICAL DECOMP/DISCECTOMY FUSION N/A 08/16/2016   Procedure: Cervical Four-Five Anterior cervical decompression/discectomy/fusion;  Surgeon: Erline Levine, MD;  Location: [place];  Service: Neurosurgery;  Laterality: N/A;  C4-5 Anterior cervical decompression/discectomy/fusion  . BACK SURGERY     sebaceous cyst removed  . CHOLECYSTECTOMY  6389   umbilical hernia ------ followed  cholecystectomy  . Cyst removed from back    . CYSTECTOMY Bilateral    cyst on ovaries  . HERNIA REPAIR     umbilical hernia repair   . MASS EXCISION N/A 11/21/2017   Procedure: EXCISION SEBACEOUS CYST ON BACK;  Surgeon: Aviva Signs, MD;  Location: [facility];  Service: General;  Laterality: N/A;  . SHOULDER ARTHROSCOPY WITH ROTATOR CUFF REPAIR AND SUBACROMIAL DECOMPRESSION Right 09/23/2019   Procedure: RIGHT SHOULDER ARTHROSCOPY DEBRIDEMENT, ACROMIOPLASTY, DISTAL CLAVICAL EXCISION ROTATOR CUFF REPAIR;  Surgeon: Hiram Gash, MD;  Location: [place];  Service: Orthopedics;  Laterality: Right;  . TUBAL LIGATION      There were no vitals filed for this visit.   Subjective Assessment - 05/02/20 1649    Subjective COVID 19 screening performed on patient upon arrival. Reports fatigue from long day. Minimal soreness after thinking she may have slept wrong.    Pertinent History Hypothyroidism, DM, h/o back pain, hernia repair, SCDF, OA.    Patient Stated Goals Use right arm without difficulty.    Currently in Pain? Yes    Pain Score 1     Pain Location Shoulder    Pain Orientation Right    Pain Descriptors / Indicators Sore    Pain Type Surgical pain  Pain Onset Today    Pain Frequency Intermittent              [facility] PT Assessment - 05/02/20 0001      Assessment   Medical Diagnosis RT shld DCR, SAD, massive RTC repair.    Referring Provider (PT) Ophelia Charter MD    Onset Date/Surgical Date 09/23/19    Next MD Visit TBD      Precautions   Precaution Comments Follow protocol.                         [place] Adult PT Treatment/Exercise - 05/02/20 0001      Shoulder Exercises: Sidelying   External Rotation Strengthening;Right;20 reps;10 reps;Weights    External Rotation Weight (lbs) 3    Flexion Strengthening;Right;20 reps;10 reps;Weights    Flexion Weight (lbs) 1      Shoulder Exercises: Standing   Flexion Strengthening;Right;20 reps;Weights     Shoulder Flexion Weight (lbs) 1    ABduction Strengthening;Right;20 reps;Weights    Shoulder ABduction Weight (lbs) 1    Extension Strengthening;Right;20 reps;10 reps;Theraband    Theraband Level (Shoulder Extension) Level 3 (Green)    Row Strengthening;Both;20 reps;Theraband    Theraband Level (Shoulder Row) Level 3 (Green)    Other Standing Exercises Abduction walk outs B x15 reps yellow theraband    Other Standing Exercises R shoulder scaption 1# x20 reps      Shoulder Exercises: ROM/Strengthening   UBE (Upper Arm Bike) 60 RPM x10 min    Wall Pushups 10 reps    Other ROM/Strengthening Exercises wall slides in ER yellow theraband x15 reps      Modalities   Modalities Vasopneumatic      Vasopneumatic   Number Minutes Vasopneumatic  10 minutes    Vasopnuematic Location  Shoulder    Vasopneumatic Pressure Low    Vasopneumatic Temperature  34/discomfort                       PT Long Term Goals - 04/25/20 1656      PT LONG TERM GOAL #1   Title Independent with a HEP.    Time 8    Period Weeks    Status Achieved      PT LONG TERM GOAL #2   Title Active right shoulder flexion to 145 degrees so the patient can easily reach overhead.    Time 8    Period Weeks    Status Achieved      PT LONG TERM GOAL #3   Title Active ER to 70 degrees+ to allow for easily donning/doffing of apparel.    Time 8    Period Weeks    Status Achieved   60     PT LONG TERM GOAL #4   Title Increase ROM so patient is able to reach behind back to L4.    Time 8    Period Weeks    Status Not Met   R glute     PT LONG TERM GOAL #5   Title Increase shoulder strength to a solid 4 to 4+/5 to increase stability for performance of functional activities.    Time 8    Period Weeks    Status Partially Met      PT LONG TERM GOAL #6   Title Perform ADL's with pain not > 3/10.    Time 8    Period Weeks    Status Achieved   at most  4/10                Plan - 05/02/20 1738     Clinical Impression Statement Patient presented in clinic with reports of minimal shoulder soreness after potentially sleeping in close fetal position. Patient progressed through lightly resisted exercises with no complaints of pain. Some muscle fatigued reported following antigravity, resisted therex. Less shoulder elevation noted during standing strengthening exercises. Normal vasopneumatic response noted following removal of the modality.    Personal Factors and Comorbidities Comorbidity 1;Comorbidity 2    Comorbidities Hypothyroidism, DM, h/o back pain, hernia repair, SCDF, OA.    Examination-Activity Limitations Reach Overhead;Other    Examination-Participation Restrictions Other    Stability/Clinical Decision Making Stable/Uncomplicated    Rehab Potential Excellent    PT Frequency 2x / week    PT Duration 8 weeks    PT Treatment/Interventions ADLs/Self Care Home Management;Cryotherapy;Electrical Stimulation;Ultrasound;Moist Heat;Therapeutic activities;Therapeutic exercise;Patient/family education;Passive range of motion;Vasopneumatic Device    PT Next Visit Plan Progress as symptoms dictate with light strengthening and AROM. FOTO at 32st visit.    Consulted and Agree with Plan of Care Patient           Patient will benefit from skilled therapeutic intervention in order to improve the following deficits and impairments:  Pain,Decreased activity tolerance,Increased edema,Decreased range of motion  Visit Diagnosis: Acute pain of right shoulder  Stiffness of right shoulder, not elsewhere classified     Problem List Patient Active Problem List   Diagnosis Date Noted  . Sebaceous cyst   . Class 2 severe obesity with serious comorbidity and body mass index (BMI) of 35.0 to 35.9 in adult ([place]) 10/27/2017  . Other fatigue 09/10/2017  . Shortness of breath on exertion 09/10/2017  . Type 2 diabetes mellitus without complication, without long-term current use of insulin ([place]) 09/10/2017   . Essential hypertension 09/10/2017  . Other hyperlipidemia 09/10/2017  . Other specified hypothyroidism 09/10/2017  . Herniated cervical disc 08/16/2016    Standley Brooking, PTA 05/02/2020, 5:49 PM  [facility] [place], [place], [address] Phone: [phone]   Fax:  [phone]  Name: Crystal Roberts MRN: [id number] Date of Birth: Jun 19, 1955

## 2020-05-03 NOTE — Addendum Note (Signed)
Addended by: Chrystine Frogge, [place] W on: 05/03/2020 03:00 PM   Modules accepted: Orders

## 2020-05-09 ENCOUNTER — Ambulatory Visit: Payer: 59 | Admitting: Physical Therapy

## 2020-05-10 ENCOUNTER — Ambulatory Visit (INDEPENDENT_AMBULATORY_CARE_PROVIDER_SITE_OTHER): Payer: 59 | Admitting: Bariatrics

## 2020-05-11 ENCOUNTER — Other Ambulatory Visit (HOSPITAL_COMMUNITY): Payer: Self-pay

## 2020-05-12 DIAGNOSIS — Z1389 Encounter for screening for other disorder: Secondary | ICD-10-CM | POA: Diagnosis not present

## 2020-05-12 DIAGNOSIS — E118 Type 2 diabetes mellitus with unspecified complications: Secondary | ICD-10-CM | POA: Diagnosis not present

## 2020-05-12 DIAGNOSIS — Z6841 Body Mass Index (BMI) 40.0 and over, adult: Secondary | ICD-10-CM | POA: Diagnosis not present

## 2020-05-16 ENCOUNTER — Ambulatory Visit: Payer: 59 | Attending: Orthopaedic Surgery | Admitting: Physical Therapy

## 2020-05-16 ENCOUNTER — Other Ambulatory Visit: Payer: Self-pay

## 2020-05-16 ENCOUNTER — Encounter: Payer: Self-pay | Admitting: Physical Therapy

## 2020-05-16 DIAGNOSIS — M25611 Stiffness of right shoulder, not elsewhere classified: Secondary | ICD-10-CM | POA: Insufficient documentation

## 2020-05-16 DIAGNOSIS — M25511 Pain in right shoulder: Secondary | ICD-10-CM | POA: Diagnosis not present

## 2020-05-16 NOTE — Therapy (Signed)
[facility] [place], [place], [address] Phone: [phone]   Fax:  [phone]  Physical Therapy Treatment  Patient Details  Name: Crystal Roberts MRN: [id number] Date of Birth: 1955/03/11 Referring Provider (PT): Ophelia Charter MD   Encounter Date: 05/16/2020   PT End of Session - 05/16/20 1649    Visit Number 33    Number of Visits 40    Date for PT Re-Evaluation 06/02/20    Authorization Type FOTO.    PT Start Time 1641    PT Stop Time 1726    PT Time Calculation (min) 45 min    Activity Tolerance Patient tolerated treatment well    Behavior During Therapy WFL for tasks assessed/performed           Past Medical History:  Diagnosis Date  . Anxiety   . Arthritis    neck- cervical disc herniation   . Asthma    as child  . Back pain   . Cancer ([place])    skin - low back , insitu  melanoma   . Constipation   . COVID-19 09/07/2019   sx fever/cough/chills/lost taste and smell  . Depression   . Diabetes mellitus without complication ([place])   . Dyspnea   . Gallbladder problem   . GERD (gastroesophageal reflux disease)   . H/O cardiovascular stress test 1990   pt. reports that she was having a lot of stress & she was seen by cardiac- had stress test & told that it was negative   . Hyperlipidemia   . Hypertension   . Hypothyroidism   . Leg edema   . Pulmonary embolism ([place]) [place]. at [facility]- on blood thinning med., thought to be related to lack of activity, smoking & BCP    Past Surgical History:  Procedure Laterality Date  . ANTERIOR CERVICAL DECOMP/DISCECTOMY FUSION N/A 08/16/2016   Procedure: Cervical Four-Five Anterior cervical decompression/discectomy/fusion;  Surgeon: Erline Levine, MD;  Location: [place];  Service: Neurosurgery;  Laterality: N/A;  C4-5 Anterior cervical decompression/discectomy/fusion  . BACK SURGERY     sebaceous cyst removed  . CHOLECYSTECTOMY  2446   umbilical hernia ------ followed  cholecystectomy  . Cyst removed from back    . CYSTECTOMY Bilateral    cyst on ovaries  . HERNIA REPAIR     umbilical hernia repair   . MASS EXCISION N/A 11/21/2017   Procedure: EXCISION SEBACEOUS CYST ON BACK;  Surgeon: Aviva Signs, MD;  Location: [facility];  Service: General;  Laterality: N/A;  . SHOULDER ARTHROSCOPY WITH ROTATOR CUFF REPAIR AND SUBACROMIAL DECOMPRESSION Right 09/23/2019   Procedure: RIGHT SHOULDER ARTHROSCOPY DEBRIDEMENT, ACROMIOPLASTY, DISTAL CLAVICAL EXCISION ROTATOR CUFF REPAIR;  Surgeon: Hiram Gash, MD;  Location: [place];  Service: Orthopedics;  Laterality: Right;  . TUBAL LIGATION      There were no vitals filed for this visit.   Subjective Assessment - 05/16/20 1648    Subjective COVID 19 screening performed on patient upon arrival. Reports she has had low grade pain all weekend.    Pertinent History Hypothyroidism, DM, h/o back pain, hernia repair, SCDF, OA.    Patient Stated Goals Use right arm without difficulty.    Currently in Pain? Yes    Pain Score 2     Pain Location Shoulder    Pain Orientation Right    Pain Descriptors / Indicators Discomfort    Pain Type Surgical pain    Pain Onset In the past  7 days    Pain Frequency Intermittent              [facility] PT Assessment - 05/16/20 0001      Assessment   Medical Diagnosis RT shld DCR, SAD, massive RTC repair.    Referring Provider (PT) Ophelia Charter MD    Onset Date/Surgical Date 09/23/19    Next MD Visit 06/01/2020      Precautions   Precaution Comments Follow protocol.                         [facility] Adult PT Treatment/Exercise - 05/16/20 0001      Elbow Exercises   Elbow Flexion Strengthening;Right;20 reps;Standing;Bar weights/barbell   3D   Bar Weights/Barbell (Elbow Flexion) 4 lbs      Shoulder Exercises: Standing   Protraction Strengthening;Right;20 reps;10 reps;Theraband    Theraband Level (Shoulder Protraction) Level 2 (Red)    External Rotation  Strengthening;Right;20 reps;10 reps;Theraband    Theraband Level (Shoulder External Rotation) Level 2 (Red)    Internal Rotation Strengthening;Right;20 reps;10 reps;Theraband    Theraband Level (Shoulder Internal Rotation) Level 2 (Red)    Extension Strengthening;Both;20 reps;Limitations    Extension Limitations Blue XTS    Row Strengthening;Both;20 reps;Limitations    Row Limitations Blue XTS    Diagonals Strengthening;Right;20 reps;Limitations    Diagonals Limitations Blue XTS    Other Standing Exercises RUE ABCs x1 rep      Shoulder Exercises: ROM/Strengthening   UBE (Upper Arm Bike) 60 RPM x10 min      Modalities   Modalities Vasopneumatic      Vasopneumatic   Number Minutes Vasopneumatic  10 minutes    Vasopnuematic Location  Shoulder    Vasopneumatic Pressure Low    Vasopneumatic Temperature  34/discomfort                       PT Long Term Goals - 04/25/20 1656      PT LONG TERM GOAL #1   Title Independent with a HEP.    Time 8    Period Weeks    Status Achieved      PT LONG TERM GOAL #2   Title Active right shoulder flexion to 145 degrees so the patient can easily reach overhead.    Time 8    Period Weeks    Status Achieved      PT LONG TERM GOAL #3   Title Active ER to 70 degrees+ to allow for easily donning/doffing of apparel.    Time 8    Period Weeks    Status Achieved   60     PT LONG TERM GOAL #4   Title Increase ROM so patient is able to reach behind back to L4.    Time 8    Period Weeks    Status Not Met   R glute     PT LONG TERM GOAL #5   Title Increase shoulder strength to a solid 4 to 4+/5 to increase stability for performance of functional activities.    Time 8    Period Weeks    Status Partially Met      PT LONG TERM GOAL #6   Title Perform ADL's with pain not > 3/10.    Time 8    Period Weeks    Status Achieved   at most 4/10                Plan -  05/16/20 1748    Clinical Impression Statement Patient  presented in clinic with low level R shoulder discomfort which has lasted a few days and from unknown cause. Patient progressed with resistance as well as to more functional resisted strengthening. Patient reported muscle fatigue mostly but did report some discomfort with ER and bicep curl pronated. Normal vasopneumatic response noted following removal of the modality.    Personal Factors and Comorbidities Comorbidity 1;Comorbidity 2    Comorbidities Hypothyroidism, DM, h/o back pain, hernia repair, SCDF, OA.    Examination-Activity Limitations Reach Overhead;Other    Examination-Participation Restrictions Other    Stability/Clinical Decision Making Stable/Uncomplicated    Rehab Potential Excellent    PT Frequency 2x / week    PT Duration 8 weeks    PT Treatment/Interventions ADLs/Self Care Home Management;Cryotherapy;Electrical Stimulation;Ultrasound;Moist Heat;Therapeutic activities;Therapeutic exercise;Patient/family education;Passive range of motion;Vasopneumatic Device    PT Next Visit Plan Progress as symptoms dictate with light strengthening and AROM. FOTO at 32st visit.    Consulted and Agree with Plan of Care Patient           Patient will benefit from skilled therapeutic intervention in order to improve the following deficits and impairments:  Pain,Decreased activity tolerance,Increased edema,Decreased range of motion  Visit Diagnosis: Acute pain of right shoulder  Stiffness of right shoulder, not elsewhere classified     Problem List Patient Active Problem List   Diagnosis Date Noted  . Sebaceous cyst   . Class 2 severe obesity with serious comorbidity and body mass index (BMI) of 35.0 to 35.9 in adult ([place]) 10/27/2017  . Other fatigue 09/10/2017  . Shortness of breath on exertion 09/10/2017  . Type 2 diabetes mellitus without complication, without long-term current use of insulin ([place]) 09/10/2017  . Essential hypertension 09/10/2017  . Other hyperlipidemia 09/10/2017   . Other specified hypothyroidism 09/10/2017  . Herniated cervical disc 08/16/2016    Standley Brooking, PTA 05/16/2020, 5:50 PM  [facility] [address] [place], [place], [address] Phone: [phone]   Fax:  [phone]  Name: Crystal Roberts MRN: [id number] Date of Birth: 31-Jul-1955

## 2020-05-22 ENCOUNTER — Encounter (INDEPENDENT_AMBULATORY_CARE_PROVIDER_SITE_OTHER): Payer: Self-pay | Admitting: Bariatrics

## 2020-05-22 ENCOUNTER — Ambulatory Visit (INDEPENDENT_AMBULATORY_CARE_PROVIDER_SITE_OTHER): Payer: 59 | Admitting: Bariatrics

## 2020-05-22 ENCOUNTER — Other Ambulatory Visit: Payer: Self-pay

## 2020-05-22 VITALS — BP 181/95 | HR 60 | Temp 98.3°F | Ht 64.0 in | Wt 241.0 lb

## 2020-05-22 DIAGNOSIS — E7849 Other hyperlipidemia: Secondary | ICD-10-CM | POA: Diagnosis not present

## 2020-05-22 DIAGNOSIS — Z6841 Body Mass Index (BMI) 40.0 and over, adult: Secondary | ICD-10-CM

## 2020-05-22 DIAGNOSIS — I10 Essential (primary) hypertension: Secondary | ICD-10-CM | POA: Diagnosis not present

## 2020-05-22 DIAGNOSIS — E1169 Type 2 diabetes mellitus with other specified complication: Secondary | ICD-10-CM

## 2020-05-23 ENCOUNTER — Ambulatory Visit: Payer: 59 | Admitting: Physical Therapy

## 2020-05-23 ENCOUNTER — Encounter: Payer: Self-pay | Admitting: Physical Therapy

## 2020-05-23 ENCOUNTER — Other Ambulatory Visit: Payer: Self-pay

## 2020-05-23 DIAGNOSIS — M25511 Pain in right shoulder: Secondary | ICD-10-CM

## 2020-05-23 DIAGNOSIS — M25611 Stiffness of right shoulder, not elsewhere classified: Secondary | ICD-10-CM | POA: Diagnosis not present

## 2020-05-23 NOTE — Progress Notes (Signed)
Chief Complaint:   OBESITY Crystal Roberts is here to discuss her progress with her obesity treatment plan along with follow-up of her obesity related diagnoses. Crystal Roberts is on the Category 2 Plan and states she is following her eating plan approximately 25% of the time. Crystal Roberts states she is walking 30 minutes 3 times per week.  Today's visit was #: 19 Starting weight: 241 lbs Starting date: 09/10/2017 Today's weight: 241 lbs Today's date: 05/22/2020 Total lbs lost to date: 0 Total lbs lost since last in-office visit: 0  Interim History: Crystal Roberts's weight remains the dame. Her last visit was 01/05/2020. She has not been following her plan closely.  Subjective:   1. Essential hypertension Crystal Roberts is taking Hyzaar. Her BP is not well controlled. She is unsure if she took her medication last night. She has been uptight.  BP Readings from Last 3 Encounters:  05/22/20 (!) 181/95  01/05/20 (!) 168/89  10/28/19 (!) 151/80   2. Other hyperlipidemia Crystal Roberts is taking Zocor.  Lab Results  Component Value Date   ALT 22 01/05/2020   AST 24 01/05/2020   ALKPHOS 95 01/05/2020   BILITOT 0.2 01/05/2020   Lab Results  Component Value Date   CHOL 196 01/05/2020   HDL 67 01/05/2020   LDLCALC 109 (H) 01/05/2020   TRIG 113 01/05/2020    Assessment/Plan:   1. Essential hypertension Crystal Roberts is working on healthy weight loss and exercise to improve blood pressure control. We will watch for signs of hypotension as she continues her lifestyle modifications. Continue medication. Will check BP at home, and if remains high (above 140/90), pt will call PCP.  2. Other hyperlipidemia Cardiovascular risk and specific lipid/LDL goals reviewed.  We discussed several lifestyle modifications today and Crystal Roberts will continue to work on diet, exercise and weight loss efforts. Orders and follow up as documented in patient record. Continue current treatment plan.  Counseling Intensive lifestyle modifications are the first  line treatment for this issue. . Dietary changes: Increase soluble fiber. Decrease simple carbohydrates. . Exercise changes: Moderate to vigorous-intensity aerobic activity 150 minutes per week if tolerated. . Lipid-lowering medications: see documented in medical record.  3. Obesity, current BMI 41  Crystal Roberts is currently in the action stage of change. As such, her goal is to continue with weight loss efforts. She has agreed to the Category 2 Plan.   Meal plan Will be more adherent to the plan Increase water intake  Exercise goals: Crystal Roberts is going to therapy for her shoulder and will continue to walk.  Behavioral modification strategies: increasing lean protein intake, decreasing simple carbohydrates, increasing vegetables, increasing water intake, decreasing eating out, no skipping meals, meal planning and cooking strategies, keeping healthy foods in the home and planning for success.  Crystal Roberts has agreed to follow-up with our clinic in 8 weeks. She was informed of the importance of frequent follow-up visits to maximize her success with intensive lifestyle modifications for her multiple health conditions.   Objective:   Blood pressure (!) 181/95, pulse 60, temperature 98.3 F (36.8 C), height 5\' 4"  (1.626 m), weight 241 lb (109.3 kg), SpO2 96 %. Body mass index is 41.37 kg/m.  General: Cooperative, alert, well developed, in no acute distress. HEENT: Conjunctivae and lids unremarkable. Cardiovascular: Regular rhythm.  Lungs: Normal work of breathing. Neurologic: No focal deficits.   Lab Results  Component Value Date   CREATININE 0.83 01/05/2020   BUN 12 01/05/2020   NA 141 01/05/2020   K 4.6 01/05/2020  CL 102 01/05/2020   CO2 24 01/05/2020   Lab Results  Component Value Date   ALT 22 01/05/2020   AST 24 01/05/2020   ALKPHOS 95 01/05/2020   BILITOT 0.2 01/05/2020   Lab Results  Component Value Date   HGBA1C 6.0 (H) 11/18/2017   HGBA1C 6.1 (H) 09/10/2017   HGBA1C 5.8 (H)  08/13/2016   Lab Results  Component Value Date   INSULIN 16.8 01/05/2020   INSULIN 27.5 (H) 09/10/2017   Lab Results  Component Value Date   TSH 1.080 01/05/2020   Lab Results  Component Value Date   CHOL 196 01/05/2020   HDL 67 01/05/2020   LDLCALC 109 (H) 01/05/2020   TRIG 113 01/05/2020   Lab Results  Component Value Date   WBC 8.6 11/18/2017   HGB 13.9 11/18/2017   HCT 43.8 11/18/2017   MCV 93.8 11/18/2017   PLT 361 11/18/2017    Attestation Statements:   Reviewed by clinician on day of visit: allergies, medications, problem list, medical history, surgical history, family history, social history, and previous encounter notes.  Time spent on visit including pre-visit chart review and post-visit care and charting was 20 minutes.   Coral Ceo, CMA, am acting as Location manager for [employer], DO.  I have reviewed the above documentation for accuracy and completeness, and I agree with the above. Jearld Lesch, DO

## 2020-05-23 NOTE — Therapy (Signed)
[facility] [place], [place], [address] Phone: [phone]   Fax:  [phone]  Physical Therapy Treatment  Patient Details  Name: Crystal Roberts MRN: [id number] Date of Birth: [birth year] Referring Provider (PT): Ophelia Charter MD   Encounter Date: 05/23/2020   PT End of Session - 05/23/20 1647    Visit Number 34    Number of Visits 40    Date for PT Re-Evaluation 06/02/20    Authorization Type FOTO.    PT Start Time 1643    PT Stop Time 1733    PT Time Calculation (min) 50 min    Activity Tolerance Patient tolerated treatment well    Behavior During Therapy WFL for tasks assessed/performed           Past Medical History:  Diagnosis Date  . Anxiety   . Arthritis    neck- cervical disc herniation   . Asthma    as child  . Back pain   . Cancer ([place])    skin - low back , insitu  melanoma   . Constipation   . COVID-19 09/07/2019   sx fever/cough/chills/lost taste and smell  . Depression   . Diabetes mellitus without complication ([place])   . Dyspnea   . Gallbladder problem   . GERD (gastroesophageal reflux disease)   . H/O cardiovascular stress test 1990   pt. reports that she was having a lot of stress & she was seen by cardiac- had stress test & told that it was negative   . Hyperlipidemia   . Hypertension   . Hypothyroidism   . Leg edema   . Pulmonary embolism ([place]) [place]. at [facility]- on blood thinning med., thought to be related to lack of activity, smoking & BCP    Past Surgical History:  Procedure Laterality Date  . ANTERIOR CERVICAL DECOMP/DISCECTOMY FUSION N/A 08/16/2016   Procedure: Cervical Four-Five Anterior cervical decompression/discectomy/fusion;  Surgeon: Erline Levine, MD;  Location: [place];  Service: Neurosurgery;  Laterality: N/A;  C4-5 Anterior cervical decompression/discectomy/fusion  . BACK SURGERY     sebaceous cyst removed  . CHOLECYSTECTOMY  5400   umbilical hernia ------ followed  cholecystectomy  . Cyst removed from back    . CYSTECTOMY Bilateral    cyst on ovaries  . HERNIA REPAIR     umbilical hernia repair   . MASS EXCISION N/A 11/21/2017   Procedure: EXCISION SEBACEOUS CYST ON BACK;  Surgeon: Aviva Signs, MD;  Location: [facility];  Service: General;  Laterality: N/A;  . SHOULDER ARTHROSCOPY WITH ROTATOR CUFF REPAIR AND SUBACROMIAL DECOMPRESSION Right 09/23/2019   Procedure: RIGHT SHOULDER ARTHROSCOPY DEBRIDEMENT, ACROMIOPLASTY, DISTAL CLAVICAL EXCISION ROTATOR CUFF REPAIR;  Surgeon: Hiram Gash, MD;  Location: [place];  Service: Orthopedics;  Laterality: Right;  . TUBAL LIGATION      There were no vitals filed for this visit.   Subjective Assessment - 05/23/20 1647    Subjective COVID 19 screening performed on patient upon arrival. Reports that she still cannot reach behind her back well but thinks a towel may be too bulky.    Pertinent History Hypothyroidism, DM, h/o back pain, hernia repair, SCDF, OA.    Patient Stated Goals Use right arm without difficulty.    Currently in Pain? Other (Comment)   No pain assessment provided             [facility] PT Assessment - 05/23/20 0001      Assessment  Medical Diagnosis RT shld DCR, SAD, massive RTC repair.    Referring Provider (PT) Ophelia Charter MD    Onset Date/Surgical Date 09/23/19    Hand Dominance Right    Next MD Visit 06/01/2020      Precautions   Precaution Comments Follow protocol.      Observation/Other Assessments   Focus on Therapeutic Outcomes (FOTO)  28% limitation 05/23/2020 at 34th visit      ROM / Strength   AROM / PROM / Strength AROM;Strength      AROM   Overall AROM  Within functional limits for tasks performed    AROM Assessment Site Shoulder    Right/Left Shoulder Right    Right Shoulder Flexion 149 Degrees      Strength   Strength Assessment Site Shoulder    Right/Left Shoulder Right    Right Shoulder Flexion 4/5    Right Shoulder ABduction 4/5    Right  Shoulder Internal Rotation 4+/5    Right Shoulder External Rotation 4+/5                         [facility] Adult PT Treatment/Exercise - 05/23/20 0001      Shoulder Exercises: Seated   Flexion Strengthening;Right;Weights;20 reps;10 reps    Flexion Weight (lbs) 2    Flexion Limitations long and short lever arm    Abduction Strengthening;Right;20 reps;10 reps;Weights    ABduction Weight (lbs) 2      Shoulder Exercises: Standing   Protraction Strengthening;Right;20 reps;10 reps;Theraband    Theraband Level (Shoulder Protraction) Level 2 (Red)    External Rotation Strengthening;Right;20 reps;10 reps;Theraband    Theraband Level (Shoulder External Rotation) Level 2 (Red)    Internal Rotation Strengthening;Right;20 reps;10 reps;Theraband    Theraband Level (Shoulder Internal Rotation) Level 2 (Red)    Extension Strengthening;Both;20 reps;Limitations    Theraband Level (Shoulder Extension) Level 2 (Red)      Shoulder Exercises: ROM/Strengthening   UBE (Upper Arm Bike) 60 RPM x8 min      Shoulder Exercises: Stretch   Internal Rotation Stretch 3 reps    Internal Rotation Stretch Limitations 20 sec holds; SL sleeper stretch      Modalities   Modalities Vasopneumatic      Vasopneumatic   Number Minutes Vasopneumatic  10 minutes    Vasopnuematic Location  Shoulder    Vasopneumatic Pressure Low    Vasopneumatic Temperature  34/discomfort                  PT Education - 05/23/20 1723    Education Details HEP SL sleeper stretch    Person(s) Educated Patient    Methods Explanation;Demonstration;Handout    Comprehension Verbalized understanding               PT Long Term Goals - 05/23/20 1654      PT LONG TERM GOAL #1   Title Independent with a HEP.    Time 8    Period Weeks    Status Achieved      PT LONG TERM GOAL #2   Title Active right shoulder flexion to 145 degrees so the patient can easily reach overhead.    Time 8    Period Weeks    Status  Achieved      PT LONG TERM GOAL #3   Title Active ER to 70 degrees+ to allow for easily donning/doffing of apparel.    Time 8    Period Weeks  Status Achieved   60     PT LONG TERM GOAL #4   Title Increase ROM so patient is able to reach behind back to L4.    Time 8    Period Weeks    Status Not Met   R glute     PT LONG TERM GOAL #5   Title Increase shoulder strength to a solid 4 to 4+/5 to increase stability for performance of functional activities.    Time 8    Period Weeks    Status Partially Met      PT LONG TERM GOAL #6   Title Perform ADL's with pain not > 3/10.    Time 8    Period Weeks    Status Achieved   at most 4/10                Plan - 05/23/20 1739    Clinical Impression Statement Patient presented in clinic with no complaints of R shoulder discomfort. Patient reports continued compliance with HEP but still lacking full behind the back IR. Goal for behind the back IR is L4 region and patient is only able to obtain to R glute. Patient also slightly deficient with antigravity flexion and abduction. Patient instructed to use a flat sheet for towel IR stretch and also instructed with SL sleeper stretch to improve IR. Normal vasopneumatic response noted following removal of the modality.    Personal Factors and Comorbidities Comorbidity 1;Comorbidity 2    Comorbidities Hypothyroidism, DM, h/o back pain, hernia repair, SCDF, OA.    Examination-Activity Limitations Reach Overhead;Other    Examination-Participation Restrictions Other    Stability/Clinical Decision Making Stable/Uncomplicated    Rehab Potential Excellent    PT Frequency 2x / week    PT Duration 8 weeks    PT Treatment/Interventions ADLs/Self Care Home Management;Cryotherapy;Electrical Stimulation;Ultrasound;Moist Heat;Therapeutic activities;Therapeutic exercise;Patient/family education;Passive range of motion;Vasopneumatic Device    PT Next Visit Plan Progress as symptoms dictate with light  strengthening and AROM.    Consulted and Agree with Plan of Care Patient           Patient will benefit from skilled therapeutic intervention in order to improve the following deficits and impairments:  Pain,Decreased activity tolerance,Increased edema,Decreased range of motion  Visit Diagnosis: Acute pain of right shoulder  Stiffness of right shoulder, not elsewhere classified     Problem List Patient Active Problem List   Diagnosis Date Noted  . Sebaceous cyst   . Class 2 severe obesity with serious comorbidity and body mass index (BMI) of 35.0 to 35.9 in adult ([place]) 10/27/2017  . Other fatigue 09/10/2017  . Shortness of breath on exertion 09/10/2017  . Type 2 diabetes mellitus without complication, without long-term current use of insulin ([place]) 09/10/2017  . Essential hypertension 09/10/2017  . Other hyperlipidemia 09/10/2017  . Other specified hypothyroidism 09/10/2017  . Herniated cervical disc 08/16/2016    Standley Brooking, PTA 05/23/2020, 5:46 PM  [facility] [place], [place], [address] Phone: [phone]   Fax:  [phone]  Name: Crystal Roberts MRN: [id number] Date of Birth: [birth year]

## 2020-05-24 ENCOUNTER — Encounter (INDEPENDENT_AMBULATORY_CARE_PROVIDER_SITE_OTHER): Payer: Self-pay | Admitting: Bariatrics

## 2020-05-25 ENCOUNTER — Other Ambulatory Visit (HOSPITAL_COMMUNITY): Payer: Self-pay

## 2020-05-26 ENCOUNTER — Other Ambulatory Visit (HOSPITAL_COMMUNITY): Payer: Self-pay

## 2020-05-26 MED FILL — Bupropion HCl Tab ER 24HR 300 MG: ORAL | 90 days supply | Qty: 90 | Fill #0 | Status: AC

## 2020-05-26 MED FILL — Levothyroxine Sodium Tab 75 MCG: ORAL | 90 days supply | Qty: 90 | Fill #0 | Status: AC

## 2020-05-26 MED FILL — Losartan Potassium & Hydrochlorothiazide Tab 100-25 MG: ORAL | 90 days supply | Qty: 90 | Fill #0 | Status: AC

## 2020-05-26 MED FILL — Meloxicam Tab 15 MG: ORAL | 90 days supply | Qty: 90 | Fill #0 | Status: AC

## 2020-05-26 MED FILL — Simvastatin Tab 40 MG: ORAL | 90 days supply | Qty: 90 | Fill #0 | Status: AC

## 2020-05-30 ENCOUNTER — Ambulatory Visit: Payer: 59 | Admitting: Physical Therapy

## 2020-05-30 ENCOUNTER — Other Ambulatory Visit: Payer: Self-pay

## 2020-05-30 ENCOUNTER — Encounter: Payer: Self-pay | Admitting: Physical Therapy

## 2020-05-30 DIAGNOSIS — M25511 Pain in right shoulder: Secondary | ICD-10-CM | POA: Diagnosis not present

## 2020-05-30 DIAGNOSIS — M25611 Stiffness of right shoulder, not elsewhere classified: Secondary | ICD-10-CM

## 2020-05-30 NOTE — Therapy (Signed)
[facility] [place], [place], [address] Phone: [phone]   Fax:  [phone]  Physical Therapy Treatment  Patient Details  Name: Crystal Roberts MRN: [id number] Date of Birth: July 06, 1955 Referring Provider (PT): Ophelia Charter MD   Encounter Date: 05/30/2020   PT End of Session - 05/30/20 1652    Visit Number 35    Number of Visits 40    Date for PT Re-Evaluation 06/02/20    Authorization Type FOTO.    PT Start Time 1645    PT Stop Time 1726    PT Time Calculation (min) 41 min    Activity Tolerance Patient tolerated treatment well    Behavior During Therapy WFL for tasks assessed/performed           Past Medical History:  Diagnosis Date  . Anxiety   . Arthritis    neck- cervical disc herniation   . Asthma    as child  . Back pain   . Cancer ([place])    skin - low back , insitu  melanoma   . Constipation   . COVID-19 09/07/2019   sx fever/cough/chills/lost taste and smell  . Depression   . Diabetes mellitus without complication ([place])   . Dyspnea   . Gallbladder problem   . GERD (gastroesophageal reflux disease)   . H/O cardiovascular stress test 1990   pt. reports that she was having a lot of stress & she was seen by cardiac- had stress test & told that it was negative   . Hyperlipidemia   . Hypertension   . Hypothyroidism   . Leg edema   . Pulmonary embolism ([place]) [place]. at [facility]- on blood thinning med., thought to be related to lack of activity, smoking & BCP    Past Surgical History:  Procedure Laterality Date  . ANTERIOR CERVICAL DECOMP/DISCECTOMY FUSION N/A 08/16/2016   Procedure: Cervical Four-Five Anterior cervical decompression/discectomy/fusion;  Surgeon: Erline Levine, MD;  Location: [place];  Service: Neurosurgery;  Laterality: N/A;  C4-5 Anterior cervical decompression/discectomy/fusion  . BACK SURGERY     sebaceous cyst removed  . CHOLECYSTECTOMY  9937   umbilical hernia ------ followed  cholecystectomy  . Cyst removed from back    . CYSTECTOMY Bilateral    cyst on ovaries  . HERNIA REPAIR     umbilical hernia repair   . MASS EXCISION N/A 11/21/2017   Procedure: EXCISION SEBACEOUS CYST ON BACK;  Surgeon: Aviva Signs, MD;  Location: [facility];  Service: General;  Laterality: N/A;  . SHOULDER ARTHROSCOPY WITH ROTATOR CUFF REPAIR AND SUBACROMIAL DECOMPRESSION Right 09/23/2019   Procedure: RIGHT SHOULDER ARTHROSCOPY DEBRIDEMENT, ACROMIOPLASTY, DISTAL CLAVICAL EXCISION ROTATOR CUFF REPAIR;  Surgeon: Hiram Gash, MD;  Location: [place];  Service: Orthopedics;  Laterality: Right;  . TUBAL LIGATION      There were no vitals filed for this visit.   Subjective Assessment - 05/30/20 1814    Subjective COVID 19 screening performed on patient upon arrival. Reports that she still cannot reach behind her back well and did a lot of yardwork especially with push lawnmower and weedeater.    Pertinent History Hypothyroidism, DM, h/o back pain, hernia repair, SCDF, OA.    Patient Stated Goals Use right arm without difficulty.    Currently in Pain? Other (Comment)   No pain assessment provided             [facility] PT Assessment - 05/30/20 0001  Assessment   Medical Diagnosis RT shld DCR, SAD, massive RTC repair.    Referring Provider (PT) Ophelia Charter MD    Onset Date/Surgical Date 09/23/19    Hand Dominance Right    Next MD Visit 06/01/2020      Precautions   Precaution Comments Follow protocol.      ROM / Strength   AROM / PROM / Strength AROM      AROM   Overall AROM  Within functional limits for tasks performed    AROM Assessment Site Shoulder    Right/Left Shoulder Right    Right Shoulder Flexion 145 Degrees    Right Shoulder External Rotation 72 Degrees                         [facility] Adult PT Treatment/Exercise - 05/30/20 0001      Shoulder Exercises: Standing   External Rotation Strengthening;Right;20 reps;10 reps;Theraband     Theraband Level (Shoulder External Rotation) Level 2 (Red)    Internal Rotation Strengthening;Right;20 reps;10 reps;Theraband    Theraband Level (Shoulder Internal Rotation) Level 2 (Red)    Flexion Strengthening;Right;20 reps;Weights    Shoulder Flexion Weight (lbs) 2    ABduction Strengthening;Right;20 reps;Weights    Shoulder ABduction Weight (lbs) 2    Extension Strengthening;Both;20 reps;Limitations    Theraband Level (Shoulder Extension) Level 2 (Red)    Row Strengthening;Both;20 reps;Limitations    Theraband Level (Shoulder Row) Level 2 (Red)    Other Standing Exercises R shoulder scaption 2# x20 reps    Other Standing Exercises shoulder flexion slide with hold at end range x15 reps      Shoulder Exercises: ROM/Strengthening   UBE (Upper Arm Bike) 60 RPM x8 min      Modalities   Modalities Vasopneumatic      Vasopneumatic   Number Minutes Vasopneumatic  10 minutes    Vasopnuematic Location  Shoulder    Vasopneumatic Pressure Low    Vasopneumatic Temperature  34/discomfort                       PT Long Term Goals - 05/23/20 1654      PT LONG TERM GOAL #1   Title Independent with a HEP.    Time 8    Period Weeks    Status Achieved      PT LONG TERM GOAL #2   Title Active right shoulder flexion to 145 degrees so the patient can easily reach overhead.    Time 8    Period Weeks    Status Achieved      PT LONG TERM GOAL #3   Title Active ER to 70 degrees+ to allow for easily donning/doffing of apparel.    Time 8    Period Weeks    Status Achieved   60     PT LONG TERM GOAL #4   Title Increase ROM so patient is able to reach behind back to L4.    Time 8    Period Weeks    Status Not Met   R glute     PT LONG TERM GOAL #5   Title Increase shoulder strength to a solid 4 to 4+/5 to increase stability for performance of functional activities.    Time 8    Period Weeks    Status Partially Met      PT LONG TERM GOAL #6   Title Perform ADL's with  pain not > 3/10.  Time 8    Period Weeks    Status Achieved   at most 4/10                Plan - 05/30/20 1812    Clinical Impression Statement Patient presented in clinic with minimal shoulder pain after a lot of yardwork this weekend. Patient had her grandson for help but still had to carry weedeater. Reported soreness after yardwork but limited HEP due to soreness. Patient able to progress with antigravity strengthening but limited with IR behind the back. Patient only able to obtain R glute but has HEP stretches. Normal vasopneumatic response noted following removal of the modality.    Personal Factors and Comorbidities Comorbidity 1;Comorbidity 2    Comorbidities Hypothyroidism, DM, h/o back pain, hernia repair, SCDF, OA.    Examination-Activity Limitations Reach Overhead;Other    Examination-Participation Restrictions Other    Stability/Clinical Decision Making Stable/Uncomplicated    Rehab Potential Excellent    PT Frequency 2x / week    PT Duration 8 weeks    PT Treatment/Interventions ADLs/Self Care Home Management;Cryotherapy;Electrical Stimulation;Ultrasound;Moist Heat;Therapeutic activities;Therapeutic exercise;Patient/family education;Passive range of motion;Vasopneumatic Device    PT Next Visit Plan Progress as symptoms dictate with light strengthening and AROM.    Consulted and Agree with Plan of Care Patient           Patient will benefit from skilled therapeutic intervention in order to improve the following deficits and impairments:  Pain,Decreased activity tolerance,Increased edema,Decreased range of motion  Visit Diagnosis: Acute pain of right shoulder  Stiffness of right shoulder, not elsewhere classified     Problem List Patient Active Problem List   Diagnosis Date Noted  . Sebaceous cyst   . Class 2 severe obesity with serious comorbidity and body mass index (BMI) of 35.0 to 35.9 in adult ([place]) 10/27/2017  . Other fatigue 09/10/2017  .  Shortness of breath on exertion 09/10/2017  . Type 2 diabetes mellitus without complication, without long-term current use of insulin ([place]) 09/10/2017  . Essential hypertension 09/10/2017  . Other hyperlipidemia 09/10/2017  . Other specified hypothyroidism 09/10/2017  . Herniated cervical disc 08/16/2016    Standley Brooking, PTA 05/30/20 6:19 PM   [facility] [place], [place], [address] Phone: [phone]   Fax:  [phone]  Name: Crystal Roberts MRN: [id number] Date of Birth: [birth year]

## 2020-06-01 DIAGNOSIS — G5601 Carpal tunnel syndrome, right upper limb: Secondary | ICD-10-CM | POA: Diagnosis not present

## 2020-06-06 ENCOUNTER — Ambulatory Visit: Payer: 59 | Admitting: *Deleted

## 2020-06-06 ENCOUNTER — Other Ambulatory Visit: Payer: Self-pay

## 2020-06-06 DIAGNOSIS — M25511 Pain in right shoulder: Secondary | ICD-10-CM | POA: Diagnosis not present

## 2020-06-06 DIAGNOSIS — M25611 Stiffness of right shoulder, not elsewhere classified: Secondary | ICD-10-CM | POA: Diagnosis not present

## 2020-06-06 NOTE — Therapy (Signed)
[facility] [place], [place], [address] Phone: [phone]   Fax:  [phone]  Physical Therapy Treatment  Patient Details  Name: Crystal Roberts MRN: [id number] Date of Birth: 08-21-1955 Referring Provider (PT): Ophelia Charter MD   Encounter Date: 06/06/2020   PT End of Session - 06/06/20 1727    Visit Number 36    Number of Visits 40    Date for PT Re-Evaluation 06/02/20    Authorization Type FOTO.    PT Start Time 1645    PT Stop Time 1729    PT Time Calculation (min) 44 min           Past Medical History:  Diagnosis Date  . Anxiety   . Arthritis    neck- cervical disc herniation   . Asthma    as child  . Back pain   . Cancer ([place])    skin - low back , insitu  melanoma   . Constipation   . COVID-19 09/07/2019   sx fever/cough/chills/lost taste and smell  . Depression   . Diabetes mellitus without complication ([place])   . Dyspnea   . Gallbladder problem   . GERD (gastroesophageal reflux disease)   . H/O cardiovascular stress test 1990   pt. reports that she was having a lot of stress & she was seen by cardiac- had stress test & told that it was negative   . Hyperlipidemia   . Hypertension   . Hypothyroidism   . Leg edema   . Pulmonary embolism ([place]) [place]. at [facility]- on blood thinning med., thought to be related to lack of activity, smoking & BCP    Past Surgical History:  Procedure Laterality Date  . ANTERIOR CERVICAL DECOMP/DISCECTOMY FUSION N/A 08/16/2016   Procedure: Cervical Four-Five Anterior cervical decompression/discectomy/fusion;  Surgeon: Erline Levine, MD;  Location: [place];  Service: Neurosurgery;  Laterality: N/A;  C4-5 Anterior cervical decompression/discectomy/fusion  . BACK SURGERY     sebaceous cyst removed  . CHOLECYSTECTOMY  5056   umbilical hernia ------ followed cholecystectomy  . Cyst removed from back    . CYSTECTOMY Bilateral    cyst on ovaries  . HERNIA REPAIR      umbilical hernia repair   . MASS EXCISION N/A 11/21/2017   Procedure: EXCISION SEBACEOUS CYST ON BACK;  Surgeon: Aviva Signs, MD;  Location: [facility];  Service: General;  Laterality: N/A;  . SHOULDER ARTHROSCOPY WITH ROTATOR CUFF REPAIR AND SUBACROMIAL DECOMPRESSION Right 09/23/2019   Procedure: RIGHT SHOULDER ARTHROSCOPY DEBRIDEMENT, ACROMIOPLASTY, DISTAL CLAVICAL EXCISION ROTATOR CUFF REPAIR;  Surgeon: Hiram Gash, MD;  Location: [place];  Service: Orthopedics;  Laterality: Right;  . TUBAL LIGATION      There were no vitals filed for this visit.   Subjective Assessment - 06/06/20 1648    Subjective COVID 19 screening performed on patient upon arrival. Minimal pain today    Pertinent History Hypothyroidism, DM, h/o back pain, hernia repair, SCDF, OA.    Patient Stated Goals Use right arm without difficulty.    Currently in Pain? Yes    Pain Score 2     Pain Location Shoulder    Pain Orientation Right    Pain Descriptors / Indicators Discomfort    Pain Type Surgical pain    Pain Onset In the past 7 days  [facility] Adult PT Treatment/Exercise - 06/06/20 0001      Elbow Exercises   Elbow Flexion Strengthening;20 reps;10 reps    Bar Weights/Barbell (Elbow Flexion) 3 lbs      Shoulder Exercises: Standing   External Rotation Strengthening;Right;20 reps;10 reps;Theraband    Theraband Level (Shoulder External Rotation) Level 2 (Red)    Internal Rotation Strengthening;Right;20 reps;10 reps;Theraband    Theraband Level (Shoulder Internal Rotation) Level 2 (Red)    Flexion Strengthening;Right;20 reps;Weights    Shoulder Flexion Weight (lbs) 2    ABduction Strengthening;Right;20 reps;Weights    Extension Strengthening;Both;20 reps;Limitations    Theraband Level (Shoulder Extension) Level 2 (Red)    Row Strengthening;Both;20 reps;Limitations    Theraband Level (Shoulder Row) Level 2 (Red)    Other Standing Exercises wall wash  circles x 20 each way    Other Standing Exercises wall pushups x 20      Shoulder Exercises: ROM/Strengthening   UBE (Upper Arm Bike) 60 RPM x8 min      Modalities   Modalities Vasopneumatic      Vasopneumatic   Number Minutes Vasopneumatic  10 minutes    Vasopnuematic Location  Shoulder    Vasopneumatic Pressure Low    Vasopneumatic Temperature  34/discomfort      Manual Therapy   Manual Therapy Passive ROM    Passive ROM PROM of R shoulder into IR with end-range holds                       PT Long Term Goals - 05/23/20 1654      PT LONG TERM GOAL #1   Title Independent with a HEP.    Time 8    Period Weeks    Status Achieved      PT LONG TERM GOAL #2   Title Active right shoulder flexion to 145 degrees so the patient can easily reach overhead.    Time 8    Period Weeks    Status Achieved      PT LONG TERM GOAL #3   Title Active ER to 70 degrees+ to allow for easily donning/doffing of apparel.    Time 8    Period Weeks    Status Achieved   60     PT LONG TERM GOAL #4   Title Increase ROM so patient is able to reach behind back to L4.    Time 8    Period Weeks    Status Not Met   R glute     PT LONG TERM GOAL #5   Title Increase shoulder strength to a solid 4 to 4+/5 to increase stability for performance of functional activities.    Time 8    Period Weeks    Status Partially Met      PT LONG TERM GOAL #6   Title Perform ADL's with pain not > 3/10.    Time 8    Period Weeks    Status Achieved   at most 4/10                Plan - 06/06/20 1649    Clinical Impression Statement Pt arrived today doing fairly well and was able to resume PREs. Pt did well with Rx , but needs cueing for technique on some exs. PROM performed  for IR progression. Vaso end of Rx with normal response    Personal Factors and Comorbidities Comorbidity 1;Comorbidity 2    Comorbidities Hypothyroidism, DM, h/o back pain, hernia repair,  SCDF, OA.    Rehab Potential  Excellent    PT Frequency 2x / week    PT Duration 8 weeks    PT Treatment/Interventions ADLs/Self Care Home Management;Cryotherapy;Electrical Stimulation;Ultrasound;Moist Heat;Therapeutic activities;Therapeutic exercise;Patient/family education;Passive range of motion;Vasopneumatic Device    PT Next Visit Plan Progress as symptoms dictate with light strengthening and AROM.           Patient will benefit from skilled therapeutic intervention in order to improve the following deficits and impairments:  Pain,Decreased activity tolerance,Increased edema,Decreased range of motion  Visit Diagnosis: Acute pain of right shoulder  Stiffness of right shoulder, not elsewhere classified     Problem List Patient Active Problem List   Diagnosis Date Noted  . Sebaceous cyst   . Class 2 severe obesity with serious comorbidity and body mass index (BMI) of 35.0 to 35.9 in adult ([place]) 10/27/2017  . Other fatigue 09/10/2017  . Shortness of breath on exertion 09/10/2017  . Type 2 diabetes mellitus without complication, without long-term current use of insulin ([place]) 09/10/2017  . Essential hypertension 09/10/2017  . Other hyperlipidemia 09/10/2017  . Other specified hypothyroidism 09/10/2017  . Herniated cervical disc 08/16/2016    Raeley Gilmore,CHRIS,, PTA 06/06/2020, 5:32 PM  [facility] [address] [place], [place], [address] Phone: [phone]   Fax:  [phone]  Name: CASONDRA GASCA MRN: [id number] Date of Birth: [birth year]

## 2020-06-13 ENCOUNTER — Encounter: Payer: Self-pay | Admitting: Physical Therapy

## 2020-06-13 ENCOUNTER — Ambulatory Visit: Payer: 59 | Admitting: Physical Therapy

## 2020-06-13 ENCOUNTER — Other Ambulatory Visit: Payer: Self-pay

## 2020-06-13 DIAGNOSIS — M25511 Pain in right shoulder: Secondary | ICD-10-CM

## 2020-06-13 DIAGNOSIS — M25611 Stiffness of right shoulder, not elsewhere classified: Secondary | ICD-10-CM

## 2020-06-13 NOTE — Therapy (Addendum)
[facility] [place], [place], [address] Phone: [phone]   Fax:  [phone]  Physical Therapy Treatment  Patient Details  Name: Crystal Roberts MRN: [id number] Date of Birth: Feb 05, 1955 Referring Provider (PT): Ophelia Charter MD   Encounter Date: 06/13/2020   PT End of Session - 06/13/20 1642     Visit Number 37    Number of Visits 40    Date for PT Re-Evaluation 06/02/20    Authorization Type FOTO.    PT Start Time 1640    PT Stop Time 1725    PT Time Calculation (min) 45 min    Activity Tolerance Patient tolerated treatment well    Behavior During Therapy WFL for tasks assessed/performed             Past Medical History:  Diagnosis Date   Anxiety    Arthritis    neck- cervical disc herniation    Asthma    as child   Back pain    Cancer ([place])    skin - low back , insitu  melanoma    Constipation    COVID-19 09/07/2019   sx fever/cough/chills/lost taste and smell   Depression    Diabetes mellitus without complication ([place])    Dyspnea    Gallbladder problem    GERD (gastroesophageal reflux disease)    H/O cardiovascular stress test 1990   pt. reports that she was having a lot of stress & she was seen by cardiac- had stress test & told that it was negative    Hyperlipidemia    Hypertension    Hypothyroidism    Leg edema    Pulmonary embolism ([place]) [place]. at [facility]- on blood thinning med., thought to be related to lack of activity, smoking & BCP    Past Surgical History:  Procedure Laterality Date   ANTERIOR CERVICAL DECOMP/DISCECTOMY FUSION N/A 08/16/2016   Procedure: Cervical Four-Five Anterior cervical decompression/discectomy/fusion;  Surgeon: Erline Levine, MD;  Location: [facility];  Service: Neurosurgery;  Laterality: N/A;  C4-5 Anterior cervical decompression/discectomy/fusion   BACK SURGERY     sebaceous cyst removed   CHOLECYSTECTOMY  6962   umbilical hernia ------ followed cholecystectomy    Cyst removed from back     CYSTECTOMY Bilateral    cyst on ovaries   HERNIA REPAIR     umbilical hernia repair    MASS EXCISION N/A 11/21/2017   Procedure: EXCISION SEBACEOUS CYST ON BACK;  Surgeon: Aviva Signs, MD;  Location: [facility];  Service: General;  Laterality: N/A;   SHOULDER ARTHROSCOPY WITH ROTATOR CUFF REPAIR AND SUBACROMIAL DECOMPRESSION Right 09/23/2019   Procedure: RIGHT SHOULDER ARTHROSCOPY DEBRIDEMENT, ACROMIOPLASTY, DISTAL CLAVICAL EXCISION ROTATOR CUFF REPAIR;  Surgeon: Hiram Gash, MD;  Location: [facility];  Service: Orthopedics;  Laterality: Right;   TUBAL LIGATION      There were no vitals filed for this visit.   Subjective Assessment - 06/13/20 1640     Subjective COVID 19 screening performed on patient upon arrival. Reports feeling a pain earlier into R side of her neck but subsided. Patient can sleep better on R shoulder if she has R elbow extended.    Pertinent History Hypothyroidism, DM, h/o back pain, hernia repair, SCDF, OA.    Patient Stated Goals Use right arm without difficulty.    Currently in Pain? No/denies                [facility] PT Assessment - 06/13/20 0001  Assessment   Medical Diagnosis RT shld DCR, SAD, massive RTC repair.    Referring Provider (PT) Ophelia Charter MD    Onset Date/Surgical Date 09/23/19    Hand Dominance Right    Next MD Visit 07/2020      Precautions   Precaution Comments Follow protocol.      Observation/Other Assessments   Focus on Therapeutic Outcomes (FOTO)  14% limitation at 37th visit 06/13/2020      ROM / Strength   AROM / PROM / Strength AROM;Strength      AROM   Overall AROM  Deficits    AROM Assessment Site Shoulder    Right/Left Shoulder Right    Right Shoulder Internal Rotation --   Superior PSIS     Strength   Overall Strength Within functional limits for tasks performed    Strength Assessment Site Shoulder    Right/Left Shoulder Right    Right Shoulder Flexion 4+/5    Right  Shoulder ABduction 4+/5    Right Shoulder Internal Rotation 4+/5    Right Shoulder External Rotation 4+/5                           [facility] Adult PT Treatment/Exercise - 06/13/20 0001       Shoulder Exercises: Supine   Protraction AROM;Right;20 reps    Protraction Limitations self massage at posterior shoulder    Flexion AROM;Right;15 reps    Flexion Limitations self massage at posterior shoulder      Shoulder Exercises: Standing   Other Standing Exercises demonstrate self release technique with ball at wall      Shoulder Exercises: ROM/Strengthening   UBE (Upper Arm Bike) 60 RPM x8 min      Modalities   Modalities Vasopneumatic      Vasopneumatic   Number Minutes Vasopneumatic  10 minutes    Vasopnuematic Location  Shoulder    Vasopneumatic Pressure Low    Vasopneumatic Temperature  34/discomfort      Manual Therapy   Manual Therapy Passive ROM;Myofascial release;Joint mobilization    Joint Mobilization Grade I-II posterior glenohumeral mobilizations to improve mobility    Myofascial Release MFR of R latissimus to assist in improving IR of R shoulder    Passive ROM PROM of R shoulder into IR with holds at end range                         PT Long Term Goals - 06/13/20 1655       PT LONG TERM GOAL #1   Title Independent with a HEP.    Time 8    Period Weeks    Status Achieved      PT LONG TERM GOAL #2   Title Active right shoulder flexion to 145 degrees so the patient can easily reach overhead.    Time 8    Period Weeks    Status Achieved      PT LONG TERM GOAL #3   Title Active ER to 70 degrees+ to allow for easily donning/doffing of apparel.    Time 8    Period Weeks    Status Achieved   60     PT LONG TERM GOAL #4   Title Increase ROM so patient is able to reach behind back to L4.    Time 8    Period Weeks    Status Not Met   R glute  PT LONG TERM GOAL #5   Title Increase shoulder strength to a solid 4 to 4+/5 to  increase stability for performance of functional activities.    Time 8    Period Weeks    Status Achieved      PT LONG TERM GOAL #6   Title Perform ADL's with pain not > 3/10.    Time 8    Period Weeks    Status Achieved   at most 4/10                  Plan - 06/13/20 1751     Clinical Impression Statement Patient has progressed fairly well following R shoulder surgery. Patient is functional with all ADLs, work activities. Patient able to achieve all goals set at evaluation except for IR of R shoulder. Patient has been provided HEP for different IR stretching and continues to work diligently with them per patient report. Patient able to tolerate glenohumeral mobilizations, PROM and self massage to R posterior shoulder well in all efforts to improve active IR. Patient understanding that full recovery will require time and diligence with HEP. Normal vasopneumatic response noted following removal.    Personal Factors and Comorbidities Comorbidity 1;Comorbidity 2    Comorbidities Hypothyroidism, DM, h/o back pain, hernia repair, SCDF, OA.    Examination-Activity Limitations Reach Overhead;Other    Examination-Participation Restrictions Other    Stability/Clinical Decision Making Stable/Uncomplicated    Rehab Potential Excellent    PT Frequency 2x / week    PT Duration 8 weeks    PT Treatment/Interventions ADLs/Self Care Home Management;Cryotherapy;Electrical Stimulation;Ultrasound;Moist Heat;Therapeutic activities;Therapeutic exercise;Patient/family education;Passive range of motion;Vasopneumatic Device    PT Next Visit Plan D/C summary    Consulted and Agree with Plan of Care Patient             Patient will benefit from skilled therapeutic intervention in order to improve the following deficits and impairments:  Pain,Decreased activity tolerance,Increased edema,Decreased range of motion  Visit Diagnosis: Acute pain of right shoulder  Stiffness of right shoulder, not  elsewhere classified     Problem List Patient Active Problem List   Diagnosis Date Noted   Sebaceous cyst    Class 2 severe obesity with serious comorbidity and body mass index (BMI) of 35.0 to 35.9 in adult ([place]) 10/27/2017   Other fatigue 09/10/2017   Shortness of breath on exertion 09/10/2017   Type 2 diabetes mellitus without complication, without long-term current use of insulin ([place]) 09/10/2017   Essential hypertension 09/10/2017   Other hyperlipidemia 09/10/2017   Other specified hypothyroidism 09/10/2017   Herniated cervical disc 08/16/2016    Standley Brooking, PTA 06/13/20 5:55 PM   [facility] [address] [place], [place], [address] Phone: [phone]   Fax:  [phone]  Name: Crystal Roberts MRN: [id number] Date of Birth: 02/19/1955   PHYSICAL THERAPY DISCHARGE SUMMARY  Visits from Start of Care: 37.  Current functional level related to goals / functional outcomes: See above.   Remaining deficits: All but goal #4 met.   Education / Equipment: HEP.  Plan: Patient agrees to discharge.  Patient goals were partially met.                                                  being pleased with the current functional level.  Mali Applegate MPT

## 2020-06-16 DIAGNOSIS — H25813 Combined forms of age-related cataract, bilateral: Secondary | ICD-10-CM | POA: Diagnosis not present

## 2020-06-16 DIAGNOSIS — E119 Type 2 diabetes mellitus without complications: Secondary | ICD-10-CM | POA: Diagnosis not present

## 2020-06-16 DIAGNOSIS — H43813 Vitreous degeneration, bilateral: Secondary | ICD-10-CM | POA: Diagnosis not present

## 2020-06-16 DIAGNOSIS — D3132 Benign neoplasm of left choroid: Secondary | ICD-10-CM | POA: Diagnosis not present

## 2020-07-21 ENCOUNTER — Other Ambulatory Visit (HOSPITAL_COMMUNITY): Payer: Self-pay

## 2020-07-21 MED FILL — Fluoxetine HCl Cap 20 MG: ORAL | 90 days supply | Qty: 90 | Fill #0 | Status: AC

## 2020-07-24 ENCOUNTER — Other Ambulatory Visit (HOSPITAL_COMMUNITY): Payer: Self-pay

## 2020-07-24 MED ORDER — METFORMIN HCL 500 MG PO TABS
250.0000 mg | ORAL_TABLET | Freq: Two times a day (BID) | ORAL | 0 refills | Status: DC
  Filled 2020-07-24: qty 90, 90d supply, fill #0

## 2020-07-26 ENCOUNTER — Ambulatory Visit (INDEPENDENT_AMBULATORY_CARE_PROVIDER_SITE_OTHER): Payer: 59 | Admitting: Bariatrics

## 2020-08-04 DIAGNOSIS — M25511 Pain in right shoulder: Secondary | ICD-10-CM | POA: Diagnosis not present

## 2020-08-17 ENCOUNTER — Ambulatory Visit (INDEPENDENT_AMBULATORY_CARE_PROVIDER_SITE_OTHER): Payer: 59 | Admitting: Bariatrics

## 2020-08-29 DIAGNOSIS — J069 Acute upper respiratory infection, unspecified: Secondary | ICD-10-CM | POA: Diagnosis not present

## 2020-09-04 DIAGNOSIS — J209 Acute bronchitis, unspecified: Secondary | ICD-10-CM | POA: Diagnosis not present

## 2020-09-04 DIAGNOSIS — Z681 Body mass index (BMI) 19 or less, adult: Secondary | ICD-10-CM | POA: Diagnosis not present

## 2020-09-04 DIAGNOSIS — E063 Autoimmune thyroiditis: Secondary | ICD-10-CM | POA: Diagnosis not present

## 2020-09-04 DIAGNOSIS — J329 Chronic sinusitis, unspecified: Secondary | ICD-10-CM | POA: Diagnosis not present

## 2020-09-04 DIAGNOSIS — J9801 Acute bronchospasm: Secondary | ICD-10-CM | POA: Diagnosis not present

## 2020-09-04 DIAGNOSIS — E119 Type 2 diabetes mellitus without complications: Secondary | ICD-10-CM | POA: Diagnosis not present

## 2020-09-07 ENCOUNTER — Telehealth (INDEPENDENT_AMBULATORY_CARE_PROVIDER_SITE_OTHER): Payer: 59 | Admitting: Bariatrics

## 2020-09-07 ENCOUNTER — Other Ambulatory Visit (HOSPITAL_COMMUNITY): Payer: Self-pay

## 2020-09-07 DIAGNOSIS — E1169 Type 2 diabetes mellitus with other specified complication: Secondary | ICD-10-CM

## 2020-09-07 DIAGNOSIS — E559 Vitamin D deficiency, unspecified: Secondary | ICD-10-CM

## 2020-09-07 DIAGNOSIS — Z6841 Body Mass Index (BMI) 40.0 and over, adult: Secondary | ICD-10-CM

## 2020-09-07 DIAGNOSIS — E785 Hyperlipidemia, unspecified: Secondary | ICD-10-CM | POA: Diagnosis not present

## 2020-09-07 MED FILL — Losartan Potassium & Hydrochlorothiazide Tab 100-25 MG: ORAL | 60 days supply | Qty: 60 | Fill #1 | Status: AC

## 2020-09-08 ENCOUNTER — Other Ambulatory Visit (HOSPITAL_COMMUNITY): Payer: Self-pay

## 2020-09-08 MED ORDER — BUPROPION HCL ER (XL) 300 MG PO TB24
300.0000 mg | ORAL_TABLET | Freq: Every day | ORAL | 1 refills | Status: DC
  Filled 2020-09-08: qty 90, 90d supply, fill #0

## 2020-09-11 ENCOUNTER — Other Ambulatory Visit (HOSPITAL_COMMUNITY): Payer: Self-pay

## 2020-09-11 ENCOUNTER — Encounter (INDEPENDENT_AMBULATORY_CARE_PROVIDER_SITE_OTHER): Payer: Self-pay | Admitting: Bariatrics

## 2020-09-11 MED ORDER — SIMVASTATIN 40 MG PO TABS
40.0000 mg | ORAL_TABLET | Freq: Every day | ORAL | 1 refills | Status: DC
  Filled 2020-09-11: qty 90, 90d supply, fill #0

## 2020-09-11 MED ORDER — LEVOTHYROXINE SODIUM 75 MCG PO TABS
75.0000 ug | ORAL_TABLET | Freq: Every day | ORAL | 1 refills | Status: DC
  Filled 2020-09-11: qty 90, 90d supply, fill #0
  Filled 2021-01-29: qty 90, 90d supply, fill #1

## 2020-09-11 MED ORDER — MELOXICAM 15 MG PO TABS
15.0000 mg | ORAL_TABLET | Freq: Every day | ORAL | 1 refills | Status: DC
  Filled 2020-09-11: qty 90, 90d supply, fill #0
  Filled 2021-01-29: qty 90, 90d supply, fill #1

## 2020-09-11 NOTE — Progress Notes (Signed)
TeleHealth Visit:  Due to the COVID-19 pandemic, this visit was completed with telemedicine (audio/video) technology to reduce patient and provider exposure as well as to preserve personal protective equipment.   Crystal Roberts has verbally consented to this TeleHealth visit. The patient is located at home, the provider is located at the [facility] office. The participants in this visit include the listed provider and patient. The visit was conducted today via video.   Chief Complaint: OBESITY Crystal Roberts is here to discuss her progress with her obesity treatment plan along with follow-up of her obesity related diagnoses. Crystal Roberts is on the Category 2 Plan and states she is following her eating plan approximately 0% of the time. Crystal Roberts states she is doing 0 minutes 0 times per week.  Today's visit was #: 33 Starting weight: 241 lbs Starting date: 09/10/2017  Interim History: Takaya states that she has a "bad respiratory infection". She thinks that she may have gained 5 lbs. She was doing ok until she got sick.  Subjective:   1. Hyperlipidemia associated with type 2 diabetes mellitus (Crystal Roberts) Crystal Roberts is currently taking Simvastatin.  2. Vitamin D deficiency She is currently taking prescription vitamin D 50,000 IU each week. She denies nausea, vomiting or muscle weakness.  Assessment/Plan:   1. Hyperlipidemia associated with type 2 diabetes mellitus ([place]) Crystal Roberts will continue Simvastatin. Cardiovascular risk and specific lipid/LDL goals reviewed.  We discussed several lifestyle modifications today and Shinika will continue to work on diet, exercise and weight loss efforts. Orders and follow up as documented in patient record.   Counseling Intensive lifestyle modifications are the first line treatment for this issue. Dietary changes: Increase soluble fiber. Decrease simple carbohydrates. Exercise changes: Moderate to vigorous-intensity aerobic activity 150 minutes per week if  tolerated. Lipid-lowering medications: see documented in medical record.   2. Vitamin D deficiency Low Vitamin D level contributes to fatigue and are associated with obesity, breast, and colon cancer. Crystal Roberts will continue to take prescription Vitamin D 50,000 IU every week and will follow-up for routine testing of Vitamin D, at least 2-3 times per year to avoid over-replacement. We will check Vitamin D in the future.   3. Obesity, current BMI 41 Crystal Roberts is currently in the action stage of change. As such, her goal is to continue with weight loss efforts. She has agreed to the Category 2 Plan.   Crystal Roberts will continue meal planning. She will continue intentional eating.  Exercise goals:  [place] will continue yard work and walking.  Behavioral modification strategies: increasing lean protein intake, decreasing simple carbohydrates, increasing vegetables, increasing water intake, decreasing eating out, no skipping meals, meal planning and cooking strategies, keeping healthy foods in the home, and planning for success.  Crystal Roberts has agreed to follow-up with our clinic in 2-3 (month appointment). She was informed of the importance of frequent follow-up visits to maximize her success with intensive lifestyle modifications for her multiple health conditions.  Objective:   VITALS: Per patient if applicable, see vitals. GENERAL: Alert and in no acute distress. CARDIOPULMONARY: No increased WOB. Speaking in clear sentences.  PSYCH: Pleasant and cooperative. Speech normal rate and rhythm. Affect is appropriate. Insight and judgement are appropriate. Attention is focused, linear, and appropriate.  NEURO: Oriented as arrived to appointment on time with no prompting.   Lab Results  Component Value Date   CREATININE 0.83 01/05/2020   BUN 12 01/05/2020   NA 141 01/05/2020   K 4.6 01/05/2020   CL 102 01/05/2020  CO2 24 01/05/2020   Lab Results  Component Value Date   ALT 22 01/05/2020   AST 24  01/05/2020   ALKPHOS 95 01/05/2020   BILITOT 0.2 01/05/2020   Lab Results  Component Value Date   HGBA1C 6.0 (H) 11/18/2017   HGBA1C 6.1 (H) 09/10/2017   HGBA1C 5.8 (H) 08/13/2016   Lab Results  Component Value Date   INSULIN 16.8 01/05/2020   INSULIN 27.5 (H) 09/10/2017   Lab Results  Component Value Date   TSH 1.080 01/05/2020   Lab Results  Component Value Date   CHOL 196 01/05/2020   HDL 67 01/05/2020   LDLCALC 109 (H) 01/05/2020   TRIG 113 01/05/2020   Lab Results  Component Value Date   VD25OH 43.8 01/05/2020   VD25OH 46.3 09/10/2017   Lab Results  Component Value Date   WBC 8.6 11/18/2017   HGB 13.9 11/18/2017   HCT 43.8 11/18/2017   MCV 93.8 11/18/2017   PLT 361 11/18/2017   No results found for: IRON, TIBC, FERRITIN  Attestation Statements:   Reviewed by clinician on day of visit: allergies, medications, problem list, medical history, surgical history, family history, social history, and previous encounter notes.  I, Lizbeth Bark, RMA, am acting as Location manager for [employer], DO.   I have reviewed the above documentation for accuracy and completeness, and I agree with the above. Jearld Lesch, DO

## 2020-09-14 DIAGNOSIS — M25511 Pain in right shoulder: Secondary | ICD-10-CM | POA: Diagnosis not present

## 2020-10-16 ENCOUNTER — Other Ambulatory Visit (HOSPITAL_COMMUNITY): Payer: Self-pay

## 2020-10-16 MED ORDER — METFORMIN HCL 500 MG PO TABS
250.0000 mg | ORAL_TABLET | Freq: Two times a day (BID) | ORAL | 0 refills | Status: DC
  Filled 2020-10-16: qty 90, 90d supply, fill #0

## 2020-11-23 ENCOUNTER — Other Ambulatory Visit (HOSPITAL_COMMUNITY): Payer: Self-pay | Admitting: Family Medicine

## 2020-11-23 DIAGNOSIS — Z1231 Encounter for screening mammogram for malignant neoplasm of breast: Secondary | ICD-10-CM

## 2020-11-27 ENCOUNTER — Other Ambulatory Visit: Payer: Self-pay

## 2020-11-27 ENCOUNTER — Ambulatory Visit (HOSPITAL_COMMUNITY)
Admission: RE | Admit: 2020-11-27 | Discharge: 2020-11-27 | Disposition: A | Discharge status: Home / Self Care | Payer: 59 | Source: Ambulatory Visit | Attending: Family Medicine | Admitting: Family Medicine

## 2020-11-27 DIAGNOSIS — Z1231 Encounter for screening mammogram for malignant neoplasm of breast: Secondary | ICD-10-CM | POA: Insufficient documentation

## 2020-12-11 ENCOUNTER — Emergency Department (HOSPITAL_COMMUNITY): Payer: 59

## 2020-12-11 ENCOUNTER — Emergency Department (HOSPITAL_COMMUNITY)
Admission: EM | Admit: 2020-12-11 | Discharge: 2020-12-11 | Disposition: A | Discharge status: Home / Self Care | Payer: 59 | Attending: Emergency Medicine | Admitting: Emergency Medicine

## 2020-12-11 ENCOUNTER — Other Ambulatory Visit: Payer: Self-pay

## 2020-12-11 ENCOUNTER — Encounter (HOSPITAL_COMMUNITY): Payer: Self-pay | Admitting: *Deleted

## 2020-12-11 DIAGNOSIS — Y92410 Unspecified street and highway as the place of occurrence of the external cause: Secondary | ICD-10-CM | POA: Insufficient documentation

## 2020-12-11 DIAGNOSIS — S60222A Contusion of left hand, initial encounter: Secondary | ICD-10-CM | POA: Insufficient documentation

## 2020-12-11 DIAGNOSIS — S6992XA Unspecified injury of left wrist, hand and finger(s), initial encounter: Secondary | ICD-10-CM | POA: Diagnosis present

## 2020-12-11 DIAGNOSIS — E039 Hypothyroidism, unspecified: Secondary | ICD-10-CM | POA: Diagnosis not present

## 2020-12-11 DIAGNOSIS — Z85828 Personal history of other malignant neoplasm of skin: Secondary | ICD-10-CM | POA: Diagnosis not present

## 2020-12-11 DIAGNOSIS — R1031 Right lower quadrant pain: Secondary | ICD-10-CM | POA: Insufficient documentation

## 2020-12-11 DIAGNOSIS — M542 Cervicalgia: Secondary | ICD-10-CM | POA: Insufficient documentation

## 2020-12-11 DIAGNOSIS — M79642 Pain in left hand: Secondary | ICD-10-CM | POA: Diagnosis not present

## 2020-12-11 DIAGNOSIS — Z79899 Other long term (current) drug therapy: Secondary | ICD-10-CM | POA: Insufficient documentation

## 2020-12-11 DIAGNOSIS — M549 Dorsalgia, unspecified: Secondary | ICD-10-CM | POA: Diagnosis not present

## 2020-12-11 DIAGNOSIS — M7918 Myalgia, other site: Secondary | ICD-10-CM

## 2020-12-11 DIAGNOSIS — R1032 Left lower quadrant pain: Secondary | ICD-10-CM | POA: Diagnosis not present

## 2020-12-11 DIAGNOSIS — Z87891 Personal history of nicotine dependence: Secondary | ICD-10-CM | POA: Insufficient documentation

## 2020-12-11 DIAGNOSIS — Z7982 Long term (current) use of aspirin: Secondary | ICD-10-CM | POA: Insufficient documentation

## 2020-12-11 DIAGNOSIS — Z7984 Long term (current) use of oral hypoglycemic drugs: Secondary | ICD-10-CM | POA: Insufficient documentation

## 2020-12-11 DIAGNOSIS — R29890 Loss of height: Secondary | ICD-10-CM | POA: Diagnosis not present

## 2020-12-11 DIAGNOSIS — Z8616 Personal history of COVID-19: Secondary | ICD-10-CM | POA: Insufficient documentation

## 2020-12-11 DIAGNOSIS — J45909 Unspecified asthma, uncomplicated: Secondary | ICD-10-CM | POA: Diagnosis not present

## 2020-12-11 DIAGNOSIS — E119 Type 2 diabetes mellitus without complications: Secondary | ICD-10-CM | POA: Diagnosis not present

## 2020-12-11 DIAGNOSIS — M2578 Osteophyte, vertebrae: Secondary | ICD-10-CM | POA: Diagnosis not present

## 2020-12-11 DIAGNOSIS — M25532 Pain in left wrist: Secondary | ICD-10-CM | POA: Diagnosis not present

## 2020-12-11 DIAGNOSIS — M79645 Pain in left finger(s): Secondary | ICD-10-CM | POA: Diagnosis not present

## 2020-12-11 DIAGNOSIS — M791 Myalgia, unspecified site: Secondary | ICD-10-CM | POA: Insufficient documentation

## 2020-12-11 NOTE — Discharge Instructions (Addendum)
Return if any problems.
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
## 2020-12-13 ENCOUNTER — Other Ambulatory Visit: Payer: Self-pay

## 2020-12-13 ENCOUNTER — Encounter (INDEPENDENT_AMBULATORY_CARE_PROVIDER_SITE_OTHER): Payer: Self-pay | Admitting: Adult Health

## 2020-12-13 ENCOUNTER — Telehealth (INDEPENDENT_AMBULATORY_CARE_PROVIDER_SITE_OTHER): Payer: 59 | Admitting: Adult Health

## 2020-12-13 DIAGNOSIS — E119 Type 2 diabetes mellitus without complications: Secondary | ICD-10-CM

## 2020-12-13 DIAGNOSIS — Z6841 Body Mass Index (BMI) 40.0 and over, adult: Secondary | ICD-10-CM

## 2020-12-13 DIAGNOSIS — E559 Vitamin D deficiency, unspecified: Secondary | ICD-10-CM | POA: Insufficient documentation
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
## 2020-12-21 DIAGNOSIS — M545 Low back pain, unspecified: Secondary | ICD-10-CM | POA: Diagnosis not present

## 2020-12-28 DIAGNOSIS — M25511 Pain in right shoulder: Secondary | ICD-10-CM | POA: Diagnosis not present

## 2021-01-04 DIAGNOSIS — M545 Low back pain, unspecified: Secondary | ICD-10-CM | POA: Diagnosis not present

## 2021-01-04 DIAGNOSIS — G8929 Other chronic pain: Secondary | ICD-10-CM | POA: Diagnosis not present

## 2021-01-04 DIAGNOSIS — M542 Cervicalgia: Secondary | ICD-10-CM | POA: Diagnosis not present

## 2021-01-05 DIAGNOSIS — M545 Low back pain, unspecified: Secondary | ICD-10-CM | POA: Diagnosis not present

## 2021-01-05 DIAGNOSIS — M25511 Pain in right shoulder: Secondary | ICD-10-CM | POA: Diagnosis not present

## 2021-01-11 ENCOUNTER — Other Ambulatory Visit: Payer: Self-pay | Admitting: Neurological Surgery

## 2021-01-11 DIAGNOSIS — G8929 Other chronic pain: Secondary | ICD-10-CM

## 2021-01-11 DIAGNOSIS — M542 Cervicalgia: Secondary | ICD-10-CM

## 2021-01-29 ENCOUNTER — Other Ambulatory Visit (HOSPITAL_COMMUNITY): Payer: Self-pay

## 2021-02-03 ENCOUNTER — Ambulatory Visit
Admission: RE | Admit: 2021-02-03 | Discharge: 2021-02-03 | Disposition: A | Discharge status: Home / Self Care | Payer: 59 | Source: Ambulatory Visit | Attending: Neurological Surgery | Admitting: Neurological Surgery

## 2021-02-03 ENCOUNTER — Other Ambulatory Visit: Payer: Self-pay

## 2021-02-03 DIAGNOSIS — M545 Low back pain, unspecified: Secondary | ICD-10-CM | POA: Diagnosis not present

## 2021-02-03 DIAGNOSIS — M542 Cervicalgia: Secondary | ICD-10-CM

## 2021-02-03 DIAGNOSIS — G8929 Other chronic pain: Secondary | ICD-10-CM

## 2021-02-03 DIAGNOSIS — M4802 Spinal stenosis, cervical region: Secondary | ICD-10-CM | POA: Diagnosis not present

## 2021-02-12 DIAGNOSIS — M5020 Other cervical disc displacement, unspecified cervical region: Secondary | ICD-10-CM | POA: Diagnosis not present

## 2021-02-12 DIAGNOSIS — M545 Low back pain, unspecified: Secondary | ICD-10-CM | POA: Diagnosis not present

## 2021-02-12 DIAGNOSIS — I10 Essential (primary) hypertension: Secondary | ICD-10-CM | POA: Diagnosis not present

## 2021-02-16 ENCOUNTER — Other Ambulatory Visit (HOSPITAL_COMMUNITY): Payer: Self-pay

## 2021-02-16 DIAGNOSIS — M545 Low back pain, unspecified: Secondary | ICD-10-CM | POA: Diagnosis not present

## 2021-02-20 ENCOUNTER — Other Ambulatory Visit (HOSPITAL_COMMUNITY): Payer: Self-pay

## 2021-02-20 DIAGNOSIS — I10 Essential (primary) hypertension: Secondary | ICD-10-CM | POA: Diagnosis not present

## 2021-02-20 DIAGNOSIS — Z6841 Body Mass Index (BMI) 40.0 and over, adult: Secondary | ICD-10-CM | POA: Diagnosis not present

## 2021-02-20 DIAGNOSIS — E118 Type 2 diabetes mellitus with unspecified complications: Secondary | ICD-10-CM | POA: Diagnosis not present

## 2021-02-20 DIAGNOSIS — M1991 Primary osteoarthritis, unspecified site: Secondary | ICD-10-CM | POA: Diagnosis not present

## 2021-02-20 DIAGNOSIS — E119 Type 2 diabetes mellitus without complications: Secondary | ICD-10-CM | POA: Diagnosis not present

## 2021-02-20 DIAGNOSIS — E063 Autoimmune thyroiditis: Secondary | ICD-10-CM | POA: Diagnosis not present

## 2021-02-20 DIAGNOSIS — E039 Hypothyroidism, unspecified: Secondary | ICD-10-CM | POA: Diagnosis not present

## 2021-02-20 DIAGNOSIS — E782 Mixed hyperlipidemia: Secondary | ICD-10-CM | POA: Diagnosis not present

## 2021-02-20 MED ORDER — METFORMIN HCL 500 MG PO TABS
250.0000 mg | ORAL_TABLET | Freq: Two times a day (BID) | ORAL | 1 refills | Status: DC
Start: 2021-02-20 — End: 2021-06-05
  Filled 2021-02-20: qty 90, 90d supply, fill #0

## 2021-02-20 MED ORDER — SIMVASTATIN 40 MG PO TABS
40.0000 mg | ORAL_TABLET | Freq: Every day | ORAL | 1 refills | Status: DC
  Filled 2021-02-20: qty 90, 90d supply, fill #0

## 2021-02-20 MED ORDER — LOSARTAN POTASSIUM-HCTZ 100-25 MG PO TABS
1.0000 | ORAL_TABLET | Freq: Every day | ORAL | 1 refills | Status: DC
Start: 2021-02-20 — End: 2021-11-08
  Filled 2021-02-20: qty 90, 90d supply, fill #0
  Filled 2021-06-05: qty 90, 90d supply, fill #1

## 2021-02-20 MED ORDER — AMLODIPINE BESYLATE 10 MG PO TABS
10.0000 mg | ORAL_TABLET | Freq: Every day | ORAL | 1 refills | Status: DC
  Filled 2021-02-20: qty 90, 90d supply, fill #0
  Filled 2021-06-05: qty 90, 90d supply, fill #1

## 2021-02-20 MED ORDER — MELOXICAM 15 MG PO TABS
15.0000 mg | ORAL_TABLET | Freq: Every day | ORAL | 0 refills | Status: DC
  Filled 2021-02-20 – 2021-06-05 (×2): qty 90, 90d supply, fill #0

## 2021-02-20 MED ORDER — BUPROPION HCL ER (XL) 300 MG PO TB24
300.0000 mg | ORAL_TABLET | Freq: Every day | ORAL | 1 refills | Status: DC
  Filled 2021-02-20: qty 90, 90d supply, fill #0

## 2021-02-20 MED ORDER — LEVOTHYROXINE SODIUM 75 MCG PO TABS
75.0000 ug | ORAL_TABLET | Freq: Every day | ORAL | 1 refills | Status: DC
  Filled 2021-02-20: qty 90, 90d supply, fill #0
  Filled 2021-06-05: qty 46, 46d supply, fill #0
  Filled 2021-06-05: qty 44, 44d supply, fill #0
  Filled 2021-08-29: qty 90, 90d supply, fill #1

## 2021-02-21 ENCOUNTER — Other Ambulatory Visit (HOSPITAL_COMMUNITY): Payer: Self-pay

## 2021-02-21 MED ORDER — ROSUVASTATIN CALCIUM 20 MG PO TABS
20.0000 mg | ORAL_TABLET | Freq: Every day | ORAL | 1 refills | Status: DC
Start: 2021-02-21 — End: 2021-11-08
  Filled 2021-02-21: qty 90, 90d supply, fill #0
  Filled 2021-06-05: qty 90, 90d supply, fill #1

## 2021-02-21 MED ORDER — FLUOXETINE HCL 20 MG PO CAPS
20.0000 mg | ORAL_CAPSULE | Freq: Every morning | ORAL | 1 refills | Status: DC
  Filled 2021-02-21: qty 90, 90d supply, fill #0
  Filled 2021-06-05: qty 90, 90d supply, fill #1

## 2021-02-22 ENCOUNTER — Other Ambulatory Visit: Payer: Self-pay

## 2021-02-22 ENCOUNTER — Encounter (INDEPENDENT_AMBULATORY_CARE_PROVIDER_SITE_OTHER): Payer: Self-pay | Admitting: Bariatrics

## 2021-02-22 ENCOUNTER — Ambulatory Visit (INDEPENDENT_AMBULATORY_CARE_PROVIDER_SITE_OTHER): Payer: 59 | Admitting: Bariatrics

## 2021-02-22 ENCOUNTER — Ambulatory Visit: Payer: 59 | Admitting: Physical Therapy

## 2021-02-22 VITALS — BP 146/82 | HR 78 | Temp 98.1°F | Ht 64.0 in | Wt 249.0 lb

## 2021-02-22 DIAGNOSIS — E1169 Type 2 diabetes mellitus with other specified complication: Secondary | ICD-10-CM

## 2021-02-22 DIAGNOSIS — E669 Obesity, unspecified: Secondary | ICD-10-CM | POA: Diagnosis not present

## 2021-02-22 DIAGNOSIS — E785 Hyperlipidemia, unspecified: Secondary | ICD-10-CM

## 2021-02-22 DIAGNOSIS — E119 Type 2 diabetes mellitus without complications: Secondary | ICD-10-CM

## 2021-02-22 DIAGNOSIS — Z6841 Body Mass Index (BMI) 40.0 and over, adult: Secondary | ICD-10-CM
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
## 2021-02-27 ENCOUNTER — Encounter (INDEPENDENT_AMBULATORY_CARE_PROVIDER_SITE_OTHER): Payer: Self-pay | Admitting: Bariatrics

## 2021-02-27 DIAGNOSIS — M5416 Radiculopathy, lumbar region: Secondary | ICD-10-CM | POA: Diagnosis not present

## 2021-03-02 ENCOUNTER — Other Ambulatory Visit (HOSPITAL_COMMUNITY): Payer: Self-pay

## 2021-03-14 ENCOUNTER — Other Ambulatory Visit: Payer: Self-pay

## 2021-03-22 DIAGNOSIS — M5416 Radiculopathy, lumbar region: Secondary | ICD-10-CM | POA: Diagnosis not present

## 2021-04-05 ENCOUNTER — Ambulatory Visit (INDEPENDENT_AMBULATORY_CARE_PROVIDER_SITE_OTHER): Payer: 59 | Admitting: Bariatrics

## 2021-04-05 ENCOUNTER — Other Ambulatory Visit: Payer: Self-pay

## 2021-04-05 ENCOUNTER — Encounter (INDEPENDENT_AMBULATORY_CARE_PROVIDER_SITE_OTHER): Payer: Self-pay | Admitting: Bariatrics

## 2021-04-05 ENCOUNTER — Other Ambulatory Visit (HOSPITAL_COMMUNITY): Payer: Self-pay

## 2021-04-05 VITALS — BP 149/83 | HR 74 | Temp 98.2°F | Ht 64.0 in | Wt 249.0 lb

## 2021-04-05 DIAGNOSIS — R5383 Other fatigue: Secondary | ICD-10-CM

## 2021-04-05 DIAGNOSIS — I10 Essential (primary) hypertension: Secondary | ICD-10-CM

## 2021-04-05 DIAGNOSIS — E669 Obesity, unspecified: Secondary | ICD-10-CM

## 2021-04-05 DIAGNOSIS — Z6841 Body Mass Index (BMI) 40.0 and over, adult: Secondary | ICD-10-CM

## 2021-04-05 DIAGNOSIS — E1169 Type 2 diabetes mellitus with other specified complication: Secondary | ICD-10-CM | POA: Diagnosis not present

## 2021-04-05 DIAGNOSIS — R0602 Shortness of breath: Secondary | ICD-10-CM | POA: Diagnosis not present

## 2021-04-05 DIAGNOSIS — Z7984 Long term (current) use of oral hypoglycemic drugs: Secondary | ICD-10-CM | POA: Diagnosis not present

## 2021-04-05 MED ORDER — TIRZEPATIDE 2.5 MG/0.5ML ~~LOC~~ SOAJ
2.5000 mg | SUBCUTANEOUS | 0 refills | Status: DC
  Filled 2021-04-05: qty 2, 28d supply, fill #0

## 2021-04-09 ENCOUNTER — Encounter (INDEPENDENT_AMBULATORY_CARE_PROVIDER_SITE_OTHER): Payer: Self-pay | Admitting: Bariatrics

## 2021-04-09 NOTE — Progress Notes (Signed)
? ? ? ?Chief Complaint:  ? ?OBESITY ?Crystal Roberts is here to discuss her progress with her obesity treatment plan along with follow-up of her obesity related diagnoses. Crystal Roberts is on the Category 2 Plan and states she is following her eating plan approximately 30-35% of the time. Crystal Roberts states she is doing 0 minutes 0 times per week. ? ?Today's visit was #: 20 ?Starting weight: 241 lbs ?Starting date: 09/10/2017 ?Today's weight: 249 lbs ?Today's date: 04/05/2021 ?Total lbs lost to date: 0 ?Total lbs lost since last in-office visit: 0 ? ?Interim History: Crystal Roberts's weight remains the same as her previous visit.  ? ?Subjective:  ? ?1. Other fatigue/Shortness of breath on exertion ?Crystal Roberts has a previous IC  on 08/2017 it was 2097. Today it was 950. It has been more decreasing.  ? ?2. Diabetes mellitus type 2 in obese [facility]) ?Crystal Roberts is taking Metformin currently.  ? ?3. Essential hypertension ?Crystal Roberts is currently taking Norvasc and Hyzaar. Her blood pressure is slightly elevated 149/83. ? ?Assessment/Plan:  ? ?1. Other fatigue/shortness of breath on exertion ?We discussed IC in some details. [name] does feel that her weight is causing her energy to be lower than it should be. Fatigue may be related to obesity, depression or many other causes. Labs will be ordered, and in the meanwhile, Nikeshia will focus on self care including making healthy food choices, increasing physical activity and focusing on stress reduction.  ? ?2. Diabetes mellitus type 2 in obese [facility]) ?Crystal Roberts will continue taking Metformin. We will refill Mounjaro 2.5 mg for 1 month with no refills. Good blood sugar control is important to decrease the likelihood of diabetic complications such as nephropathy, neuropathy, limb loss, blindness, coronary artery disease, and death. Intensive lifestyle modification including diet, exercise and weight loss are the first line of treatment for diabetes.  ? ?- tirzepatide (MOUNJARO) 2.5 MG/0.5ML Pen; Inject 2.5 mg into the skin once a  week.  Dispense: 2 mL; Refill: 0 ? ?3. Essential hypertension ?Crystal Roberts will continue her medications. She is working on healthy weight loss and exercise to improve blood pressure control. We will watch for signs of hypotension as she continues her lifestyle modifications. ? ?4. Obesity, current BMI 42.6 ?Crystal Roberts is currently in the action stage of change. As such, her goal is to continue with weight loss efforts. She has agreed to the Category 2 Plan and following a lower carbohydrate, vegetable and lean protein rich diet plan.  ? ?Crystal Roberts will continue meal planning and she will continue intentional eating.  ? ?Exercise goals: No exercise has been prescribed at this time. ? ?Behavioral modification strategies: increasing lean protein intake, decreasing simple carbohydrates, increasing vegetables, increasing water intake, decreasing eating out, no skipping meals, meal planning and cooking strategies, keeping healthy foods in the home, and planning for success. ? ?Crystal Roberts has agreed to follow-up with our clinic in 4 weeks. She was informed of the importance of frequent follow-up visits to maximize her success with intensive lifestyle modifications for her multiple health conditions.  ? ?Objective:  ? ?Blood pressure (!) 149/83, pulse 74, temperature 98.2 ?F (36.8 ?C), height '5\' 4"'$  (1.626 m), weight 249 lb (112.9 kg), SpO2 94 %. ?Body mass index is 42.74 kg/m?. ? ?General: Cooperative, alert, well developed, in no acute distress. ?HEENT: Conjunctivae and lids unremarkable. ?Cardiovascular: Regular rhythm.  ?Lungs: Normal work of breathing. ?Neurologic: No focal deficits.  ? ?Lab Results  ?Component Value Date  ? CREATININE 0.83 01/05/2020  ? BUN 12 01/05/2020  ? NA 141  01/05/2020  ? K 4.6 01/05/2020  ? CL 102 01/05/2020  ? CO2 24 01/05/2020  ? ?Lab Results  ?Component Value Date  ? ALT 22 01/05/2020  ? AST 24 01/05/2020  ? ALKPHOS 95 01/05/2020  ? BILITOT 0.2 01/05/2020  ? ?Lab Results  ?Component Value Date  ? HGBA1C 6.0 (H)  11/18/2017  ? HGBA1C 6.1 (H) 09/10/2017  ? HGBA1C 5.8 (H) 08/13/2016  ? ?Lab Results  ?Component Value Date  ? INSULIN 16.8 01/05/2020  ? INSULIN 27.5 (H) 09/10/2017  ? ?Lab Results  ?Component Value Date  ? TSH 1.080 01/05/2020  ? ?Lab Results  ?Component Value Date  ? CHOL 196 01/05/2020  ? HDL 67 01/05/2020  ? LDLCALC 109 (H) 01/05/2020  ? TRIG 113 01/05/2020  ? ?Lab Results  ?Component Value Date  ? VD25OH 43.8 01/05/2020  ? VD25OH 46.3 09/10/2017  ? ?Lab Results  ?Component Value Date  ? WBC 8.6 11/18/2017  ? HGB 13.9 11/18/2017  ? HCT 43.8 11/18/2017  ? MCV 93.8 11/18/2017  ? PLT 361 11/18/2017  ? ?No results found for: IRON, TIBC, FERRITIN ? ?Attestation Statements:  ? ?Reviewed by clinician on day of visit: allergies, medications, problem list, medical history, surgical history, family history, social history, and previous encounter notes. ? ?Time spent on visit including pre-visit chart review and post-visit care and charting was 30 minutes.  ? ?I, Lizbeth Bark, RMA, am acting as transcriptionist for [employer], DO. ? ?I have reviewed the above documentation for accuracy and completeness, and I agree with the above. Jearld Lesch, DO  ?

## 2021-04-23 ENCOUNTER — Encounter: Payer: Self-pay | Admitting: Adult Health

## 2021-04-23 ENCOUNTER — Ambulatory Visit (INDEPENDENT_AMBULATORY_CARE_PROVIDER_SITE_OTHER): Payer: 59 | Admitting: Adult Health

## 2021-04-23 VITALS — BP 152/72 | HR 66 | Ht 64.25 in | Wt 249.0 lb

## 2021-04-23 DIAGNOSIS — N950 Postmenopausal bleeding: Secondary | ICD-10-CM | POA: Diagnosis not present

## 2021-04-23 NOTE — Progress Notes (Signed)
?  Subjective:  ?  ? Patient ID: Crystal Roberts, female   DOB: [birth year], 66 y.o.   MRN: [id number] ? ?HPI ?Crystal Roberts is a 66 year old white female, widowed, PM, in complaining of having red bleeding the weekend for 3 days when wiped, denies any pain. ?She works at [employer]. ?PCP is Dr Hilma Favors. ?Lab Results  ?Component Value Date  ? DIAGPAP  09/03/2019  ?  - Negative for intraepithelial lesion or malignancy (NILM)  ? HPV NOT DETECTED 07/11/2017  ? [place] Negative 09/03/2019  ?  ?Review of Systems ?Vaginal bleeding  for 3 days  ?Denis any pain ?  Reviewed past medical,surgical, social and family history. Reviewed medications and allergies.  ?Objective:  ? Physical Exam ?BP (!) 152/72 (BP Location: Right Arm, Patient Position: Sitting, Cuff Size: Large)   Pulse 66   Ht 5' 4.25" (1.632 m)   Wt 249 lb (112.9 kg)   BMI 42.41 kg/m?   ?  Skin warm and dry.Pelvic: external genitalia is normal in appearance no lesions, vagina: pale with loss of rugae,no bleeding today,urethra has no lesions or masses noted, cervix:smooth, uterus: normal size, shape and contour, non tender, no masses felt, adnexa: no masses or tenderness noted. Bladder is non tender and no masses felt. ? ?Assessment:  ?   ?1. PMB (postmenopausal bleeding) ?Pelvic US scheduled for her 04/25/21 at 3:15 pm at [facility], to assess ovaries and uterus and she is aware of endometrium thickened will need endometrial biopsy ?- US PELVIC COMPLETE WITH TRANSVAGINAL; Future  ?  Review handout on PMB ? ?Plan:  ?   ?Follow up TBD ?   ?

## 2021-04-25 ENCOUNTER — Ambulatory Visit (HOSPITAL_COMMUNITY)
Admission: RE | Admit: 2021-04-25 | Discharge: 2021-04-25 | Disposition: A | Discharge status: Home / Self Care | Payer: 59 | Source: Ambulatory Visit | Attending: Adult Health | Admitting: Adult Health

## 2021-04-25 DIAGNOSIS — D259 Leiomyoma of uterus, unspecified: Secondary | ICD-10-CM | POA: Diagnosis not present

## 2021-04-25 DIAGNOSIS — N950 Postmenopausal bleeding: Secondary | ICD-10-CM | POA: Insufficient documentation

## 2021-04-26 ENCOUNTER — Telehealth: Payer: Self-pay | Admitting: Adult Health

## 2021-04-26 NOTE — Telephone Encounter (Signed)
Patient would like you to give her a call with her [place] results. Please advise.  ?

## 2021-04-26 NOTE — Telephone Encounter (Signed)
Pt aware of [place], EEC 4 mm will get endometrial biopsy, she wants Dr Nelda Marseille  ?

## 2021-04-26 NOTE — Telephone Encounter (Signed)
Pt wants to know about her ultrasound results. ?

## 2021-04-30 ENCOUNTER — Telehealth: Payer: Self-pay | Admitting: Adult Health

## 2021-04-30 NOTE — Telephone Encounter (Signed)
Left voicemail.  We can review the importance of the biopsy at her next visit.  ?Discussed that this is necessary to r/o malignancy prior to surgery ? ?Janyth Pupa, DO ?Attending [place], Faculty Practice ?[facility] [place] ? ? ?

## 2021-04-30 NOTE — Telephone Encounter (Signed)
Pt wanting to know why the endometrial biopsy is necessary if she wants to have a hysterectomy. Derrek Monaco not in the office. Will route to Dr. Nelda Marseille for her input.  ?

## 2021-04-30 NOTE — Telephone Encounter (Signed)
Patient wants to talk to you about why she needs biopsy if she wants to move forward with a hysterectomy. Please advise. Patient is aware that you are in class til Friday.  ?

## 2021-05-07 ENCOUNTER — Ambulatory Visit (INDEPENDENT_AMBULATORY_CARE_PROVIDER_SITE_OTHER): Payer: 59 | Admitting: Bariatrics

## 2021-05-07 ENCOUNTER — Other Ambulatory Visit (HOSPITAL_COMMUNITY): Payer: Self-pay

## 2021-05-07 ENCOUNTER — Encounter (INDEPENDENT_AMBULATORY_CARE_PROVIDER_SITE_OTHER): Payer: Self-pay | Admitting: Bariatrics

## 2021-05-07 VITALS — BP 151/83 | HR 68 | Temp 98.3°F | Ht 64.0 in | Wt 247.0 lb

## 2021-05-07 DIAGNOSIS — E669 Obesity, unspecified: Secondary | ICD-10-CM

## 2021-05-07 DIAGNOSIS — I10 Essential (primary) hypertension: Secondary | ICD-10-CM | POA: Diagnosis not present

## 2021-05-07 DIAGNOSIS — Z7985 Long-term (current) use of injectable non-insulin antidiabetic drugs: Secondary | ICD-10-CM | POA: Diagnosis not present

## 2021-05-07 DIAGNOSIS — Z6841 Body Mass Index (BMI) 40.0 and over, adult: Secondary | ICD-10-CM

## 2021-05-07 DIAGNOSIS — E1169 Type 2 diabetes mellitus with other specified complication: Secondary | ICD-10-CM | POA: Diagnosis not present

## 2021-05-07 MED ORDER — TIRZEPATIDE 5 MG/0.5ML ~~LOC~~ SOAJ
5.0000 mg | SUBCUTANEOUS | 0 refills | Status: DC
  Filled 2021-05-07: qty 2, 28d supply, fill #0

## 2021-05-07 MED ORDER — TIRZEPATIDE 2.5 MG/0.5ML ~~LOC~~ SOAJ
2.5000 mg | SUBCUTANEOUS | 0 refills | Status: DC
  Filled 2021-05-07: qty 2, 28d supply, fill #0

## 2021-05-14 NOTE — Progress Notes (Signed)
? ? ? ?Chief Complaint:  ? ?OBESITY ?Crystal Roberts is here to discuss her progress with her obesity treatment plan along with follow-up of her obesity related diagnoses. Crystal Roberts is on the Category 2 Plan and states she is following her eating plan approximately 25% of the time. Crystal Roberts states she is walking 10-15 minutes 3 times per week. ? ?Today's visit was #: 21 ?Starting weight: 241 lbs ?Starting date: 09/10/2017 ?Today's weight: 247 lbs ?Today's date: 05/07/2021 ?Total lbs lost to date: 2 lbs ?Total lbs lost since last in-office visit: 2 lbs ? ?Interim History: Crystal Roberts is down 2 lbs since her last visit and is trying to get back on track. ? ?Subjective:  ? ?1. Diabetes mellitus type 2 in obese [facility]) ?Crystal Roberts started taking Mounjaro at her last visit.  ? ?2. Essential hypertension ?Crystal Roberts is currently taking Norvasc and Hyzaar. Her blood pressure is slightly elevated today,151/83. ? ?Assessment/Plan:  ? ?1. Diabetes mellitus type 2 in obese [facility]) ?We will increase Mounjaro 5 mg for 1 month with no refills. Good blood sugar control is important to decrease the likelihood of diabetic complications such as nephropathy, neuropathy, limb loss, blindness, coronary artery disease, and death. Intensive lifestyle modification including diet, exercise and weight loss are the first line of treatment for diabetes.  ? ?- tirzepatide (MOUNJARO) 5 MG/0.5ML Pen; Inject 5 mg into the skin once a week.  Dispense: 2 mL; Refill: 0 ? ?2. Essential hypertension ?Crystal Roberts will continue her medications. She is working on healthy weight loss and exercise to improve blood pressure control. We will watch for signs of hypotension as she continues her lifestyle modifications. ? ?3. Obesity, current BMI 42.4 ?Crystal Roberts is currently in the action stage of change. As such, her goal is to continue with weight loss efforts. She has agreed to the Category 2 Plan.  ? ?Crystal Roberts will continue meal planning and she will continue intentional eating.  ? ?Exercise goals: As  is. ? ?Behavioral modification strategies: increasing lean protein intake, decreasing simple carbohydrates, increasing vegetables, increasing water intake, decreasing eating out, no skipping meals, meal planning and cooking strategies, keeping healthy foods in the home, and planning for success. ? ?Crystal Roberts has agreed to follow-up with our clinic in 4 weeks, fasting to repeat IC.  She was informed of the importance of frequent follow-up visits to maximize her success with intensive lifestyle modifications for her multiple health conditions.  ? ?Objective:  ? ?Blood pressure (!) 151/83, pulse 68, temperature 98.3 ?F (36.8 ?C), height '5\' 4"'$  (1.626 m), weight 247 lb (112 kg), SpO2 97 %. ?Body mass index is 42.4 kg/m?. ? ?General: Cooperative, alert, well developed, in no acute distress. ?HEENT: Conjunctivae and lids unremarkable. ?Cardiovascular: Regular rhythm.  ?Lungs: Normal work of breathing. ?Neurologic: No focal deficits.  ? ?Lab Results  ?Component Value Date  ? CREATININE 0.83 01/05/2020  ? BUN 12 01/05/2020  ? NA 141 01/05/2020  ? K 4.6 01/05/2020  ? CL 102 01/05/2020  ? CO2 24 01/05/2020  ? ?Lab Results  ?Component Value Date  ? ALT 22 01/05/2020  ? AST 24 01/05/2020  ? ALKPHOS 95 01/05/2020  ? BILITOT 0.2 01/05/2020  ? ?Lab Results  ?Component Value Date  ? HGBA1C 6.0 (H) 11/18/2017  ? HGBA1C 6.1 (H) 09/10/2017  ? HGBA1C 5.8 (H) 08/13/2016  ? ?Lab Results  ?Component Value Date  ? INSULIN 16.8 01/05/2020  ? INSULIN 27.5 (H) 09/10/2017  ? ?Lab Results  ?Component Value Date  ? TSH 1.080 01/05/2020  ? ?Lab  Results  ?Component Value Date  ? CHOL 196 01/05/2020  ? HDL 67 01/05/2020  ? LDLCALC 109 (H) 01/05/2020  ? TRIG 113 01/05/2020  ? ?Lab Results  ?Component Value Date  ? VD25OH 43.8 01/05/2020  ? VD25OH 46.3 09/10/2017  ? ?Lab Results  ?Component Value Date  ? WBC 8.6 11/18/2017  ? HGB 13.9 11/18/2017  ? HCT 43.8 11/18/2017  ? MCV 93.8 11/18/2017  ? PLT 361 11/18/2017  ? ?No results found for: IRON, TIBC,  FERRITIN ? ?Attestation Statements:  ? ?Reviewed by clinician on day of visit: allergies, medications, problem list, medical history, surgical history, family history, social history, and previous encounter notes. ? ?I, Lennette Bihari, CMA, am acting as transcriptionist for Dr. Jearld Lesch, DO.  ? ?I have reviewed the above documentation for accuracy and completeness, and I agree with the above. Jearld Lesch, DO ? ?

## 2021-05-15 ENCOUNTER — Encounter (INDEPENDENT_AMBULATORY_CARE_PROVIDER_SITE_OTHER): Payer: Self-pay | Admitting: Bariatrics

## 2021-05-17 ENCOUNTER — Encounter: Payer: Self-pay | Admitting: Obstetrics & Gynecology

## 2021-05-17 ENCOUNTER — Other Ambulatory Visit (HOSPITAL_COMMUNITY)
Admission: RE | Admit: 2021-05-17 | Discharge: 2021-05-17 | Disposition: A | Discharge status: Home / Self Care | Payer: 59 | Source: Ambulatory Visit | Attending: Obstetrics & Gynecology | Admitting: Obstetrics & Gynecology

## 2021-05-17 ENCOUNTER — Ambulatory Visit: Payer: 59 | Admitting: Obstetrics & Gynecology

## 2021-05-17 VITALS — BP 130/73 | HR 90 | Ht 64.0 in | Wt 243.4 lb

## 2021-05-17 DIAGNOSIS — N950 Postmenopausal bleeding: Secondary | ICD-10-CM

## 2021-05-17 DIAGNOSIS — Z6841 Body Mass Index (BMI) 40.0 and over, adult: Secondary | ICD-10-CM | POA: Diagnosis not present

## 2021-05-17 DIAGNOSIS — R9389 Abnormal findings on diagnostic imaging of other specified body structures: Secondary | ICD-10-CM | POA: Diagnosis not present

## 2021-05-17 NOTE — Progress Notes (Signed)
? ?GYN VISIT ?Patient name: Crystal Roberts MRN [id number]  Date of birth: 16-Oct-1955 ?Chief Complaint:   ?Endometrial biopsy ? ?History of Present Illness:   ?Crystal Roberts is a 66 y.o. G2P0020 PM female being seen today for PMB ? ?She notes PMB first week of April- only when she wiped, BRB that lasted for a few days.  Did not require a pad.  No bleeding since then. Notes some pelvic discomfort that she notes as related to gas pain.  Typically BMs 2-3x per week, which has always been normal for her.  Reports no acute gyn concerns ? ?[place] completed 04/25/21: 6.4cm uterus with 58m endometrial lining.  Normal ovaries bilaterally ? ?No LMP recorded. Patient is postmenopausal. ? ? ?  09/03/2019  ?  9:54 AM 09/10/2017  ?  7:48 AM 07/11/2017  ?  8:47 AM  ?Depression screen PHQ 2/9  ?Decreased Interest 1 2 0  ?Down, Depressed, Hopeless 1 1 0  ?PHQ - 2 Score 2 3 0  ?Altered sleeping 0 1   ?Tired, decreased energy 1 2   ?Change in appetite 1 1   ?Feeling bad or failure about yourself  0 1   ?Trouble concentrating 0 1   ?Moving slowly or fidgety/restless 0 0   ?Suicidal thoughts 0 0   ?PHQ-9 Score 4 9   ?Difficult doing work/chores Not difficult at all Not difficult at all   ? ? ? ?Review of Systems:   ?Pertinent items are noted in HPI ?Denies fever/chills, dizziness, headaches, visual disturbances, fatigue, shortness of breath, chest pain, abdominal pain, vomiting, denies problems with bowel movements, urination, or intercourse unless otherwise stated above.  ?Pertinent History Reviewed:  ?Reviewed past medical,surgical, social, obstetrical and family history.  ?Reviewed problem list, medications and allergies. ?Physical Assessment:  ? ?Vitals:  ? 05/17/21 1504  ?BP: 130/73  ?Pulse: 90  ?Weight: 243 lb 6.4 oz (110.4 kg)  ?Height: '5\' 4"'$  (1.626 m)  ?Body mass index is 41.78 kg/m?. ? ?     Physical Examination:  ? General appearance: alert, well appearing, and in no distress ? Psych: mood appropriate, normal affect ? Skin: warm & dry   ? Cardiovascular: normal heart rate noted ? Respiratory: normal respiratory effort, no distress ? Abdomen: obese, soft and non-tender ? Pelvic: normal external genitalia, vulva, vagina, cervix, uterus and adnexa ? Extremities: no edema  ? ?Chaperone: AGlenard HaringNeas   ? ?Endometrial Biopsy Procedure Note ? ?Pre-operative Diagnosis: PMB, borderline thickened endometrium ? ?Post-operative Diagnosis: same ? ?Procedure Details  ?The risks (including infection, bleeding, pain, and uterine perforation) and benefits of the procedure were explained to the patient and Written informed consent was obtained.  Antibiotic prophylaxis against endocarditis was not indicated. ? ? The patient was placed in the dorsal lithotomy position.  Bimanual exam showed the uterus to be in the neutral position.  A speculum inserted in the vagina, and the cervix prepped with betadine.   ? ? A single tooth tenaculum was applied to the anterior lip of the cervix for stabilization.   A Pipelle endometrial aspirator was used to sample the endometrium.  Difficulty obtaining sample due to patient discomfort.  Sample was sent for pathologic examination. ? ?Condition: ?Stable ? ?Complications: ?None ? ?.  ? ?Assessment & Plan:  ?1) PMB ?-reviewed [facility] ?-Next step pending results of pathology.  Discussed potential for inadequate sample.  If no further bleeding, will plan to monitor symptoms at this time. ?The patient was advised to call for  any fever or for prolonged or severe pain or bleeding. She was advised to use OTC analgesics as needed for mild to moderate pain ? ? ?Return in about 1 year (around 05/18/2022) for Annual. ? ? ?Janyth Pupa, DO ?Attending [place], Faculty Practice ?[facility] [place] ? ? ? ?

## 2021-05-17 NOTE — Addendum Note (Signed)
Addended by: Dorita Sciara, Carmesha Morocco A on: 05/17/2021 04:34 PM ? ? Modules accepted: Orders ? ?

## 2021-05-21 ENCOUNTER — Telehealth: Payer: Self-pay | Admitting: Obstetrics & Gynecology

## 2021-05-21 LAB — SURGICAL PATHOLOGY

## 2021-05-21 NOTE — Telephone Encounter (Signed)
Patient was inquiring about her biopsy results. They haven't been release yet that I see. I advised the patient of this. She would like to talk to you at your earliest convenience and she also would like a prescription sent in for constipation to the [facility] in [place]. Please advise.  ?

## 2021-05-23 ENCOUNTER — Telehealth: Payer: Self-pay | Admitting: Obstetrics & Gynecology

## 2021-05-23 NOTE — Telephone Encounter (Signed)
-----   Message from Jari Pigg sent at 05/22/2021  2:39 PM EDT ----- ?Regarding: biospy report ?This pt would like for you to call her about her biospy results. I explained that she may see  the results 48-72 hours before we do and youll call/ message her when you are able to see them  ? ?

## 2021-05-23 NOTE — Telephone Encounter (Signed)
Left message to call back.  Lab results were normal, no further work up indicated ?

## 2021-06-05 ENCOUNTER — Other Ambulatory Visit (HOSPITAL_COMMUNITY): Payer: Self-pay

## 2021-06-05 DIAGNOSIS — M5416 Radiculopathy, lumbar region: Secondary | ICD-10-CM | POA: Diagnosis not present

## 2021-06-05 MED ORDER — METFORMIN HCL 500 MG PO TABS
250.0000 mg | ORAL_TABLET | Freq: Two times a day (BID) | ORAL | 0 refills | Status: DC
Start: 2021-06-05 — End: 2021-10-08
  Filled 2021-06-05: qty 90, 90d supply, fill #0

## 2021-06-05 MED ORDER — BUPROPION HCL ER (XL) 300 MG PO TB24
300.0000 mg | ORAL_TABLET | Freq: Every day | ORAL | 0 refills | Status: DC
  Filled 2021-06-05: qty 90, 90d supply, fill #0

## 2021-06-06 ENCOUNTER — Other Ambulatory Visit (HOSPITAL_COMMUNITY): Payer: Self-pay

## 2021-06-06 ENCOUNTER — Ambulatory Visit (INDEPENDENT_AMBULATORY_CARE_PROVIDER_SITE_OTHER): Payer: 59 | Admitting: Bariatrics

## 2021-06-06 ENCOUNTER — Encounter (INDEPENDENT_AMBULATORY_CARE_PROVIDER_SITE_OTHER): Payer: Self-pay | Admitting: Bariatrics

## 2021-06-06 VITALS — BP 139/72 | HR 69 | Temp 98.7°F | Ht 64.0 in | Wt 240.0 lb

## 2021-06-06 DIAGNOSIS — R0602 Shortness of breath: Secondary | ICD-10-CM

## 2021-06-06 DIAGNOSIS — K5909 Other constipation: Secondary | ICD-10-CM

## 2021-06-06 DIAGNOSIS — E669 Obesity, unspecified: Secondary | ICD-10-CM | POA: Diagnosis not present

## 2021-06-06 DIAGNOSIS — R5383 Other fatigue: Secondary | ICD-10-CM

## 2021-06-06 DIAGNOSIS — Z7985 Long-term (current) use of injectable non-insulin antidiabetic drugs: Secondary | ICD-10-CM | POA: Diagnosis not present

## 2021-06-06 DIAGNOSIS — Z6841 Body Mass Index (BMI) 40.0 and over, adult: Secondary | ICD-10-CM

## 2021-06-06 DIAGNOSIS — E1169 Type 2 diabetes mellitus with other specified complication: Secondary | ICD-10-CM

## 2021-06-06 MED ORDER — TIRZEPATIDE 5 MG/0.5ML ~~LOC~~ SOAJ
5.0000 mg | SUBCUTANEOUS | 0 refills | Status: DC
  Filled 2021-06-06: qty 2, 28d supply, fill #0

## 2021-06-12 NOTE — Progress Notes (Unsigned)
Chief Complaint:   OBESITY Crystal Roberts is here to discuss her progress with her obesity treatment plan along with follow-up of her obesity related diagnoses. Crystal Roberts is on the Category 2 Plan and states she is following her eating plan approximately 40% of the time. [name] states she is walking for 15 minutes 7 times per week.  Today's visit was #: 22 Starting weight: 241 lbs Starting date: 09/10/2017 Today's weight: 240 lbs Today's date: 06/06/2021 Total lbs lost to date: 1 lbs Total lbs lost since last in-office visit: 7 lbs  Interim History: [name] is down 7 lbs since her last visit.   Subjective:   1. Other fatigue/SOB (shortness of breath) [name]'s RMR was lower than previous. IC, RMR today was 1843. Her previous RMR was 2097.  2. Diabetes mellitus type 2 in obese (HCC) Crystal Roberts is currently taking Mounjaro 5 mg and Glucophage 500 mg.  3. Other constipation Crystal Roberts is taking *** secondary to [facility].  Assessment/Plan:   1. Other fatigue/SOB (shortness of breath) [name] will change plan. She will increase exercise. We discussed IC in detail.   2. Diabetes mellitus type 2 in obese [facility]) [name] will continue medications. We will refill Mounjaro 5 mg for 1 month with no refills. Good blood sugar control is important to decrease the likelihood of diabetic complications such as nephropathy, neuropathy, limb loss, blindness, coronary artery disease, and death. Intensive lifestyle modification including diet, exercise and weight loss are the first line of treatment for diabetes.   - tirzepatide [facility]) 5 MG/0.5ML Pen; Inject 5 mg into the skin once a week.  Dispense: 2 mL; Refill: 0  3. Other constipation [name] will *** over the counter 300-500 mg. She will continue taking Milk of Magnesium or Miralax. She was informed that a decrease in bowel movement frequency is normal while losing weight, but stools should not be hard or painful. Orders and follow up as documented in patient record.    Counseling Getting to Good Bowel Health: Your goal is to have one soft bowel movement each day. Drink at least 8 glasses of water each day. Eat plenty of fiber (goal is over 25 grams each day). It is best to get most of your fiber from dietary sources which includes leafy green vegetables, fresh fruit, and whole grains. You may need to add fiber with the help of OTC fiber supplements. These include Metamucil, Citrucel, and Flaxseed. If you are still having trouble, try adding Miralax or Magnesium Citrate. If all of these changes do not work, Cabin crew.   4. Obesity, Current BMI 41.3 Crystal Roberts is currently in the action stage of change. As such, her goal is to continue with weight loss efforts. She has agreed to the Category 2 Plan and keeping a food journal and adhering to recommended goals of 1300 calories and 80-85 grams of protein.   Crystal Roberts will continue meal planning and she will continue intentional eating.   Exercise goals:  As is.   Behavioral modification strategies: increasing lean protein intake, decreasing simple carbohydrates, increasing vegetables, increasing water intake, no skipping meals, meal planning and cooking strategies, keeping healthy foods in the home, ways to avoid boredom eating, and planning for success.  Crystal Roberts has agreed to follow-up with our clinic in 3-4 weeks. She was informed of the importance of frequent follow-up visits to maximize her success with intensive lifestyle modifications for her multiple health conditions.   Objective:   Blood pressure 139/72, pulse 69, temperature 98.7 F (37.1 C),  height '5\' 4"'$  (1.626 m), weight 240 lb (108.9 kg), SpO2 97 %. Body mass index is 41.2 kg/m.  General: Cooperative, alert, well developed, in no acute distress. HEENT: Conjunctivae and lids unremarkable. Cardiovascular: Regular rhythm.  Lungs: Normal work of breathing. Neurologic: No focal deficits.   Lab Results  Component Value Date   CREATININE 0.83  01/05/2020   BUN 12 01/05/2020   NA 141 01/05/2020   K 4.6 01/05/2020   CL 102 01/05/2020   CO2 24 01/05/2020   Lab Results  Component Value Date   ALT 22 01/05/2020   AST 24 01/05/2020   ALKPHOS 95 01/05/2020   BILITOT 0.2 01/05/2020   Lab Results  Component Value Date   HGBA1C 6.0 (H) 11/18/2017   HGBA1C 6.1 (H) 09/10/2017   HGBA1C 5.8 (H) 08/13/2016   Lab Results  Component Value Date   INSULIN 16.8 01/05/2020   INSULIN 27.5 (H) 09/10/2017   Lab Results  Component Value Date   TSH 1.080 01/05/2020   Lab Results  Component Value Date   CHOL 196 01/05/2020   HDL 67 01/05/2020   LDLCALC 109 (H) 01/05/2020   TRIG 113 01/05/2020   Lab Results  Component Value Date   VD25OH 43.8 01/05/2020   VD25OH 46.3 09/10/2017   Lab Results  Component Value Date   WBC 8.6 11/18/2017   HGB 13.9 11/18/2017   HCT 43.8 11/18/2017   MCV 93.8 11/18/2017   PLT 361 11/18/2017   No results found for: IRON, TIBC, FERRITIN  Attestation Statements:   Reviewed by clinician on day of visit: allergies, medications, problem list, medical history, surgical history, family history, social history, and previous encounter notes.  Time spent on visit including pre-visit chart review and post-visit care and charting was 30 minutes.   I, Lizbeth Bark, RMA, am acting as Location manager for [employer], DO.  I have reviewed the above documentation for accuracy and completeness, and I agree with the above. -  ***

## 2021-06-13 ENCOUNTER — Encounter (INDEPENDENT_AMBULATORY_CARE_PROVIDER_SITE_OTHER): Payer: Self-pay | Admitting: Bariatrics

## 2021-06-21 DIAGNOSIS — M5416 Radiculopathy, lumbar region: Secondary | ICD-10-CM | POA: Diagnosis not present

## 2021-07-09 ENCOUNTER — Other Ambulatory Visit (HOSPITAL_COMMUNITY): Payer: Self-pay

## 2021-07-09 ENCOUNTER — Ambulatory Visit (INDEPENDENT_AMBULATORY_CARE_PROVIDER_SITE_OTHER): Payer: 59 | Admitting: Bariatrics

## 2021-07-09 VITALS — BP 115/72 | HR 78 | Temp 98.3°F | Ht 64.0 in | Wt 232.0 lb

## 2021-07-09 DIAGNOSIS — I10 Essential (primary) hypertension: Secondary | ICD-10-CM | POA: Diagnosis not present

## 2021-07-09 DIAGNOSIS — Z7985 Long-term (current) use of injectable non-insulin antidiabetic drugs: Secondary | ICD-10-CM

## 2021-07-09 DIAGNOSIS — E669 Obesity, unspecified: Secondary | ICD-10-CM | POA: Diagnosis not present

## 2021-07-09 DIAGNOSIS — R198 Other specified symptoms and signs involving the digestive system and abdomen: Secondary | ICD-10-CM

## 2021-07-09 DIAGNOSIS — Z7984 Long term (current) use of oral hypoglycemic drugs: Secondary | ICD-10-CM | POA: Diagnosis not present

## 2021-07-09 DIAGNOSIS — E1169 Type 2 diabetes mellitus with other specified complication: Secondary | ICD-10-CM

## 2021-07-09 DIAGNOSIS — Z6839 Body mass index (BMI) 39.0-39.9, adult: Secondary | ICD-10-CM | POA: Diagnosis not present

## 2021-07-09 MED ORDER — LINACLOTIDE 145 MCG PO CAPS
145.0000 ug | ORAL_CAPSULE | Freq: Every day | ORAL | 0 refills | Status: DC
  Filled 2021-07-09: qty 30, 30d supply, fill #0

## 2021-07-09 MED ORDER — TIRZEPATIDE 5 MG/0.5ML ~~LOC~~ SOAJ
5.0000 mg | SUBCUTANEOUS | 0 refills | Status: DC
  Filled 2021-07-09: qty 2, 28d supply, fill #0

## 2021-07-10 NOTE — Progress Notes (Signed)
Chief Complaint:   OBESITY Crystal Roberts is here to discuss her progress with her obesity treatment plan along with follow-up of her obesity related diagnoses. [name] is on the Category 2 Plan and keeping a food journal or adhering to recommended goals of 1300 calories and 80-85 grams of protein daily and states she is following her eating plan approximately 50-60% of the time. [name] states she is walking for 15 minutes 5 times per week.  Today's visit was #: 23 Starting weight: 241 lbs Starting date: 09/10/2017 Today's weight: 232 lbs Today's date: 07/09/2021 Total lbs lost to date: 9 Total lbs lost since last in-office visit: 8  Interim History: Crystal Roberts is down 8 pounds since her last visit.  Subjective:   1. Diabetes mellitus type 2 in obese [facility]) Crystal Roberts is on metformin and Mounjaro, and she denies side effects.  Her fasting blood sugars range at 93.  2. Essential hypertension Crystal Roberts's blood pressure is currently controlled.  3. Irregular bowel habits Crystal Roberts denies a history of suspected GI obstruction.  Assessment/Plan:   1. Diabetes mellitus type 2 in obese [facility]) Thayer will continue her medications as directed, and we will refill Mounjaro 5 mg once weekly for 1 month.  - tirzepatide [facility]) 5 MG/0.5ML Pen; Inject 5 mg into the skin once a week.  Dispense: 2 mL; Refill: 0  2. Essential hypertension Crystal Roberts will continue her medications as directed.  3. Irregular bowel habits Crystal Roberts agreed to start Linzess 145 mcg 1 capsule daily before breakfast, with no refills.  We will follow-up at her next visit.  - linaclotide (LINZESS) 145 MCG CAPS capsule; Take 1 capsule (145 mcg total) by mouth daily before breakfast.  Dispense: 30 capsule; Refill: 0  4. Obesity, Current BMI 39.9 Crystal Roberts is currently in the action stage of change. As such, her goal is to continue with weight loss efforts. She has agreed to the Category 2 Plan or keeping a food journal and adhering to recommended goals of  1300 calories and 80-85 grams of protein daily.   Meal planning and intentional eating were discussed.  Exercise goals: As is.   Behavioral modification strategies: increasing lean protein intake, decreasing simple carbohydrates, increasing vegetables, increasing water intake, decreasing eating out, no skipping meals, meal planning and cooking strategies, keeping healthy foods in the home, and planning for success.  Crystal Roberts has agreed to follow-up with our clinic in 3 weeks. She was informed of the importance of frequent follow-up visits to maximize her success with intensive lifestyle modifications for her multiple health conditions.   Objective:   Blood pressure 115/72, pulse 78, temperature 98.3 F (36.8 C), height '5\' 4"'$  (1.626 m), weight 232 lb (105.2 kg), SpO2 96 %. Body mass index is 39.82 kg/m.  General: Cooperative, alert, well developed, in no acute distress. HEENT: Conjunctivae and lids unremarkable. Cardiovascular: Regular rhythm.  Lungs: Normal work of breathing. Neurologic: No focal deficits.   Lab Results  Component Value Date   CREATININE 0.83 01/05/2020   BUN 12 01/05/2020   NA 141 01/05/2020   K 4.6 01/05/2020   CL 102 01/05/2020   CO2 24 01/05/2020   Lab Results  Component Value Date   ALT 22 01/05/2020   AST 24 01/05/2020   ALKPHOS 95 01/05/2020   BILITOT 0.2 01/05/2020   Lab Results  Component Value Date   HGBA1C 6.0 (H) 11/18/2017   HGBA1C 6.1 (H) 09/10/2017   HGBA1C 5.8 (H) 08/13/2016   Lab Results  Component Value Date  INSULIN 16.8 01/05/2020   INSULIN 27.5 (H) 09/10/2017   Lab Results  Component Value Date   TSH 1.080 01/05/2020   Lab Results  Component Value Date   CHOL 196 01/05/2020   HDL 67 01/05/2020   LDLCALC 109 (H) 01/05/2020   TRIG 113 01/05/2020   Lab Results  Component Value Date   VD25OH 43.8 01/05/2020   VD25OH 46.3 09/10/2017   Lab Results  Component Value Date   WBC 8.6 11/18/2017   HGB 13.9 11/18/2017   HCT  43.8 11/18/2017   MCV 93.8 11/18/2017   PLT 361 11/18/2017   No results found for: "IRON", "TIBC", "FERRITIN"  Attestation Statements:   Reviewed by clinician on day of visit: allergies, medications, problem list, medical history, surgical history, family history, social history, and previous encounter notes.   Wilhemena Durie, am acting as Location manager for [employer], DO.  I have reviewed the above documentation for accuracy and completeness, and I agree with the above. Jearld Lesch, DO

## 2021-07-11 ENCOUNTER — Encounter (INDEPENDENT_AMBULATORY_CARE_PROVIDER_SITE_OTHER): Payer: Self-pay | Admitting: Bariatrics

## 2021-08-01 ENCOUNTER — Other Ambulatory Visit (HOSPITAL_COMMUNITY): Payer: Self-pay

## 2021-08-01 ENCOUNTER — Other Ambulatory Visit (INDEPENDENT_AMBULATORY_CARE_PROVIDER_SITE_OTHER): Payer: Self-pay | Admitting: Bariatrics

## 2021-08-01 DIAGNOSIS — M5416 Radiculopathy, lumbar region: Secondary | ICD-10-CM | POA: Diagnosis not present

## 2021-08-01 DIAGNOSIS — E1169 Type 2 diabetes mellitus with other specified complication: Secondary | ICD-10-CM

## 2021-08-13 ENCOUNTER — Encounter (INDEPENDENT_AMBULATORY_CARE_PROVIDER_SITE_OTHER): Payer: Self-pay | Admitting: Bariatrics

## 2021-08-13 ENCOUNTER — Other Ambulatory Visit (HOSPITAL_COMMUNITY): Payer: Self-pay

## 2021-08-13 ENCOUNTER — Ambulatory Visit (INDEPENDENT_AMBULATORY_CARE_PROVIDER_SITE_OTHER): Payer: 59 | Admitting: Bariatrics

## 2021-08-13 VITALS — BP 146/80 | HR 72 | Temp 97.8°F | Ht 64.0 in | Wt 229.0 lb

## 2021-08-13 DIAGNOSIS — E669 Obesity, unspecified: Secondary | ICD-10-CM | POA: Diagnosis not present

## 2021-08-13 DIAGNOSIS — Z6839 Body mass index (BMI) 39.0-39.9, adult: Secondary | ICD-10-CM

## 2021-08-13 DIAGNOSIS — E1169 Type 2 diabetes mellitus with other specified complication: Secondary | ICD-10-CM | POA: Diagnosis not present

## 2021-08-13 DIAGNOSIS — Z7985 Long-term (current) use of injectable non-insulin antidiabetic drugs: Secondary | ICD-10-CM | POA: Diagnosis not present

## 2021-08-13 DIAGNOSIS — I10 Essential (primary) hypertension: Secondary | ICD-10-CM

## 2021-08-13 MED ORDER — TIRZEPATIDE 7.5 MG/0.5ML ~~LOC~~ SOAJ
7.5000 mg | SUBCUTANEOUS | 0 refills | Status: DC
  Filled 2021-08-13: qty 2, 28d supply, fill #0

## 2021-08-14 ENCOUNTER — Other Ambulatory Visit (HOSPITAL_COMMUNITY): Payer: Self-pay

## 2021-08-14 ENCOUNTER — Other Ambulatory Visit (INDEPENDENT_AMBULATORY_CARE_PROVIDER_SITE_OTHER): Payer: Self-pay | Admitting: Bariatrics

## 2021-08-14 DIAGNOSIS — R198 Other specified symptoms and signs involving the digestive system and abdomen: Secondary | ICD-10-CM

## 2021-08-15 ENCOUNTER — Other Ambulatory Visit (HOSPITAL_COMMUNITY): Payer: Self-pay

## 2021-08-21 NOTE — Progress Notes (Unsigned)
Chief Complaint:   OBESITY Crystal Roberts is here to discuss her progress with her obesity treatment plan along with follow-up of her obesity related diagnoses. Crystal Roberts is on the Category 2 Plan and keeping a food journal and adhering to recommended goals of 1300 calories and 80-85 grams of protein daily and states she is following her eating plan approximately 90% of the time. Crystal Roberts states she is walking for 30 minutes 3 times per week.  Today's visit was #: 24 Starting weight: 241 lbs Starting date: 09/10/2017 Today's weight: 229 lbs Today's date: 08/13/2021 Total lbs lost to date: 12 Total lbs lost since last in-office visit: 3  Interim History: Crystal Roberts is down another 3 lbs since her last visit. She is doing ok with her water intake.   Subjective:   1. Diabetes mellitus type 2 in obese [facility]) Crystal Roberts is taking Mounjaro as directed with no side effects. She notes benefits with appetite.   2. Essential hypertension Crystal Roberts's blood pressure is currently controlled.   Assessment/Plan:   1. Diabetes mellitus type 2 in obese [facility]) Crystal Roberts agreed to increase Mounjaro form 5 mg to 7.5 mg once weekly, and we will refill for 1 month.  - tirzepatide (MOUNJARO) 7.5 MG/0.5ML Pen; Inject 7.5 mg into the skin once a week.  Dispense: 2 mL; Refill: 0  2. Essential hypertension Crystal Roberts will continue her medications, and we will follow-up on her blood pressure at her next visit.  3. Obesity, Current BMI 39.3 Crystal Roberts is currently in the action stage of change. As such, her goal is to continue with weight loss efforts. She has agreed to the Category 2 Plan.   Meal planning and intentional eating were discussed.   Exercise goals: As is. Joined a gym with 24/7 hours.   Behavioral modification strategies: increasing lean protein intake, decreasing simple carbohydrates, increasing vegetables, increasing water intake, decreasing eating out, no skipping meals, meal planning and cooking strategies, keeping healthy  foods in the home, and planning for success.  Crystal Roberts has agreed to follow-up with our clinic in 3 weeks. She was informed of the importance of frequent follow-up visits to maximize her success with intensive lifestyle modifications for her multiple health conditions.   Objective:   Blood pressure (!) 146/80, pulse 72, temperature 97.8 F (36.6 C), height '5\' 4"'$  (1.626 m), weight 229 lb (103.9 kg), SpO2 95 %. Body mass index is 39.31 kg/m.  General: Cooperative, alert, well developed, in no acute distress. HEENT: Conjunctivae and lids unremarkable. Cardiovascular: Regular rhythm.  Lungs: Normal work of breathing. Neurologic: No focal deficits.   Lab Results  Component Value Date   CREATININE 0.83 01/05/2020   BUN 12 01/05/2020   NA 141 01/05/2020   K 4.6 01/05/2020   CL 102 01/05/2020   CO2 24 01/05/2020   Lab Results  Component Value Date   ALT 22 01/05/2020   AST 24 01/05/2020   ALKPHOS 95 01/05/2020   BILITOT 0.2 01/05/2020   Lab Results  Component Value Date   HGBA1C 6.0 (H) 11/18/2017   HGBA1C 6.1 (H) 09/10/2017   HGBA1C 5.8 (H) 08/13/2016   Lab Results  Component Value Date   INSULIN 16.8 01/05/2020   INSULIN 27.5 (H) 09/10/2017   Lab Results  Component Value Date   TSH 1.080 01/05/2020   Lab Results  Component Value Date   CHOL 196 01/05/2020   HDL 67 01/05/2020   LDLCALC 109 (H) 01/05/2020   TRIG 113 01/05/2020   Lab Results  Component  Value Date   VD25OH 43.8 01/05/2020   VD25OH 46.3 09/10/2017   Lab Results  Component Value Date   WBC 8.6 11/18/2017   HGB 13.9 11/18/2017   HCT 43.8 11/18/2017   MCV 93.8 11/18/2017   PLT 361 11/18/2017   No results found for: "IRON", "TIBC", "FERRITIN"  Attestation Statements:   Reviewed by clinician on day of visit: allergies, medications, problem list, medical history, surgical history, family history, social history, and previous encounter notes.   Wilhemena Durie, am acting as Location manager for  [employer], DO.  I have reviewed the above documentation for accuracy and completeness, and I agree with the above. Jearld Lesch, DO

## 2021-08-22 ENCOUNTER — Encounter (INDEPENDENT_AMBULATORY_CARE_PROVIDER_SITE_OTHER): Payer: Self-pay

## 2021-08-29 ENCOUNTER — Other Ambulatory Visit (HOSPITAL_COMMUNITY): Payer: Self-pay

## 2021-08-30 DIAGNOSIS — I10 Essential (primary) hypertension: Secondary | ICD-10-CM | POA: Diagnosis not present

## 2021-08-30 DIAGNOSIS — E118 Type 2 diabetes mellitus with unspecified complications: Secondary | ICD-10-CM | POA: Diagnosis not present

## 2021-08-30 DIAGNOSIS — E782 Mixed hyperlipidemia: Secondary | ICD-10-CM | POA: Diagnosis not present

## 2021-08-30 DIAGNOSIS — Z6837 Body mass index (BMI) 37.0-37.9, adult: Secondary | ICD-10-CM | POA: Diagnosis not present

## 2021-08-30 DIAGNOSIS — E7849 Other hyperlipidemia: Secondary | ICD-10-CM | POA: Diagnosis not present

## 2021-08-30 DIAGNOSIS — E039 Hypothyroidism, unspecified: Secondary | ICD-10-CM | POA: Diagnosis not present

## 2021-08-30 DIAGNOSIS — Z0000 Encounter for general adult medical examination without abnormal findings: Secondary | ICD-10-CM | POA: Diagnosis not present

## 2021-08-30 DIAGNOSIS — M1991 Primary osteoarthritis, unspecified site: Secondary | ICD-10-CM | POA: Diagnosis not present

## 2021-09-13 ENCOUNTER — Encounter (INDEPENDENT_AMBULATORY_CARE_PROVIDER_SITE_OTHER): Payer: Self-pay | Admitting: Bariatrics

## 2021-09-13 ENCOUNTER — Ambulatory Visit (INDEPENDENT_AMBULATORY_CARE_PROVIDER_SITE_OTHER): Payer: 59 | Admitting: Bariatrics

## 2021-09-13 ENCOUNTER — Other Ambulatory Visit (HOSPITAL_COMMUNITY): Payer: Self-pay

## 2021-09-13 VITALS — BP 110/63 | HR 74 | Temp 98.3°F | Ht 64.0 in | Wt 220.0 lb

## 2021-09-13 DIAGNOSIS — Z7985 Long-term (current) use of injectable non-insulin antidiabetic drugs: Secondary | ICD-10-CM | POA: Diagnosis not present

## 2021-09-13 DIAGNOSIS — Z6837 Body mass index (BMI) 37.0-37.9, adult: Secondary | ICD-10-CM

## 2021-09-13 DIAGNOSIS — R198 Other specified symptoms and signs involving the digestive system and abdomen: Secondary | ICD-10-CM | POA: Insufficient documentation

## 2021-09-13 DIAGNOSIS — E1169 Type 2 diabetes mellitus with other specified complication: Secondary | ICD-10-CM | POA: Diagnosis not present

## 2021-09-13 DIAGNOSIS — E669 Obesity, unspecified: Secondary | ICD-10-CM | POA: Diagnosis not present

## 2021-09-13 MED ORDER — TIRZEPATIDE 7.5 MG/0.5ML ~~LOC~~ SOAJ
7.5000 mg | SUBCUTANEOUS | 0 refills | Status: DC
  Filled 2021-09-13: qty 2, 28d supply, fill #0

## 2021-09-13 MED ORDER — LINACLOTIDE 145 MCG PO CAPS
145.0000 ug | ORAL_CAPSULE | Freq: Every day | ORAL | 0 refills | Status: DC
  Filled 2021-09-13: qty 30, 30d supply, fill #0

## 2021-09-25 NOTE — Progress Notes (Unsigned)
Chief Complaint:   OBESITY Crystal Roberts is here to discuss her progress with her obesity treatment plan along with follow-up of her obesity related diagnoses. Crystal Roberts is on the Category 2 Plan and states she is following her eating plan approximately 80-85% of the time. Crystal Roberts states she is at the gym for 240 minutes 2 times per week.  Today's visit was #: 25 Starting weight: 241 lbs Starting date: 09/10/2017 Today's weight: 220 lbs Today's date: 09/13/2021 Total lbs lost to date: 21 Total lbs lost since last in-office visit: 9  Interim History: Crystal Roberts is down 9 lbs since her last visit and she is doing well overall.   Subjective:   1. Irregular bowel habits Crystal Roberts notes constipation. She has tried OTC medications, and increased water and fiber without relief. She notes Linzess is working for her.   2. Diabetes mellitus type 2 in obese ([facility]) Crystal Roberts is taking Mounjaro. She has increased her protein, and her appetite is normal.   Assessment/Plan:   1. Irregular bowel habits Crystal Roberts will continue Linzess 145 mcg daily, and we will refill for 1 month.  - linaclotide (LINZESS) 145 MCG CAPS capsule; Take 1 capsule (145 mcg total) by mouth daily before breakfast.  Dispense: 30 capsule; Refill: 0  2. Diabetes mellitus type 2 in obese (Crystal Roberts) Crystal Roberts will continue Mounjaro 7.5 mg once weekly, and we will refill for 1 month.  - tirzepatide (MOUNJARO) 7.5 MG/0.5ML Pen; Inject 7.5 mg into the skin once a week.  Dispense: 2 mL; Refill: 0  3. Obesity, Current BMI 37.8 Crystal Roberts is currently in the action stage of change. As such, her goal is to continue with weight loss efforts. She has agreed to the Category 2 Plan.   Meal planning and intentional eating were discussed. She will adhere closely to the meal plan.   Exercise goals: As is.   Behavioral modification strategies: increasing lean protein intake, decreasing simple carbohydrates, increasing vegetables, increasing water intake, decreasing eating  out, no skipping meals, meal planning and cooking strategies, keeping healthy foods in the home, and planning for success.  Crystal Roberts has agreed to follow-up with our clinic in 4 weeks. She was informed of the importance of frequent follow-up visits to maximize her success with intensive lifestyle modifications for her multiple health conditions.   Objective:   Blood pressure 110/63, pulse 74, temperature 98.3 F (36.8 C), height '5\' 4"'$  (1.626 m), weight 220 lb (99.8 kg), SpO2 97 %. Body mass index is 37.76 kg/m.  General: Cooperative, alert, well developed, in no acute distress. HEENT: Conjunctivae and lids unremarkable. Cardiovascular: Regular rhythm.  Lungs: Normal work of breathing. Neurologic: No focal deficits.   Lab Results  Component Value Date   CREATININE 0.83 01/05/2020   BUN 12 01/05/2020   NA 141 01/05/2020   K 4.6 01/05/2020   CL 102 01/05/2020   CO2 24 01/05/2020   Lab Results  Component Value Date   ALT 22 01/05/2020   AST 24 01/05/2020   ALKPHOS 95 01/05/2020   BILITOT 0.2 01/05/2020   Lab Results  Component Value Date   HGBA1C 6.0 (H) 11/18/2017   HGBA1C 6.1 (H) 09/10/2017   HGBA1C 5.8 (H) 08/13/2016   Lab Results  Component Value Date   INSULIN 16.8 01/05/2020   INSULIN 27.5 (H) 09/10/2017   Lab Results  Component Value Date   TSH 1.080 01/05/2020   Lab Results  Component Value Date   CHOL 196 01/05/2020   HDL 67 01/05/2020  LDLCALC 109 (H) 01/05/2020   TRIG 113 01/05/2020   Lab Results  Component Value Date   VD25OH 43.8 01/05/2020   VD25OH 46.3 09/10/2017   Lab Results  Component Value Date   WBC 8.6 11/18/2017   HGB 13.9 11/18/2017   HCT 43.8 11/18/2017   MCV 93.8 11/18/2017   PLT 361 11/18/2017   No results found for: "IRON", "TIBC", "FERRITIN"  Attestation Statements:   Reviewed by clinician on day of visit: allergies, medications, problem list, medical history, surgical history, family history, social history, and previous  encounter notes.   Wilhemena Durie, am acting as Location manager for [employer], DO.  I have reviewed the above documentation for accuracy and completeness, and I agree with the above. Jearld Lesch, DO

## 2021-09-26 ENCOUNTER — Encounter (INDEPENDENT_AMBULATORY_CARE_PROVIDER_SITE_OTHER): Payer: Self-pay | Admitting: Bariatrics

## 2021-10-08 ENCOUNTER — Other Ambulatory Visit (HOSPITAL_COMMUNITY): Payer: Self-pay

## 2021-10-08 MED ORDER — METFORMIN HCL 500 MG PO TABS
250.0000 mg | ORAL_TABLET | Freq: Two times a day (BID) | ORAL | 0 refills | Status: DC
  Filled 2021-10-08: qty 90, 90d supply, fill #0

## 2021-10-16 ENCOUNTER — Ambulatory Visit: Payer: 59 | Admitting: Bariatrics

## 2021-10-16 ENCOUNTER — Encounter: Payer: Self-pay | Admitting: Bariatrics

## 2021-10-16 ENCOUNTER — Other Ambulatory Visit (HOSPITAL_COMMUNITY): Payer: Self-pay

## 2021-10-16 VITALS — BP 110/63 | Ht 64.0 in | Wt 208.0 lb

## 2021-10-16 DIAGNOSIS — E65 Localized adiposity: Secondary | ICD-10-CM | POA: Diagnosis not present

## 2021-10-16 DIAGNOSIS — Z7985 Long-term (current) use of injectable non-insulin antidiabetic drugs: Secondary | ICD-10-CM | POA: Diagnosis not present

## 2021-10-16 DIAGNOSIS — Z6835 Body mass index (BMI) 35.0-35.9, adult: Secondary | ICD-10-CM | POA: Diagnosis not present

## 2021-10-16 DIAGNOSIS — E1169 Type 2 diabetes mellitus with other specified complication: Secondary | ICD-10-CM | POA: Diagnosis not present

## 2021-10-16 DIAGNOSIS — E785 Hyperlipidemia, unspecified: Secondary | ICD-10-CM

## 2021-10-16 DIAGNOSIS — E669 Obesity, unspecified: Secondary | ICD-10-CM

## 2021-10-16 MED ORDER — TIRZEPATIDE 10 MG/0.5ML ~~LOC~~ SOAJ
10.0000 mg | SUBCUTANEOUS | 0 refills | Status: DC
  Filled 2021-10-16: qty 2, 28d supply, fill #0

## 2021-10-17 ENCOUNTER — Other Ambulatory Visit (HOSPITAL_COMMUNITY): Payer: Self-pay

## 2021-10-17 ENCOUNTER — Other Ambulatory Visit (HOSPITAL_COMMUNITY): Payer: Self-pay | Admitting: Family Medicine

## 2021-10-17 ENCOUNTER — Encounter: Payer: Self-pay | Admitting: Bariatrics

## 2021-10-17 ENCOUNTER — Other Ambulatory Visit (INDEPENDENT_AMBULATORY_CARE_PROVIDER_SITE_OTHER): Payer: Self-pay | Admitting: Bariatrics

## 2021-10-17 DIAGNOSIS — Z1231 Encounter for screening mammogram for malignant neoplasm of breast: Secondary | ICD-10-CM

## 2021-10-17 MED ORDER — TIRZEPATIDE 7.5 MG/0.5ML ~~LOC~~ SOAJ
7.5000 mg | SUBCUTANEOUS | 0 refills | Status: DC
  Filled 2021-10-17 (×2): qty 2, 28d supply, fill #0

## 2021-10-17 NOTE — Telephone Encounter (Signed)
Can you please advise? Patient was seen yesterday

## 2021-10-22 ENCOUNTER — Encounter: Payer: Self-pay | Admitting: Bariatrics

## 2021-10-23 ENCOUNTER — Encounter: Payer: Self-pay | Admitting: Bariatrics

## 2021-10-23 NOTE — Progress Notes (Signed)
Chief Complaint:   OBESITY Crystal Roberts is here to discuss her progress with her obesity treatment plan along with follow-up of her obesity related diagnoses. Crystal Roberts is on the Category 2 Plan and states she is following her eating plan approximately 90% of the time. Crystal Roberts states she is at the gym for 60 minutes 2 times per week.  Today's visit was #: 27 Starting weight: 241 lbs Starting date: 09/10/2017 Today's weight: 208 lbs Today's date: 10/16/2021 Total lbs lost to date: 33 Total lbs lost since last in-office visit: 12  Interim History: Crystal Roberts is down an additional 12 pounds, and she is doing well overall.  She is feeling better.  Subjective:   1. Diabetes mellitus type 2 in obese ([place]) Crystal Roberts is taking Mounjaro 7.5 mg, and she notes her appetite is up slightly.  2. Hyperlipidemia associated with type 2 diabetes mellitus (Crystal Roberts) Crystal Roberts is currently taking Crestor.  3. Abdominal pannus Crystal Roberts notes her symptoms are worse with weight loss.  She is subjected to irritation, yeast, and bacterial infection.  Assessment/Plan:   1. Diabetes mellitus type 2 in obese [facility]) Crystal Roberts agreed to increase Mounjaro from 7.5 mg to 10 mg once weekly, and we will refill for 1 month.  2. Hyperlipidemia associated with type 2 diabetes mellitus (Crystal Roberts) Crystal Roberts will continue Crestor.  She will work on eliminating trans fats and limit saturated fat.  3. Abdominal pannus Crystal Roberts will consider abdominal surgery in the future.  4. Obesity, Current BMI 35.8 Crystal Roberts is currently in the action stage of change. As such, her goal is to continue with weight loss efforts. She has agreed to the Category 2 Plan.   She will adhere closely to the plan.  Mindful eating and healthy snacks were discussed.  She will increase her fiber intake.  We will recheck fasting labs at her next visit.  Exercise goals: As is.   Behavioral modification strategies: increasing lean protein intake, decreasing simple carbohydrates, increasing  vegetables, increasing water intake, decreasing eating out, no skipping meals, meal planning and cooking strategies, keeping healthy foods in the home, and planning for success.  Crystal Roberts has agreed to follow-up with our clinic in 4 weeks. She was informed of the importance of frequent follow-up visits to maximize her success with intensive lifestyle modifications for her multiple health conditions.   Objective:   There were no vitals taken for this visit. There is no height or weight on file to calculate BMI.  General: Cooperative, alert, well developed, in no acute distress. HEENT: Conjunctivae and lids unremarkable. Cardiovascular: Regular rhythm.  Lungs: Normal work of breathing. Neurologic: No focal deficits.   Lab Results  Component Value Date   CREATININE 0.83 01/05/2020   BUN 12 01/05/2020   NA 141 01/05/2020   K 4.6 01/05/2020   CL 102 01/05/2020   CO2 24 01/05/2020   Lab Results  Component Value Date   ALT 22 01/05/2020   AST 24 01/05/2020   ALKPHOS 95 01/05/2020   BILITOT 0.2 01/05/2020   Lab Results  Component Value Date   HGBA1C 6.0 (H) 11/18/2017   HGBA1C 6.1 (H) 09/10/2017   HGBA1C 5.8 (H) 08/13/2016   Lab Results  Component Value Date   INSULIN 16.8 01/05/2020   INSULIN 27.5 (H) 09/10/2017   Lab Results  Component Value Date   TSH 1.080 01/05/2020   Lab Results  Component Value Date   CHOL 196 01/05/2020   HDL 67 01/05/2020   LDLCALC 109 (H) 01/05/2020  TRIG 113 01/05/2020   Lab Results  Component Value Date   VD25OH 43.8 01/05/2020   VD25OH 46.3 09/10/2017   Lab Results  Component Value Date   WBC 8.6 11/18/2017   HGB 13.9 11/18/2017   HCT 43.8 11/18/2017   MCV 93.8 11/18/2017   PLT 361 11/18/2017   No results found for: "IRON", "TIBC", "FERRITIN"  Attestation Statements:   Reviewed by clinician on day of visit: allergies, medications, problem list, medical history, surgical history, family history, social history, and previous  encounter notes.   Wilhemena Durie, am acting as Location manager for [employer], DO.  I have reviewed the above documentation for accuracy and completeness, and I agree with the above. Jearld Lesch, DO

## 2021-10-24 DIAGNOSIS — H43813 Vitreous degeneration, bilateral: Secondary | ICD-10-CM | POA: Diagnosis not present

## 2021-10-24 DIAGNOSIS — D3132 Benign neoplasm of left choroid: Secondary | ICD-10-CM | POA: Diagnosis not present

## 2021-10-24 DIAGNOSIS — E119 Type 2 diabetes mellitus without complications: Secondary | ICD-10-CM | POA: Diagnosis not present

## 2021-10-24 DIAGNOSIS — H25813 Combined forms of age-related cataract, bilateral: Secondary | ICD-10-CM | POA: Diagnosis not present

## 2021-11-07 ENCOUNTER — Other Ambulatory Visit (HOSPITAL_COMMUNITY): Payer: Self-pay

## 2021-11-07 ENCOUNTER — Encounter: Payer: Self-pay | Admitting: Bariatrics

## 2021-11-07 ENCOUNTER — Ambulatory Visit: Payer: 59 | Admitting: Bariatrics

## 2021-11-07 VITALS — BP 160/84 | HR 67 | Temp 98.0°F | Ht 64.0 in | Wt 212.0 lb

## 2021-11-07 DIAGNOSIS — E1169 Type 2 diabetes mellitus with other specified complication: Secondary | ICD-10-CM

## 2021-11-07 DIAGNOSIS — E669 Obesity, unspecified: Secondary | ICD-10-CM

## 2021-11-07 DIAGNOSIS — R198 Other specified symptoms and signs involving the digestive system and abdomen: Secondary | ICD-10-CM | POA: Diagnosis not present

## 2021-11-07 DIAGNOSIS — Z6836 Body mass index (BMI) 36.0-36.9, adult: Secondary | ICD-10-CM

## 2021-11-07 DIAGNOSIS — Z7985 Long-term (current) use of injectable non-insulin antidiabetic drugs: Secondary | ICD-10-CM

## 2021-11-07 DIAGNOSIS — Z6841 Body Mass Index (BMI) 40.0 and over, adult: Secondary | ICD-10-CM

## 2021-11-07 MED ORDER — LINACLOTIDE 145 MCG PO CAPS
145.0000 ug | ORAL_CAPSULE | Freq: Every day | ORAL | 1 refills | Status: DC
  Filled 2021-11-07: qty 30, 30d supply, fill #0
  Filled 2022-01-23: qty 30, 30d supply, fill #1

## 2021-11-07 MED ORDER — TIRZEPATIDE 10 MG/0.5ML ~~LOC~~ SOAJ
10.0000 mg | SUBCUTANEOUS | 0 refills | Status: DC
  Filled 2021-11-07: qty 2, 28d supply, fill #0

## 2021-11-08 ENCOUNTER — Other Ambulatory Visit (HOSPITAL_COMMUNITY): Payer: Self-pay

## 2021-11-08 MED ORDER — AMLODIPINE BESYLATE 10 MG PO TABS
10.0000 mg | ORAL_TABLET | Freq: Every day | ORAL | 1 refills | Status: DC
  Filled 2021-11-08: qty 90, 90d supply, fill #0
  Filled 2022-02-15: qty 90, 90d supply, fill #1

## 2021-11-08 MED ORDER — ROSUVASTATIN CALCIUM 20 MG PO TABS
20.0000 mg | ORAL_TABLET | Freq: Every day | ORAL | 1 refills | Status: DC
  Filled 2021-11-08: qty 90, 90d supply, fill #0
  Filled 2022-02-15: qty 90, 90d supply, fill #1

## 2021-11-08 MED ORDER — MELOXICAM 15 MG PO TABS
15.0000 mg | ORAL_TABLET | Freq: Every day | ORAL | 0 refills | Status: DC
  Filled 2021-11-08: qty 90, 90d supply, fill #0

## 2021-11-08 MED ORDER — FLUOXETINE HCL 20 MG PO CAPS
20.0000 mg | ORAL_CAPSULE | Freq: Every morning | ORAL | 1 refills | Status: DC
  Filled 2021-11-08: qty 90, 90d supply, fill #0
  Filled 2022-01-31: qty 90, 90d supply, fill #1

## 2021-11-08 MED ORDER — LOSARTAN POTASSIUM-HCTZ 100-25 MG PO TABS
1.0000 | ORAL_TABLET | Freq: Every day | ORAL | 1 refills | Status: DC
  Filled 2021-11-08: qty 90, 90d supply, fill #0
  Filled 2022-02-15: qty 90, 90d supply, fill #1

## 2021-11-08 MED ORDER — BUPROPION HCL ER (XL) 300 MG PO TB24
300.0000 mg | ORAL_TABLET | Freq: Every day | ORAL | 0 refills | Status: DC
  Filled 2021-11-08: qty 90, 90d supply, fill #0

## 2021-11-09 ENCOUNTER — Other Ambulatory Visit (HOSPITAL_COMMUNITY): Payer: Self-pay

## 2021-11-20 ENCOUNTER — Other Ambulatory Visit (HOSPITAL_COMMUNITY): Payer: Self-pay

## 2021-11-20 NOTE — Progress Notes (Signed)
Chief Complaint:   OBESITY Crystal Roberts is here to discuss her progress with her obesity treatment plan along with follow-up of her obesity related diagnoses. Crystal Roberts is on the Category 2 Plan and states she is following her eating plan approximately 80-90% of the time. Crystal Roberts states she is at the gym for 60-120 minutes 2 times per week.  Today's visit was #: 8 Starting weight: 241 lbs Starting date: 09/10/2017 Today's weight: 212 lbs Today's date: 11/07/2021 Total lbs lost to date: 29 Total lbs lost since last in-office visit: 0  Interim History: Crystal Roberts has gained 4 pounds since her last visit.  She had a friend whose father passed away.  She she is getting her water and protein in.   Subjective:   1. Irregular bowel habits Crystal Roberts was prescribed Linzess on 09/13/2021.  She has increased her water and fiber intake.  2. Diabetes mellitus type 2 in obese Crystal Roberts) Crystal Roberts is currently taking Mounjaro 7.5 mg once weekly.  Assessment/Plan:   1. Irregular bowel habits Sanika will continue Linzess 145 mcg once daily, and we will refill for 2 months.  - linaclotide (LINZESS) 145 MCG CAPS capsule; Take 1 capsule (145 mcg total) by mouth daily before breakfast.  Dispense: 30 capsule; Refill: 1  2. Diabetes mellitus type 2 in obese Crystal Roberts) Crystal Roberts agreed to increase Mounjaro from 7.5 mg to 10 mg once weekly, and we will refill for 1 month.  - tirzepatide (MOUNJARO) 10 MG/0.5ML Pen; Inject 10 mg into the skin once a week.  Dispense: 2 mL; Refill: 0  3. Obesity with current BMI of 36.5 Crystal Roberts is currently in the action stage of change. As such, her goal is to continue with weight loss efforts. She has agreed to the Category 2 Plan.   Meal planning was discussed.  She will adhere more closely to the plan 80-90%.  Exercise goals: As is.   Behavioral modification strategies: increasing lean protein intake, decreasing simple carbohydrates, increasing vegetables, increasing water intake, decreasing eating  out, no skipping meals, meal planning and cooking strategies, keeping healthy foods in the home, and planning for success.  Crystal Roberts has agreed to follow-up with our clinic in 4 weeks. She was informed of the importance of frequent follow-up visits to maximize her success with intensive lifestyle modifications for her multiple health conditions.   Objective:   Blood pressure (!) 160/84, pulse 67, temperature 98 F (36.7 C), height '5\' 4"'$  (1.626 m), weight 212 lb (96.2 kg), SpO2 98 %. Body mass index is 36.39 kg/m.  General: Cooperative, alert, well developed, in no acute distress. HEENT: Conjunctivae and lids unremarkable. Cardiovascular: Regular rhythm.  Lungs: Normal work of breathing. Neurologic: No focal deficits.   Lab Results  Component Value Date   CREATININE 0.83 01/05/2020   BUN 12 01/05/2020   NA 141 01/05/2020   K 4.6 01/05/2020   CL 102 01/05/2020   CO2 24 01/05/2020   Lab Results  Component Value Date   ALT 22 01/05/2020   AST 24 01/05/2020   ALKPHOS 95 01/05/2020   BILITOT 0.2 01/05/2020   Lab Results  Component Value Date   HGBA1C 6.0 (H) 11/18/2017   HGBA1C 6.1 (H) 09/10/2017   HGBA1C 5.8 (H) 08/13/2016   Lab Results  Component Value Date   INSULIN 16.8 01/05/2020   INSULIN 27.5 (H) 09/10/2017   Lab Results  Component Value Date   TSH 1.080 01/05/2020   Lab Results  Component Value Date   CHOL 196 01/05/2020  HDL 67 01/05/2020   LDLCALC 109 (H) 01/05/2020   TRIG 113 01/05/2020   Lab Results  Component Value Date   VD25OH 43.8 01/05/2020   VD25OH 46.3 09/10/2017   Lab Results  Component Value Date   WBC 8.6 11/18/2017   HGB 13.9 11/18/2017   HCT 43.8 11/18/2017   MCV 93.8 11/18/2017   PLT 361 11/18/2017   No results found for: "IRON", "TIBC", "FERRITIN"  Attestation Statements:   Reviewed by clinician on day of visit: allergies, medications, problem list, medical history, surgical history, family history, social history, and previous  encounter notes.   Wilhemena Durie, am acting as Location manager for [employer], DO.  I have reviewed the above documentation for accuracy and completeness, and I agree with the above. Jearld Lesch, DO

## 2021-11-26 ENCOUNTER — Encounter: Payer: Self-pay | Admitting: Bariatrics

## 2021-11-29 ENCOUNTER — Ambulatory Visit (HOSPITAL_COMMUNITY)
Admission: RE | Admit: 2021-11-29 | Discharge: 2021-11-29 | Disposition: A | Discharge status: Home / Self Care | Payer: 59 | Source: Ambulatory Visit | Attending: Family Medicine | Admitting: Family Medicine

## 2021-11-29 DIAGNOSIS — Z1231 Encounter for screening mammogram for malignant neoplasm of breast: Secondary | ICD-10-CM | POA: Insufficient documentation

## 2021-12-05 ENCOUNTER — Ambulatory Visit: Payer: 59 | Admitting: Bariatrics

## 2021-12-13 ENCOUNTER — Other Ambulatory Visit (HOSPITAL_COMMUNITY): Payer: Self-pay

## 2021-12-13 ENCOUNTER — Ambulatory Visit: Payer: 59 | Admitting: Bariatrics

## 2021-12-13 ENCOUNTER — Encounter: Payer: Self-pay | Admitting: Bariatrics

## 2021-12-13 VITALS — BP 123/65 | HR 72 | Temp 98.0°F | Ht 64.0 in | Wt 202.0 lb

## 2021-12-13 DIAGNOSIS — Z7985 Long-term (current) use of injectable non-insulin antidiabetic drugs: Secondary | ICD-10-CM

## 2021-12-13 DIAGNOSIS — Z6834 Body mass index (BMI) 34.0-34.9, adult: Secondary | ICD-10-CM

## 2021-12-13 DIAGNOSIS — E038 Other specified hypothyroidism: Secondary | ICD-10-CM

## 2021-12-13 DIAGNOSIS — E669 Obesity, unspecified: Secondary | ICD-10-CM

## 2021-12-13 DIAGNOSIS — I10 Essential (primary) hypertension: Secondary | ICD-10-CM

## 2021-12-13 DIAGNOSIS — E559 Vitamin D deficiency, unspecified: Secondary | ICD-10-CM

## 2021-12-13 DIAGNOSIS — E1169 Type 2 diabetes mellitus with other specified complication: Secondary | ICD-10-CM | POA: Diagnosis not present

## 2021-12-13 DIAGNOSIS — E785 Hyperlipidemia, unspecified: Secondary | ICD-10-CM | POA: Diagnosis not present

## 2021-12-13 DIAGNOSIS — E538 Deficiency of other specified B group vitamins: Secondary | ICD-10-CM | POA: Diagnosis not present

## 2021-12-13 DIAGNOSIS — Z7984 Long term (current) use of oral hypoglycemic drugs: Secondary | ICD-10-CM

## 2021-12-13 MED ORDER — TIRZEPATIDE 10 MG/0.5ML ~~LOC~~ SOAJ
10.0000 mg | SUBCUTANEOUS | 0 refills | Status: DC
  Filled 2021-12-13: qty 2, 28d supply, fill #0

## 2021-12-14 ENCOUNTER — Other Ambulatory Visit (HOSPITAL_COMMUNITY): Payer: Self-pay

## 2021-12-14 LAB — LIPID PANEL WITH LDL/HDL RATIO
Cholesterol, Total: 169 mg/dL (ref 100–199)
HDL: 66 mg/dL (ref >39)
LDL Chol Calc (NIH): 88 mg/dL (ref 0–99)
LDL/HDL Ratio: 1.3 ratio (ref 0.0–3.2)
Triglycerides: 79 mg/dL (ref 0–149)
VLDL Cholesterol Cal: 15 mg/dL (ref 5–40)

## 2021-12-14 LAB — VITAMIN B12: Vitamin B-12: 413 pg/mL (ref 232–1245)

## 2021-12-14 LAB — COMPREHENSIVE METABOLIC PANEL
ALT: 15 IU/L (ref 0–32)
AST: 16 IU/L (ref 0–40)
Albumin/Globulin Ratio: 1.6 (ref 1.2–2.2)
Albumin: 4.6 g/dL (ref 3.9–4.9)
Alkaline Phosphatase: 98 IU/L (ref 44–121)
BUN/Creatinine Ratio: 19 (ref 12–28)
BUN: 16 mg/dL (ref 8–27)
Bilirubin Total: 0.3 mg/dL (ref 0.0–1.2)
CO2: 23 mmol/L (ref 20–29)
Calcium: 9.7 mg/dL (ref 8.7–10.3)
Chloride: 100 mmol/L (ref 96–106)
Creatinine, Ser: 0.84 mg/dL (ref 0.57–1.00)
Globulin, Total: 2.8 g/dL (ref 1.5–4.5)
Glucose: 81 mg/dL (ref 70–99)
Potassium: 4 mmol/L (ref 3.5–5.2)
Sodium: 139 mmol/L (ref 134–144)
Total Protein: 7.4 g/dL (ref 6.0–8.5)
eGFR: 78 mL/min/{1.73_m2} (ref >59)

## 2021-12-14 LAB — VITAMIN D 25 HYDROXY (VIT D DEFICIENCY, FRACTURES): Vit D, 25-Hydroxy: 52.9 ng/mL (ref 30.0–100.0)

## 2021-12-14 LAB — TSH+T4F+T3FREE
Free T4: 1.49 ng/dL (ref 0.82–1.77)
T3, Free: 2.6 pg/mL (ref 2.0–4.4)
TSH: 2.41 u[IU]/mL (ref 0.450–4.500)

## 2021-12-14 LAB — HEMOGLOBIN A1C
Est. average glucose Bld gHb Est-mCnc: 120 mg/dL
Hgb A1c MFr Bld: 5.8 % — ABNORMAL HIGH (ref 4.8–5.6)

## 2021-12-14 LAB — INSULIN, RANDOM: INSULIN: 14 u[IU]/mL (ref 2.6–24.9)

## 2021-12-26 NOTE — Progress Notes (Signed)
Chief Complaint:   OBESITY Crystal Roberts is here to discuss her progress with her obesity treatment plan along with follow-up of her obesity related diagnoses. Crystal Roberts is on the Category 2 Plan and states she is following her eating plan approximately 90-95% of the time. Crystal Roberts states she is at the gym and walking for 45 minutes 3 times per week.  Today's visit was #: 28 Starting weight: 241 lbs Starting date: 09/10/2017 Today's weight: 202 lbs Today's date: 12/13/2021 Total lbs lost to date: 39 Total lbs lost since last in-office visit: 10  Interim History: Crystal Roberts is down another 10 lbs since her last visit and she is doing well. She is working on getting a new house. She is doing well with her protein.   Subjective:   1. Diabetes mellitus type 2 in obese [facility]) Crystal Roberts is taking metformin and Mounjaro.   2. Hyperlipidemia associated with type 2 diabetes mellitus ([place]) Crystal Roberts is taking Crestor currently.   3. Essential hypertension Crystal Roberts's blood pressure is stable.   4. Vitamin D deficiency Crystal Roberts is taking Vitamin D OTC.   5. Other specified hypothyroidism Crystal Roberts is taking Synthroid  6. Vitamin B 12 deficiency Crystal Roberts is not on B12 supplementation.   Assessment/Plan:   1. Diabetes mellitus type 2 in obese [facility]) We will check labs today, and we will refill Mounjaro for 1 month.   - tirzepatide (MOUNJARO) 10 MG/0.5ML Pen; Inject 10 mg into the skin once a week.  Dispense: 2 mL; Refill: 0 - Hemoglobin A1c - Insulin, random - Comprehensive metabolic panel  2. Hyperlipidemia associated with type 2 diabetes mellitus ([place]) We will check labs today, and Crystal Roberts will continue her medications as directed.   - Lipid Panel With LDL/HDL Ratio - Comprehensive metabolic panel  3. Essential hypertension We will check labs today, and Crystal Roberts will continue her medications as directed.   - Comprehensive metabolic panel  4. Vitamin D deficiency We will check labs today, and Crystal Roberts will continue  OTC Vitamin D.   - VITAMIN D 25 Hydroxy (Vit-D Deficiency, Fractures)  5. Other specified hypothyroidism We will check labs today, and Crystal Roberts will continue Synthroid as directed.    - TSH+T4F+T3Free  6. Vitamin B 12 deficiency We will check labs today, and we will follow-up at [facility] next visit.   - Vitamin B12  7. Obesity with current BMI of 34.7 Crystal Roberts is currently in the action stage of change. As such, her goal is to continue with weight loss efforts. She has agreed to the Category 2 Plan.   Meal planning was discussed. She will adhere to the plan 90-95%.   Exercise goals: As is.   Behavioral modification strategies: increasing lean protein intake, decreasing simple carbohydrates, increasing vegetables, increasing water intake, decreasing eating out, no skipping meals, meal planning and cooking strategies, keeping healthy foods in the home, and planning for success.  Crystal Roberts has agreed to follow-up with our clinic in 4 weeks. She was informed of the importance of frequent follow-up visits to maximize her success with intensive lifestyle modifications for her multiple health conditions.   Crystal Roberts was informed we would discuss her lab results at her next visit unless there is a critical issue that needs to be addressed sooner. Crystal Roberts agreed to keep her next visit at the agreed upon time to discuss these results.  Objective:   Blood pressure 123/65, pulse 72, temperature 98 F (36.7 C), height '5\' 4"'$  (1.626 m), weight 202 lb (91.6 kg), SpO2 98 %. Body  mass index is 34.67 kg/m.  General: Cooperative, alert, well developed, in no acute distress. HEENT: Conjunctivae and lids unremarkable. Cardiovascular: Regular rhythm.  Lungs: Normal work of breathing. Neurologic: No focal deficits.   Lab Results  Component Value Date   CREATININE 0.84 12/13/2021   BUN 16 12/13/2021   NA 139 12/13/2021   K 4.0 12/13/2021   CL 100 12/13/2021   CO2 23 12/13/2021   Lab Results  Component Value  Date   ALT 15 12/13/2021   AST 16 12/13/2021   ALKPHOS 98 12/13/2021   BILITOT 0.3 12/13/2021   Lab Results  Component Value Date   HGBA1C 5.8 (H) 12/13/2021   HGBA1C 6.0 (H) 11/18/2017   HGBA1C 6.1 (H) 09/10/2017   HGBA1C 5.8 (H) 08/13/2016   Lab Results  Component Value Date   INSULIN 14.0 12/13/2021   INSULIN 16.8 01/05/2020   INSULIN 27.5 (H) 09/10/2017   Lab Results  Component Value Date   TSH 2.410 12/13/2021   Lab Results  Component Value Date   CHOL 169 12/13/2021   HDL 66 12/13/2021   LDLCALC 88 12/13/2021   TRIG 79 12/13/2021   Lab Results  Component Value Date   VD25OH 52.9 12/13/2021   VD25OH 43.8 01/05/2020   VD25OH 46.3 09/10/2017   Lab Results  Component Value Date   WBC 8.6 11/18/2017   HGB 13.9 11/18/2017   HCT 43.8 11/18/2017   MCV 93.8 11/18/2017   PLT 361 11/18/2017   No results found for: "IRON", "TIBC", "FERRITIN"  Attestation Statements:   Reviewed by clinician on day of visit: allergies, medications, problem list, medical history, surgical history, family history, social history, and previous encounter notes.   Wilhemena Durie, am acting as Location manager for [employer], DO.  I have reviewed the above documentation for accuracy and completeness, and I agree with the above. Jearld Lesch, DO

## 2021-12-31 ENCOUNTER — Encounter: Payer: Self-pay | Admitting: Bariatrics

## 2022-01-10 ENCOUNTER — Ambulatory Visit: Payer: 59 | Admitting: Bariatrics

## 2022-01-10 ENCOUNTER — Encounter: Payer: Self-pay | Admitting: Bariatrics

## 2022-01-10 ENCOUNTER — Other Ambulatory Visit (HOSPITAL_COMMUNITY): Payer: Self-pay

## 2022-01-10 VITALS — BP 128/80 | HR 75 | Temp 98.7°F | Ht 64.0 in | Wt 199.0 lb

## 2022-01-10 DIAGNOSIS — E785 Hyperlipidemia, unspecified: Secondary | ICD-10-CM

## 2022-01-10 DIAGNOSIS — Z7984 Long term (current) use of oral hypoglycemic drugs: Secondary | ICD-10-CM | POA: Diagnosis not present

## 2022-01-10 DIAGNOSIS — E1169 Type 2 diabetes mellitus with other specified complication: Secondary | ICD-10-CM

## 2022-01-10 DIAGNOSIS — Z6834 Body mass index (BMI) 34.0-34.9, adult: Secondary | ICD-10-CM | POA: Diagnosis not present

## 2022-01-10 DIAGNOSIS — Z7985 Long-term (current) use of injectable non-insulin antidiabetic drugs: Secondary | ICD-10-CM

## 2022-01-10 DIAGNOSIS — E669 Obesity, unspecified: Secondary | ICD-10-CM

## 2022-01-10 MED ORDER — TIRZEPATIDE 12.5 MG/0.5ML ~~LOC~~ SOAJ
12.5000 mg | SUBCUTANEOUS | 0 refills | Status: DC
  Filled 2022-01-10: qty 2, 28d supply, fill #0

## 2022-01-23 ENCOUNTER — Other Ambulatory Visit (HOSPITAL_COMMUNITY): Payer: Self-pay

## 2022-01-23 ENCOUNTER — Other Ambulatory Visit: Payer: Self-pay

## 2022-01-23 MED ORDER — METFORMIN HCL 500 MG PO TABS
250.0000 mg | ORAL_TABLET | Freq: Two times a day (BID) | ORAL | 0 refills | Status: DC
  Filled 2022-01-23: qty 90, 90d supply, fill #0

## 2022-01-24 ENCOUNTER — Other Ambulatory Visit (HOSPITAL_COMMUNITY): Payer: Self-pay

## 2022-01-24 MED ORDER — LEVOTHYROXINE SODIUM 75 MCG PO TABS
75.0000 ug | ORAL_TABLET | Freq: Every day | ORAL | 1 refills | Status: DC
  Filled 2022-01-24: qty 90, 90d supply, fill #0
  Filled 2022-06-03: qty 90, 90d supply, fill #1

## 2022-01-26 NOTE — Progress Notes (Unsigned)
Chief Complaint:   OBESITY Crystal Roberts is here to discuss her progress with her obesity treatment plan along with follow-up of her obesity related diagnoses. Crystal Roberts is on the Category 2 Plan and states she is following her eating plan approximately 90% of the time. Crystal Roberts states she is doing gym workouts for 60 minutes 2-3 times per week.  Today's visit was #: 22 Starting weight: 241 lbs Starting date: 09/10/2017 Today's weight: 199 lbs Today's date: 01/10/2022 Total lbs lost to date: 42 Total lbs lost since last in-office visit: 3  Interim History: Crystal Roberts is down 3 lbs since her last visit.   Subjective:   1. Diabetes mellitus type 2 in obese [facility]) [place] is taking metformin and Mounjaro as directed. Her last A1c was 5.8 and insulin 14.0.  2. Hyperlipidemia associated with type 2 diabetes mellitus (Crystal Roberts) Crystal Roberts is taking Crestor as directed.   Assessment/Plan:   1. Diabetes mellitus type 2 in obese [facility]) Crystal Roberts will continue her medications, and she agreed to increase Mounjaro to 12.5 mg from 10 mg once weekly, and we will refill for 1 month.   - tirzepatide (MOUNJARO) 12.5 MG/0.5ML Pen; Inject 12.5 mg into the skin once a week.  Dispense: 2 mL; Refill: 0  2. Hyperlipidemia associated with type 2 diabetes mellitus (Crystal Roberts) Crystal Roberts will continue Crestor, and she will continue with her meal plan and exercise.   3. Obesity,current BMI 34.2 Crystal Roberts is currently in the action stage of change. As such, her goal is to continue with weight loss efforts. She has agreed to the Category 2 Plan.   Meal planning and intentional eating were discussed. Reviewed labs with the patient from 12/13/2021, CMP, lipid, Vit D, B12, A1c, insulin, and thyroid panel. She will keep her protein and water intake high.   Exercise goals: As is.   Behavioral modification strategies: increasing lean protein intake, decreasing simple carbohydrates, increasing vegetables, increasing water intake, decreasing eating out, no  skipping meals, meal planning and cooking strategies, keeping healthy foods in the home, and planning for success.  Crystal Roberts has agreed to follow-up with our clinic in 4 weeks. She was informed of the importance of frequent follow-up visits to maximize her success with intensive lifestyle modifications for her multiple health conditions.   Objective:   Blood pressure 128/80, pulse 75, temperature 98.7 F (37.1 C), height '5\' 4"'$  (1.626 m), weight 199 lb (90.3 kg), SpO2 96 %. Body mass index is 34.16 kg/m.  General: Cooperative, alert, well developed, in no acute distress. HEENT: Conjunctivae and lids unremarkable. Cardiovascular: Regular rhythm.  Lungs: Normal work of breathing. Neurologic: No focal deficits.   Lab Results  Component Value Date   CREATININE 0.84 12/13/2021   BUN 16 12/13/2021   NA 139 12/13/2021   K 4.0 12/13/2021   CL 100 12/13/2021   CO2 23 12/13/2021   Lab Results  Component Value Date   ALT 15 12/13/2021   AST 16 12/13/2021   ALKPHOS 98 12/13/2021   BILITOT 0.3 12/13/2021   Lab Results  Component Value Date   HGBA1C 5.8 (H) 12/13/2021   HGBA1C 6.0 (H) 11/18/2017   HGBA1C 6.1 (H) 09/10/2017   HGBA1C 5.8 (H) 08/13/2016   Lab Results  Component Value Date   INSULIN 14.0 12/13/2021   INSULIN 16.8 01/05/2020   INSULIN 27.5 (H) 09/10/2017   Lab Results  Component Value Date   TSH 2.410 12/13/2021   Lab Results  Component Value Date   CHOL 169 12/13/2021  HDL 66 12/13/2021   LDLCALC 88 12/13/2021   TRIG 79 12/13/2021   Lab Results  Component Value Date   VD25OH 52.9 12/13/2021   VD25OH 43.8 01/05/2020   VD25OH 46.3 09/10/2017   Lab Results  Component Value Date   WBC 8.6 11/18/2017   HGB 13.9 11/18/2017   HCT 43.8 11/18/2017   MCV 93.8 11/18/2017   PLT 361 11/18/2017   No results found for: "IRON", "TIBC", "FERRITIN"  Attestation Statements:   Reviewed by clinician on day of visit: allergies, medications, problem list, medical  history, surgical history, family history, social history, and previous encounter notes.   Wilhemena Durie, am acting as Location manager for [employer], DO.  I have reviewed the above documentation for accuracy and completeness, and I agree with the above. Jearld Lesch, DO

## 2022-01-28 ENCOUNTER — Encounter: Payer: Self-pay | Admitting: Bariatrics

## 2022-01-31 ENCOUNTER — Other Ambulatory Visit (HOSPITAL_COMMUNITY): Payer: Self-pay

## 2022-01-31 ENCOUNTER — Other Ambulatory Visit: Payer: Self-pay

## 2022-01-31 MED ORDER — MELOXICAM 15 MG PO TABS
15.0000 mg | ORAL_TABLET | Freq: Every day | ORAL | 0 refills | Status: DC
  Filled 2022-01-31: qty 90, 90d supply, fill #0

## 2022-02-05 ENCOUNTER — Encounter: Payer: Self-pay | Admitting: Bariatrics

## 2022-02-05 ENCOUNTER — Other Ambulatory Visit (HOSPITAL_COMMUNITY): Payer: Self-pay

## 2022-02-05 ENCOUNTER — Ambulatory Visit: Payer: Commercial Managed Care - PPO | Admitting: Bariatrics

## 2022-02-05 VITALS — BP 126/71 | HR 72 | Temp 98.0°F | Ht 64.0 in | Wt 197.0 lb

## 2022-02-05 DIAGNOSIS — E785 Hyperlipidemia, unspecified: Secondary | ICD-10-CM | POA: Diagnosis not present

## 2022-02-05 DIAGNOSIS — Z6833 Body mass index (BMI) 33.0-33.9, adult: Secondary | ICD-10-CM

## 2022-02-05 DIAGNOSIS — E669 Obesity, unspecified: Secondary | ICD-10-CM

## 2022-02-05 DIAGNOSIS — E1169 Type 2 diabetes mellitus with other specified complication: Secondary | ICD-10-CM

## 2022-02-05 DIAGNOSIS — Z7985 Long-term (current) use of injectable non-insulin antidiabetic drugs: Secondary | ICD-10-CM | POA: Diagnosis not present

## 2022-02-05 MED ORDER — TIRZEPATIDE 12.5 MG/0.5ML ~~LOC~~ SOAJ
12.5000 mg | SUBCUTANEOUS | 0 refills | Status: DC
  Filled 2022-02-05: qty 2, 28d supply, fill #0

## 2022-02-06 ENCOUNTER — Other Ambulatory Visit (HOSPITAL_COMMUNITY): Payer: Self-pay

## 2022-02-07 ENCOUNTER — Ambulatory Visit: Payer: Commercial Managed Care - PPO | Admitting: Bariatrics

## 2022-02-07 ENCOUNTER — Other Ambulatory Visit (HOSPITAL_COMMUNITY): Payer: Self-pay

## 2022-02-12 NOTE — Progress Notes (Unsigned)
Chief Complaint:   OBESITY Crystal Roberts is here to discuss her progress with her obesity treatment plan along with follow-up of her obesity related diagnoses. Crystal Roberts is on the Category 2 Plan and states she is following her eating plan approximately 80% of the time. Crystal Roberts states she is walking for 15 minutes 3 times per week.  Today's visit was #: 37 Starting weight: 241 lbs Starting date: 09/10/2017 Today's weight: 197 lbs Today's date: 02/05/2022 Total lbs lost to date: 44 Total lbs lost since last in-office visit: 2  Interim History: Jammy is down 2 lbs since her last visit.   Subjective:   1. Diabetes mellitus type 2 in obese Crystal Roberts) Crystal Roberts denies side effects with Mounjaro.   2. Hyperlipidemia associated with type 2 diabetes mellitus (Crystal Roberts) Crystal Roberts is taking Crestor.   Assessment/Plan:   1. Diabetes mellitus type 2 in obese (Crystal Roberts) We will refill Mounjaro for 1 month.   - tirzepatide (MOUNJARO) 12.5 MG/0.5ML Pen; Inject 12.5 mg into the skin once a week.  Dispense: 2 mL; Refill: 0  2. Hyperlipidemia associated with type 2 diabetes mellitus (Crystal Roberts) Crystal Roberts will continue Crestor as directed.   3. Obesity,current BMI 33.8 Crystal Roberts is currently in the action stage of change. As such, her goal is to continue with weight loss efforts. She has agreed to the Category 2 Plan.   Meal planning and intentional eating were discussed.   Exercise goals: As is.   Behavioral modification strategies: increasing lean protein intake, decreasing simple carbohydrates, increasing vegetables, increasing water intake, decreasing eating out, no skipping meals, meal planning and cooking strategies, keeping healthy foods in the home, and planning for success.  Crystal Roberts has agreed to follow-up with our clinic in 4 weeks. She was informed of the importance of frequent follow-up visits to maximize her success with intensive lifestyle modifications for her multiple health conditions.   Objective:   Blood pressure  126/71, pulse 72, temperature 98 F (36.7 C), height '5\' 4"'$  (1.626 m), weight 197 lb (89.4 kg), SpO2 98 %. Body mass index is 33.81 kg/m.  General: Cooperative, alert, well developed, in no acute distress. HEENT: Conjunctivae and lids unremarkable. Cardiovascular: Regular rhythm.  Lungs: Normal work of breathing. Neurologic: No focal deficits.   Lab Results  Component Value Date   CREATININE 0.84 12/13/2021   BUN 16 12/13/2021   NA 139 12/13/2021   K 4.0 12/13/2021   CL 100 12/13/2021   CO2 23 12/13/2021   Lab Results  Component Value Date   ALT 15 12/13/2021   AST 16 12/13/2021   ALKPHOS 98 12/13/2021   BILITOT 0.3 12/13/2021   Lab Results  Component Value Date   HGBA1C 5.8 (H) 12/13/2021   HGBA1C 6.0 (H) 11/18/2017   HGBA1C 6.1 (H) 09/10/2017   HGBA1C 5.8 (H) 08/13/2016   Lab Results  Component Value Date   INSULIN 14.0 12/13/2021   INSULIN 16.8 01/05/2020   INSULIN 27.5 (H) 09/10/2017   Lab Results  Component Value Date   TSH 2.410 12/13/2021   Lab Results  Component Value Date   CHOL 169 12/13/2021   HDL 66 12/13/2021   LDLCALC 88 12/13/2021   TRIG 79 12/13/2021   Lab Results  Component Value Date   VD25OH 52.9 12/13/2021   VD25OH 43.8 01/05/2020   VD25OH 46.3 09/10/2017   Lab Results  Component Value Date   WBC 8.6 11/18/2017   HGB 13.9 11/18/2017   HCT 43.8 11/18/2017   MCV 93.8 11/18/2017  PLT 361 11/18/2017   No results found for: "IRON", "TIBC", "FERRITIN"  Attestation Statements:   Reviewed by clinician on day of visit: allergies, medications, problem list, medical history, surgical history, family history, social history, and previous encounter notes.   Wilhemena Durie, am acting as Location manager for [employer], DO.  I have reviewed the above documentation for accuracy and completeness, and I agree with the above. Crystal Lesch, DO

## 2022-02-13 ENCOUNTER — Encounter: Payer: Self-pay | Admitting: Bariatrics

## 2022-02-15 ENCOUNTER — Other Ambulatory Visit (HOSPITAL_COMMUNITY): Payer: Self-pay

## 2022-02-15 ENCOUNTER — Other Ambulatory Visit: Payer: Self-pay

## 2022-02-15 ENCOUNTER — Other Ambulatory Visit: Payer: Self-pay | Admitting: Bariatrics

## 2022-02-15 DIAGNOSIS — R198 Other specified symptoms and signs involving the digestive system and abdomen: Secondary | ICD-10-CM

## 2022-02-18 ENCOUNTER — Other Ambulatory Visit (HOSPITAL_COMMUNITY): Payer: Self-pay

## 2022-02-18 ENCOUNTER — Other Ambulatory Visit: Payer: Self-pay

## 2022-02-18 MED ORDER — LINACLOTIDE 145 MCG PO CAPS
145.0000 ug | ORAL_CAPSULE | Freq: Every day | ORAL | 1 refills | Status: DC
  Filled 2022-02-18: qty 30, 30d supply, fill #0

## 2022-02-25 ENCOUNTER — Other Ambulatory Visit (HOSPITAL_COMMUNITY): Payer: Self-pay

## 2022-03-05 ENCOUNTER — Other Ambulatory Visit (HOSPITAL_COMMUNITY): Payer: Self-pay

## 2022-03-05 MED ORDER — BUPROPION HCL ER (XL) 300 MG PO TB24
300.0000 mg | ORAL_TABLET | Freq: Every day | ORAL | 0 refills | Status: DC
  Filled 2022-03-05 – 2022-03-12 (×2): qty 90, 90d supply, fill #0

## 2022-03-07 ENCOUNTER — Encounter: Payer: Self-pay | Admitting: Bariatrics

## 2022-03-07 ENCOUNTER — Ambulatory Visit: Payer: Commercial Managed Care - PPO | Admitting: Bariatrics

## 2022-03-07 VITALS — BP 131/69 | HR 76 | Temp 98.3°F | Ht 64.0 in | Wt 191.0 lb

## 2022-03-07 DIAGNOSIS — E669 Obesity, unspecified: Secondary | ICD-10-CM

## 2022-03-07 DIAGNOSIS — E1169 Type 2 diabetes mellitus with other specified complication: Secondary | ICD-10-CM | POA: Diagnosis not present

## 2022-03-07 DIAGNOSIS — Z6832 Body mass index (BMI) 32.0-32.9, adult: Secondary | ICD-10-CM | POA: Diagnosis not present

## 2022-03-07 DIAGNOSIS — Z7985 Long-term (current) use of injectable non-insulin antidiabetic drugs: Secondary | ICD-10-CM | POA: Diagnosis not present

## 2022-03-07 DIAGNOSIS — E785 Hyperlipidemia, unspecified: Secondary | ICD-10-CM

## 2022-03-07 DIAGNOSIS — Z6834 Body mass index (BMI) 34.0-34.9, adult: Secondary | ICD-10-CM | POA: Insufficient documentation

## 2022-03-07 MED ORDER — TIRZEPATIDE 12.5 MG/0.5ML ~~LOC~~ SOAJ
12.5000 mg | SUBCUTANEOUS | 0 refills | Status: DC
  Filled 2022-03-07: qty 2, 28d supply, fill #0

## 2022-03-08 ENCOUNTER — Other Ambulatory Visit (HOSPITAL_COMMUNITY): Payer: Self-pay

## 2022-03-08 ENCOUNTER — Other Ambulatory Visit: Payer: Self-pay

## 2022-03-12 ENCOUNTER — Other Ambulatory Visit (HOSPITAL_COMMUNITY): Payer: Self-pay

## 2022-03-12 ENCOUNTER — Other Ambulatory Visit: Payer: Self-pay

## 2022-03-12 DIAGNOSIS — E1159 Type 2 diabetes mellitus with other circulatory complications: Secondary | ICD-10-CM | POA: Diagnosis not present

## 2022-03-15 ENCOUNTER — Other Ambulatory Visit (HOSPITAL_COMMUNITY): Payer: Self-pay

## 2022-03-19 NOTE — Progress Notes (Unsigned)
Chief Complaint:   OBESITY Crystal Roberts is here to discuss her progress with her obesity treatment plan along with follow-up of her obesity related diagnoses. Crystal Roberts is on the Category 2 plan and states she is following her eating plan approximately 85-90% of the time. Crystal Roberts states she has not been exercising.  Today's visit was #: 14 Starting weight: 241 lbs Starting date: 09/10/2017 Today's weight: 191 lbs Today's date: 03/07/22 Total lbs lost to date: 50 Total lbs lost since last in-office visit: -6  Interim History: She is down another 6 pounds from her last visit.  She is down an extra 50 pounds.  She is doing okay with her water and protein intake.  Subjective:   1. Diabetes mellitus type 2 in obese Crystal Roberts) Taking as directed.  No side effects.  2. Hyperlipidemia associated with type 2 diabetes mellitus (Crystal Roberts) Taking Crestor.  Assessment/Plan:   1. Diabetes mellitus type 2 in obese ([facility]) Refill- - tirzepatide (MOUNJARO) 12.5 MG/0.5ML Pen; Inject 12.5 mg into the skin once a week.  Dispense: 2 mL; Refill: 0  2. Hyperlipidemia associated with type 2 diabetes mellitus ([facility]) Continue Crestor.  3. Morbid obesity (Crystal Roberts) BMI 32.0-32.9,adult 1.  Meal planning. 2.  Intentional eating. 3.  Increase protein, will take yogurt to work.  Crystal Roberts is currently in the action stage of change. As such, her goal is to continue with weight loss efforts. She has agreed to the Category 1 plan.  Exercise goals: Do a.m. exercises.  Behavioral modification strategies: increasing lean protein intake, decreasing simple carbohydrates, increasing vegetables, increasing water intake, decreasing eating out, no skipping meals, meal planning and cooking strategies, keeping healthy foods in the home, and planning for success.  Crystal Roberts has agreed to follow-up with our clinic in 4 weeks. She was informed of the importance of frequent follow-up visits to maximize her success with intensive lifestyle modifications  for her multiple health conditions.    Objective:   Blood pressure 131/69, pulse 76, temperature 98.3 F (36.8 C), height '5\' 4"'$  (1.626 m), weight 191 lb (86.6 kg), SpO2 99 %. Body mass index is 32.79 kg/m.  General: Cooperative, alert, well developed, in no acute distress. HEENT: Conjunctivae and lids unremarkable. Cardiovascular: Regular rhythm.  Lungs: Normal work of breathing. Neurologic: No focal deficits.   Lab Results  Component Value Date   CREATININE 0.84 12/13/2021   BUN 16 12/13/2021   NA 139 12/13/2021   K 4.0 12/13/2021   CL 100 12/13/2021   CO2 23 12/13/2021   Lab Results  Component Value Date   ALT 15 12/13/2021   AST 16 12/13/2021   ALKPHOS 98 12/13/2021   BILITOT 0.3 12/13/2021   Lab Results  Component Value Date   HGBA1C 5.8 (H) 12/13/2021   HGBA1C 6.0 (H) 11/18/2017   HGBA1C 6.1 (H) 09/10/2017   HGBA1C 5.8 (H) 08/13/2016   Lab Results  Component Value Date   INSULIN 14.0 12/13/2021   INSULIN 16.8 01/05/2020   INSULIN 27.5 (H) 09/10/2017   Lab Results  Component Value Date   TSH 2.410 12/13/2021   Lab Results  Component Value Date   CHOL 169 12/13/2021   HDL 66 12/13/2021   LDLCALC 88 12/13/2021   TRIG 79 12/13/2021   Lab Results  Component Value Date   VD25OH 52.9 12/13/2021   VD25OH 43.8 01/05/2020   VD25OH 46.3 09/10/2017   Lab Results  Component Value Date   WBC 8.6 11/18/2017   HGB 13.9 11/18/2017   HCT  43.8 11/18/2017   MCV 93.8 11/18/2017   PLT 361 11/18/2017   No results found for: "IRON", "TIBC", "FERRITIN"  Attestation Statements:   Reviewed by clinician on day of visit: allergies, medications, problem list, medical history, surgical history, family history, social history, and previous encounter notes.  I, Dawn Whitmire, FNP-C, am acting as transcriptionist for Dr. Jearld Lesch.  I have reviewed the above documentation for accuracy and completeness, and I agree with the above. Jearld Lesch, DO

## 2022-03-21 ENCOUNTER — Encounter: Payer: Self-pay | Admitting: Bariatrics

## 2022-04-01 DIAGNOSIS — L259 Unspecified contact dermatitis, unspecified cause: Secondary | ICD-10-CM | POA: Diagnosis not present

## 2022-04-11 ENCOUNTER — Ambulatory Visit: Payer: Commercial Managed Care - PPO | Admitting: Bariatrics

## 2022-04-22 ENCOUNTER — Other Ambulatory Visit (HOSPITAL_COMMUNITY): Payer: Self-pay

## 2022-05-01 ENCOUNTER — Encounter: Payer: Self-pay | Admitting: Bariatrics

## 2022-05-01 ENCOUNTER — Ambulatory Visit: Payer: Commercial Managed Care - PPO | Admitting: Bariatrics

## 2022-05-01 ENCOUNTER — Other Ambulatory Visit (HOSPITAL_COMMUNITY): Payer: Self-pay

## 2022-05-01 VITALS — BP 122/71 | HR 62 | Temp 97.9°F | Ht 64.0 in | Wt 201.0 lb

## 2022-05-01 DIAGNOSIS — Z7985 Long-term (current) use of injectable non-insulin antidiabetic drugs: Secondary | ICD-10-CM | POA: Diagnosis not present

## 2022-05-01 DIAGNOSIS — Z6834 Body mass index (BMI) 34.0-34.9, adult: Secondary | ICD-10-CM | POA: Diagnosis not present

## 2022-05-01 DIAGNOSIS — E1169 Type 2 diabetes mellitus with other specified complication: Secondary | ICD-10-CM

## 2022-05-01 DIAGNOSIS — I10 Essential (primary) hypertension: Secondary | ICD-10-CM | POA: Diagnosis not present

## 2022-05-01 MED ORDER — TIRZEPATIDE 12.5 MG/0.5ML ~~LOC~~ SOAJ
12.5000 mg | SUBCUTANEOUS | 0 refills | Status: DC
  Filled 2022-05-01: qty 2, 28d supply, fill #0

## 2022-05-01 MED ORDER — TIRZEPATIDE 12.5 MG/0.5ML ~~LOC~~ SOAJ
12.5000 mg | SUBCUTANEOUS | 0 refills | Status: DC
  Filled 2022-05-01 – 2022-05-03 (×2): qty 4, 56d supply, fill #0

## 2022-05-03 ENCOUNTER — Other Ambulatory Visit (HOSPITAL_COMMUNITY): Payer: Self-pay

## 2022-05-03 ENCOUNTER — Other Ambulatory Visit: Payer: Self-pay

## 2022-05-06 ENCOUNTER — Other Ambulatory Visit (HOSPITAL_COMMUNITY): Payer: Self-pay

## 2022-05-07 NOTE — Progress Notes (Signed)
Chief Complaint:   OBESITY Crystal Roberts is here to discuss her progress with her obesity treatment plan along with follow-up of her obesity related diagnoses. Crystal Roberts is on the Category 1 Plan and states she is following her eating plan approximately 80% of the time. Crystal Roberts states she is walking for 30-45 minutes 4 times per week.  Today's visit was #: 32 Starting weight: 241 lbs Starting date: 09/10/2017 Today's weight: 201 lbs Today's date: 05/01/2022 Total lbs lost to date: 40 Total lbs lost since last in-office visit: 0  Interim History: Crystal Roberts is up 10 pounds since her last visit.  She has been eating out and cooking more meals that her grandmother likes.  Subjective:   1. Essential hypertension Crystal Roberts's blood pressure is controlled.  2. Type 2 diabetes mellitus with obesity Crystal Roberts is taking Mounjaro at 12.5 mg.  She has stopped her medication.  Assessment/Plan:   1. Essential hypertension Crystal Roberts will continue her medications.  2. Type 2 diabetes mellitus with obesity Crystal Roberts will continue Mounjaro at 12.5 mg once weekly, and we will refill for 1 month. Recipes II handout was given.   - tirzepatide (MOUNJARO) 12.5 MG/0.5ML Pen; Inject 12.5 mg into the skin once a week.  Dispense: 4 mL; Refill: 0  3. Morbid obesity  4. BMI 34.0-34.9,adult Crystal Roberts is currently in the action stage of change. As such, her goal is to continue with weight loss efforts. She has agreed to the Category 1 Plan.   Meal planning and intentional eating were discussed.  She will work on increasing her water and protein intake.  Exercise goals: As is.   Behavioral modification strategies: increasing lean protein intake, decreasing simple carbohydrates, increasing vegetables, increasing water intake, decreasing eating out, no skipping meals, meal planning and cooking strategies, keeping healthy foods in the home, and planning for success.  Crystal Roberts has agreed to follow-up with our clinic in 4 weeks. She was  informed of the importance of frequent follow-up visits to maximize her success with intensive lifestyle modifications for her multiple health conditions.   Objective:   Blood pressure 122/71, pulse 62, temperature 97.9 F (36.6 C), height  (1.626 m), weight 201 lb (91.2 kg), SpO2 97 %. Body mass index is 34.5 kg/m.  General: Cooperative, alert, well developed, in no acute distress. HEENT: Conjunctivae and lids unremarkable. Cardiovascular: Regular rhythm.  Lungs: Normal work of breathing. Neurologic: No focal deficits.   Lab Results  Component Value Date   CREATININE 0.84 12/13/2021   BUN 16 12/13/2021   NA 139 12/13/2021   K 4.0 12/13/2021   CL 100 12/13/2021   CO2 23 12/13/2021   Lab Results  Component Value Date   ALT 15 12/13/2021   AST 16 12/13/2021   ALKPHOS 98 12/13/2021   BILITOT 0.3 12/13/2021   Lab Results  Component Value Date   HGBA1C 5.8 (H) 12/13/2021   HGBA1C 6.0 (H) 11/18/2017   HGBA1C 6.1 (H) 09/10/2017   HGBA1C 5.8 (H) 08/13/2016   Lab Results  Component Value Date   INSULIN 14.0 12/13/2021   INSULIN 16.8 01/05/2020   INSULIN 27.5 (H) 09/10/2017   Lab Results  Component Value Date   TSH 2.410 12/13/2021   Lab Results  Component Value Date   CHOL 169 12/13/2021   HDL 66 12/13/2021   LDLCALC 88 12/13/2021   TRIG 79 12/13/2021   Lab Results  Component Value Date   VD25OH 52.9 12/13/2021   VD25OH 43.8 01/05/2020   VD25OH 46.3 09/10/2017  Lab Results  Component Value Date   WBC 8.6 11/18/2017   HGB 13.9 11/18/2017   HCT 43.8 11/18/2017   MCV 93.8 11/18/2017   PLT 361 11/18/2017   No results found for: "IRON", "TIBC", "FERRITIN"  Attestation Statements:   Reviewed by clinician on day of visit: allergies, medications, problem list, medical history, surgical history, family history, social history, and previous encounter notes.   Trude Mcburney, am acting as Energy manager for [employer], DO.  I have reviewed the  above documentation for accuracy and completeness, and I agree with the above. Corinna Capra, DO

## 2022-05-10 ENCOUNTER — Other Ambulatory Visit: Payer: Self-pay

## 2022-05-10 ENCOUNTER — Other Ambulatory Visit (HOSPITAL_COMMUNITY): Payer: Self-pay

## 2022-05-10 MED ORDER — ROSUVASTATIN CALCIUM 20 MG PO TABS
20.0000 mg | ORAL_TABLET | Freq: Every day | ORAL | 1 refills | Status: DC
  Filled 2022-05-10: qty 90, 90d supply, fill #0
  Filled 2023-02-04: qty 90, 90d supply, fill #1

## 2022-05-10 MED ORDER — MELOXICAM 15 MG PO TABS
15.0000 mg | ORAL_TABLET | Freq: Every day | ORAL | 0 refills | Status: DC
  Filled 2022-05-10: qty 90, 90d supply, fill #0

## 2022-05-10 MED ORDER — FLUOXETINE HCL 20 MG PO CAPS
20.0000 mg | ORAL_CAPSULE | Freq: Every morning | ORAL | 1 refills | Status: DC
  Filled 2022-05-10 (×2): qty 90, 90d supply, fill #0
  Filled 2022-11-19: qty 90, 90d supply, fill #1

## 2022-05-13 ENCOUNTER — Encounter: Payer: Self-pay | Admitting: Bariatrics

## 2022-05-31 ENCOUNTER — Ambulatory Visit: Payer: Commercial Managed Care - PPO | Admitting: Obstetrics & Gynecology

## 2022-06-03 ENCOUNTER — Other Ambulatory Visit (HOSPITAL_COMMUNITY): Payer: Self-pay

## 2022-06-05 ENCOUNTER — Other Ambulatory Visit (HOSPITAL_COMMUNITY)
Admission: RE | Admit: 2022-06-05 | Discharge: 2022-06-05 | Disposition: A | Discharge status: Home / Self Care | Payer: Commercial Managed Care - PPO | Source: Ambulatory Visit | Attending: Obstetrics & Gynecology | Admitting: Obstetrics & Gynecology

## 2022-06-05 ENCOUNTER — Encounter: Payer: Self-pay | Admitting: Obstetrics & Gynecology

## 2022-06-05 ENCOUNTER — Ambulatory Visit (INDEPENDENT_AMBULATORY_CARE_PROVIDER_SITE_OTHER): Payer: Commercial Managed Care - PPO | Admitting: Obstetrics & Gynecology

## 2022-06-05 VITALS — BP 111/66 | HR 68 | Ht 64.0 in | Wt 194.0 lb

## 2022-06-05 DIAGNOSIS — Z01419 Encounter for gynecological examination (general) (routine) without abnormal findings: Secondary | ICD-10-CM | POA: Insufficient documentation

## 2022-06-05 NOTE — Progress Notes (Signed)
WELL-WOMAN EXAMINATION Patient name: Crystal Roberts MRN [id number]  Date of birth: Sep 30, 1955 Chief Complaint:   Gynecologic Exam  History of Present Illness:   Crystal Roberts is a 67 y.o. G2P0020 PM female being seen today for a routine well-woman exam.  Today she notes no acute complaints or concerns.  Denies any further episodes of vaginal bleeding.  Denies itching discharge or irritation.  Denies pelvic or abdominal pain.   No LMP recorded. Patient is postmenopausal.  Last pap 2021.  Last mammogram: 11/2021. Last colonoscopy: pt due/in process     06/05/2022    2:02 PM 09/03/2019    9:54 AM 09/10/2017    7:48 AM 07/11/2017    8:47 AM  Depression screen PHQ 2/9  Decreased Interest 0 1 2 0  Down, Depressed, Hopeless 0 1 1 0  PHQ - 2 Score 0 2 3 0  Altered sleeping 0 0 1   Tired, decreased energy 1 1 2    Change in appetite 0 1 1   Feeling bad or failure about yourself  0 0 1   Trouble concentrating 0 0 1   Moving slowly or fidgety/restless 0 0 0   Suicidal thoughts 0 0 0   PHQ-9 Score 1 4 9    Difficult doing work/chores  Not difficult at all Not difficult at all       Review of Systems:   Pertinent items are noted in HPI Denies any headaches, blurred vision, fatigue, shortness of breath, chest pain, abdominal pain, bowel movements, urination, or intercourse unless otherwise stated above.  Pertinent History Reviewed:  Reviewed past medical,surgical, social and family history.  Reviewed problem list, medications and allergies. Physical Assessment:   Vitals:   06/05/22 1403  BP: 111/66  Pulse: 68  Weight: 194 lb (88 kg)  Height: 5\' 4"  (1.626 m)  Body mass index is 33.3 kg/m.        Physical Examination:   General appearance - well appearing, and in no distress  Mental status - alert, oriented to person, place, and time  Psych:  She has a normal mood and affect  Skin - warm and dry, normal color, no suspicious lesions noted  Chest - effort normal, all lung  fields clear to auscultation bilaterally  Heart - normal rate and regular rhythm  Neck:  midline trachea, no thyromegaly or nodules  Breasts - breasts appear normal, no suspicious masses, no skin or nipple changes or  axillary nodes  Abdomen - obese, soft, nontender, nondistended, no masses or organomegaly  Pelvic - VULVA: normal appearing vulva with no masses, tenderness or lesions  VAGINA: normal appearing vagina with normal color and discharge, no lesions  CERVIX: normal appearing cervix without discharge or lesions, no CMT  Thin prep pap is done with HR HPV cotesting  UTERUS: uterus is felt to be normal size, shape, consistency and nontender   ADNEXA: No adnexal masses or tenderness noted.  Extremities:  No swelling or varicosities noted  Chaperone:  pt declined      Assessment & Plan:  1) Well-Woman Exam -Pap collected, reviewed ASSCP guidelines -mammogram due in Nov -in the process of trying to complete colonoscopy- needs to find old records  No orders of the defined types were placed in this encounter.   Meds: No orders of the defined types were placed in this encounter.   Follow-up: Return in about 1 year (around 06/05/2023) for Annual.   Myna Hidalgo, DO Attending Obstetrician & Gynecologist, [facility]  for [employer], [facility]

## 2022-06-06 ENCOUNTER — Other Ambulatory Visit (HOSPITAL_COMMUNITY): Payer: Self-pay

## 2022-06-06 ENCOUNTER — Ambulatory Visit (INDEPENDENT_AMBULATORY_CARE_PROVIDER_SITE_OTHER): Payer: Commercial Managed Care - PPO | Admitting: Bariatrics

## 2022-06-06 ENCOUNTER — Encounter: Payer: Self-pay | Admitting: Bariatrics

## 2022-06-06 VITALS — BP 125/70 | HR 70 | Temp 98.4°F | Ht 64.0 in | Wt 190.0 lb

## 2022-06-06 DIAGNOSIS — E1169 Type 2 diabetes mellitus with other specified complication: Secondary | ICD-10-CM | POA: Diagnosis not present

## 2022-06-06 DIAGNOSIS — E038 Other specified hypothyroidism: Secondary | ICD-10-CM | POA: Diagnosis not present

## 2022-06-06 DIAGNOSIS — Z6832 Body mass index (BMI) 32.0-32.9, adult: Secondary | ICD-10-CM | POA: Diagnosis not present

## 2022-06-06 DIAGNOSIS — E669 Obesity, unspecified: Secondary | ICD-10-CM | POA: Insufficient documentation

## 2022-06-06 DIAGNOSIS — Z7985 Long-term (current) use of injectable non-insulin antidiabetic drugs: Secondary | ICD-10-CM | POA: Diagnosis not present

## 2022-06-06 MED ORDER — TIRZEPATIDE 15 MG/0.5ML ~~LOC~~ SOAJ
15.0000 mg | SUBCUTANEOUS | 0 refills | Status: DC
  Filled 2022-06-06: qty 4, 56d supply, fill #0
  Filled 2022-07-19: qty 2, 28d supply, fill #0

## 2022-06-07 ENCOUNTER — Other Ambulatory Visit (HOSPITAL_COMMUNITY): Payer: Self-pay

## 2022-06-11 ENCOUNTER — Encounter: Payer: Self-pay | Admitting: Bariatrics

## 2022-06-11 LAB — CYTOLOGY - PAP
Comment: NEGATIVE
Diagnosis: NEGATIVE
High risk HPV: NEGATIVE

## 2022-06-11 NOTE — Progress Notes (Signed)
Chief Complaint:   OBESITY Crystal Roberts is here to discuss her progress with her obesity treatment plan along with follow-up of her obesity related diagnoses. [name] is on the Category 1 Plan and states she is following her eating plan approximately 85-95% of the time. [name] states she is walking for 30 minutes 3 times per week.  Today's visit was #: 33 Starting weight: 241 lbs Starting date: 09/10/2017 Today's weight: 190 lbs Today's date: 06/06/2022 Total lbs lost to date: 51 Total lbs lost since last in-office visit: 11  Interim History: Patient is down 11 pounds and she is doing well overall.  She is feeling hungry in the afternoon.  Subjective:   1. Type 2 diabetes mellitus with obesity (HCC) Patient is taking Mounjaro with no side effects noted.  2. Other specified hypothyroidism Patient is taking levothyroxine, and she notes her energy level is good.  Assessment/Plan:   1. Type 2 diabetes mellitus with obesity (HCC) Patient will continue Mounjaro at 15 mg once weekly, we will refill for 1 month.  - tirzepatide (MOUNJARO) 15 MG/0.5ML Pen; Inject 15 mg into the skin once a week.  Dispense: 4 mL; Refill: 0  2. Other specified hypothyroidism Patient will continue levothyroxine as directed.  3. Generalized obesity  4. BMI 32.0-32.9,adult Crystal Roberts is currently in the action stage of change. As such, her goal is to continue with weight loss efforts. She has agreed to the Category 2 Plan.   Patient will continue to adhere closely to the plan.  Keep protein and water intake high.  Will take a good smaller snack at work.  Decrease bread.  We will recheck fasting labs at her next visit.  Exercise goals: As is.  Behavioral modification strategies: increasing lean protein intake, decreasing simple carbohydrates, increasing vegetables, increasing water intake, decreasing eating out, no skipping meals, meal planning and cooking strategies, keeping healthy foods in the home, and  planning for success.  Crystal Roberts has agreed to follow-up with our clinic in 4 weeks. She was informed of the importance of frequent follow-up visits to maximize her success with intensive lifestyle modifications for her multiple health conditions.   Objective:   Blood pressure 125/70, pulse 70, temperature 98.4 F (36.9 C), height 5\' 4"  (1.626 m), weight 190 lb (86.2 kg), SpO2 99 %. Body mass index is 32.61 kg/m.  General: Cooperative, alert, well developed, in no acute distress. HEENT: Conjunctivae and lids unremarkable. Cardiovascular: Regular rhythm.  Lungs: Normal work of breathing. Neurologic: No focal deficits.   Lab Results  Component Value Date   CREATININE 0.84 12/13/2021   BUN 16 12/13/2021   NA 139 12/13/2021   K 4.0 12/13/2021   CL 100 12/13/2021   CO2 23 12/13/2021   Lab Results  Component Value Date   ALT 15 12/13/2021   AST 16 12/13/2021   ALKPHOS 98 12/13/2021   BILITOT 0.3 12/13/2021   Lab Results  Component Value Date   HGBA1C 5.8 (H) 12/13/2021   HGBA1C 6.0 (H) 11/18/2017   HGBA1C 6.1 (H) 09/10/2017   HGBA1C 5.8 (H) 08/13/2016   Lab Results  Component Value Date   INSULIN 14.0 12/13/2021   INSULIN 16.8 01/05/2020   INSULIN 27.5 (H) 09/10/2017   Lab Results  Component Value Date   TSH 2.410 12/13/2021   Lab Results  Component Value Date   CHOL 169 12/13/2021   HDL 66 12/13/2021   LDLCALC 88 12/13/2021   TRIG 79 12/13/2021   Lab Results  Component Value  Date   VD25OH 52.9 12/13/2021   VD25OH 43.8 01/05/2020   VD25OH 46.3 09/10/2017   Lab Results  Component Value Date   WBC 8.6 11/18/2017   HGB 13.9 11/18/2017   HCT 43.8 11/18/2017   MCV 93.8 11/18/2017   PLT 361 11/18/2017   No results found for: "IRON", "TIBC", "FERRITIN"  Attestation Statements:   Reviewed by clinician on day of visit: allergies, medications, problem list, medical history, surgical history, family history, social history, and previous encounter notes.   Trude Mcburney, am acting as Energy manager for [employer], DO.  I have reviewed the above documentation for accuracy and completeness, and I agree with the above. Corinna Capra, DO

## 2022-06-25 ENCOUNTER — Other Ambulatory Visit (HOSPITAL_COMMUNITY): Payer: Self-pay

## 2022-07-11 ENCOUNTER — Ambulatory Visit: Payer: Commercial Managed Care - PPO | Admitting: Bariatrics

## 2022-07-15 DIAGNOSIS — M47816 Spondylosis without myelopathy or radiculopathy, lumbar region: Secondary | ICD-10-CM | POA: Diagnosis not present

## 2022-07-16 ENCOUNTER — Ambulatory Visit: Payer: Commercial Managed Care - PPO | Admitting: Bariatrics

## 2022-07-19 ENCOUNTER — Other Ambulatory Visit (HOSPITAL_COMMUNITY): Payer: Self-pay

## 2022-07-30 ENCOUNTER — Other Ambulatory Visit (HOSPITAL_COMMUNITY): Payer: Self-pay

## 2022-07-30 ENCOUNTER — Other Ambulatory Visit: Payer: Self-pay

## 2022-07-30 MED ORDER — BUPROPION HCL ER (XL) 300 MG PO TB24
300.0000 mg | ORAL_TABLET | Freq: Every day | ORAL | 0 refills | Status: DC
  Filled 2022-07-30: qty 90, 90d supply, fill #0

## 2022-07-30 MED ORDER — LOSARTAN POTASSIUM-HCTZ 100-25 MG PO TABS
1.0000 | ORAL_TABLET | Freq: Every day | ORAL | 1 refills | Status: DC
  Filled 2022-07-30: qty 90, 90d supply, fill #0
  Filled 2022-12-02: qty 90, 90d supply, fill #1

## 2022-07-30 MED ORDER — MELOXICAM 15 MG PO TABS
15.0000 mg | ORAL_TABLET | Freq: Every day | ORAL | 0 refills | Status: DC
  Filled 2022-07-30: qty 90, 90d supply, fill #0

## 2022-07-31 ENCOUNTER — Ambulatory Visit (INDEPENDENT_AMBULATORY_CARE_PROVIDER_SITE_OTHER): Payer: Commercial Managed Care - PPO | Admitting: Bariatrics

## 2022-07-31 ENCOUNTER — Other Ambulatory Visit (HOSPITAL_COMMUNITY): Payer: Self-pay

## 2022-07-31 ENCOUNTER — Encounter: Payer: Self-pay | Admitting: Bariatrics

## 2022-07-31 VITALS — BP 187/92 | HR 65 | Temp 97.9°F | Ht 64.0 in | Wt 197.0 lb

## 2022-07-31 DIAGNOSIS — E038 Other specified hypothyroidism: Secondary | ICD-10-CM | POA: Diagnosis not present

## 2022-07-31 DIAGNOSIS — Z7985 Long-term (current) use of injectable non-insulin antidiabetic drugs: Secondary | ICD-10-CM

## 2022-07-31 DIAGNOSIS — E1169 Type 2 diabetes mellitus with other specified complication: Secondary | ICD-10-CM

## 2022-07-31 DIAGNOSIS — Z6833 Body mass index (BMI) 33.0-33.9, adult: Secondary | ICD-10-CM

## 2022-07-31 DIAGNOSIS — E559 Vitamin D deficiency, unspecified: Secondary | ICD-10-CM

## 2022-07-31 DIAGNOSIS — E538 Deficiency of other specified B group vitamins: Secondary | ICD-10-CM | POA: Diagnosis not present

## 2022-07-31 DIAGNOSIS — E039 Hypothyroidism, unspecified: Secondary | ICD-10-CM | POA: Diagnosis not present

## 2022-07-31 DIAGNOSIS — E669 Obesity, unspecified: Secondary | ICD-10-CM

## 2022-07-31 DIAGNOSIS — E785 Hyperlipidemia, unspecified: Secondary | ICD-10-CM

## 2022-07-31 DIAGNOSIS — I10 Essential (primary) hypertension: Secondary | ICD-10-CM

## 2022-07-31 MED ORDER — TIRZEPATIDE 15 MG/0.5ML ~~LOC~~ SOAJ
15.0000 mg | SUBCUTANEOUS | 0 refills | Status: DC
Start: 2022-07-31 — End: 2022-09-26
  Filled 2022-07-31 – 2022-08-12 (×2): qty 4, 56d supply, fill #0

## 2022-07-31 NOTE — Progress Notes (Signed)
WEIGHT SUMMARY AND BIOMETRICS  Weight Gained Since Last Visit: 7   Vitals Temp: 97.9 F (36.6 C) BP: (!) 187/92 Pulse Rate: 65 SpO2: 100 %   Anthropometric Measurements Height: 5\' 4"  (1.626 m) Weight: 197 lb (89.4 kg) BMI (Calculated): 33.8 Weight at Last Visit: 190lb Weight Gained Since Last Visit: 7 Starting Weight: 241lb Total Weight Loss (lbs): 44 lb (20 kg)   Body Composition  Body Fat %: 44.3 % Fat Mass (lbs): 87.4 lbs Muscle Mass (lbs): 104.4 lbs Total Body Water (lbs): 82.2 lbs Visceral Fat Rating : 13   Other Clinical Data Fasting: no Labs: no Today's Visit #: 34 Starting Date: 09/10/17    OBESITY Crystal Roberts here to discuss her progress with her obesity treatment plan along with follow-up of her obesity related diagnoses.     Nutrition Plan: the Category 2 plan - 50% adherence.  Current exercise: walking  Interim History:  She Roberts up 7 lbs since her last appointment.  Eating all of the food on the plan., Protein intake Roberts as prescribed, Roberts drinking sugar sweetened beverages, Roberts not skipping meals, and Water intake Roberts adequate.  Pharmacotherapy: Crystal Roberts on Mounjaro 15 mg SQ weekly Adverse side effects: None Hunger Roberts moderately controlled.  Cravings are moderately controlled.  Assessment/Plan:   1. Type 2 diabetes mellitus with obesity (HCC) Type II Diabetes HgbA1c Roberts at goal. Last A1c was 5.8 CBGs: Not checking      Episodes of hypoglycemia: no Medication(s): Mounjaro 15 mg SQ weekly  Lab Results  Component Value Date   HGBA1C 5.8 (H) 12/13/2021   HGBA1C 6.0 (H) 11/18/2017   HGBA1C 6.1 (H) 09/10/2017   Lab Results  Component Value Date   LDLCALC 88 12/13/2021   CREATININE 0.84 12/13/2021    Plan: Continue and refill Mounjaro 15 mg SQ weekly Continue all other medications.  Will keep all carbohydrates low both sweets and starches.  Will continue exercise regimen to 30 to 60 minutes on most days of the week.  Aim for  7 to 9 hours of sleep nightly.  Eat more low glycemic index foods.    Hypertension:   Taking medications as directed. Uncontrolled.   Plan: Continue medications as directed. No added salt.  She will continue her medications and will check her blood pressure at home.    Hyperlipidemia:   Taking medications as directed.   Plan: Will check lipids. Continue medications. Continue diet and exercise.    Vitamin D deficiency:   Taking vitamin D OTC  Plan: Continue vitamin D. Will check vitamin D.    Hypothyroidism:   Taking medications as directed. Denies any side effects.   Plan: Will check thyroid panel. Continue medications.    Vitamin D Deficiency Vitamin D Roberts at goal of 50.  Most recent vitamin D level was 52.9. She Roberts on  prescription ergocalciferol 50,000 IU weekly. Lab Results  Component Value Date   VD25OH 52.9 12/13/2021   VD25OH 43.8 01/05/2020   VD25OH 46.3 09/10/2017    Plan: Refill prescription vitamin D 50,000 IU weekly.    Vitamin B 12 deficiency:   She Roberts not currently taking B 12 , but Roberts taking a MV.   Plan: Will check B 12.   Labs done today (CMP, Lipids, HgbA1c, insulin, vitamin D, B 12, and thyroid panel).    Generalized Obesity: Current BMI BMI (Calculated): 33.8   Pharmacotherapy Plan Continue and refill  Mounjaro 15 mg SQ weekly 4 ml with 0 refills  Crystal Roberts currently in the action stage of change. As such, her goal Roberts to continue with weight loss efforts.  She has agreed to the Category 2 plan.  Exercise goals: All adults should avoid inactivity. Some physical activity Roberts better than none, and adults who participate in any amount of physical activity gain some health benefits.  Behavioral modification strategies: increasing lean protein intake, no meal skipping, meal planning , better snacking choices, planning for success, increasing vegetables, and keep healthy foods in the home.  Crystal Roberts has agreed to follow-up with our clinic in 4  weeks.      Objective:   VITALS: Per patient if applicable, see vitals. GENERAL: Alert and in no acute distress. CARDIOPULMONARY: No increased WOB. Speaking in clear sentences.  PSYCH: Pleasant and cooperative. Speech normal rate and rhythm. Affect Roberts appropriate. Insight and judgement are appropriate. Attention Roberts focused, linear, and appropriate.  NEURO: Oriented as arrived to appointment on time with no prompting.   Attestation Statements:   This was prepared with the assistance of Engineer, civil (consulting).  Occasional wrong-word or sound-a-like substitutions may have occurred due to the inherent limitations of voice recognition software.   Crystal Capra, DO

## 2022-08-02 LAB — TSH+T4F+T3FREE
Free T4: 1.38 ng/dL (ref 0.82–1.77)
T3, Free: 2.3 pg/mL (ref 2.0–4.4)
TSH: 3.18 u[IU]/mL (ref 0.450–4.500)

## 2022-08-02 LAB — VITAMIN D 25 HYDROXY (VIT D DEFICIENCY, FRACTURES): Vit D, 25-Hydroxy: 44.1 ng/mL (ref 30.0–100.0)

## 2022-08-02 LAB — COMPREHENSIVE METABOLIC PANEL
ALT: 15 IU/L (ref 0–32)
AST: 16 IU/L (ref 0–40)
Albumin: 4.4 g/dL (ref 3.9–4.9)
Alkaline Phosphatase: 92 IU/L (ref 44–121)
BUN/Creatinine Ratio: 21 (ref 12–28)
BUN: 16 mg/dL (ref 8–27)
Bilirubin Total: 0.4 mg/dL (ref 0.0–1.2)
CO2: 22 mmol/L (ref 20–29)
Calcium: 9.2 mg/dL (ref 8.7–10.3)
Chloride: 102 mmol/L (ref 96–106)
Creatinine, Ser: 0.76 mg/dL (ref 0.57–1.00)
Globulin, Total: 2.4 g/dL (ref 1.5–4.5)
Glucose: 84 mg/dL (ref 70–99)
Potassium: 4.4 mmol/L (ref 3.5–5.2)
Sodium: 140 mmol/L (ref 134–144)
Total Protein: 6.8 g/dL (ref 6.0–8.5)
eGFR: 87 mL/min/{1.73_m2} (ref >59)

## 2022-08-02 LAB — VITAMIN B12: Vitamin B-12: 351 pg/mL (ref 232–1245)

## 2022-08-02 LAB — LIPID PANEL WITH LDL/HDL RATIO
Cholesterol, Total: 241 mg/dL — ABNORMAL HIGH (ref 100–199)
HDL: 83 mg/dL (ref >39)
LDL Chol Calc (NIH): 141 mg/dL — ABNORMAL HIGH (ref 0–99)
LDL/HDL Ratio: 1.7 ratio (ref 0.0–3.2)
Triglycerides: 102 mg/dL (ref 0–149)
VLDL Cholesterol Cal: 17 mg/dL (ref 5–40)

## 2022-08-02 LAB — HEMOGLOBIN A1C
Est. average glucose Bld gHb Est-mCnc: 114 mg/dL
Hgb A1c MFr Bld: 5.6 % (ref 4.8–5.6)

## 2022-08-02 LAB — INSULIN, RANDOM: INSULIN: 9.5 u[IU]/mL (ref 2.6–24.9)

## 2022-08-12 ENCOUNTER — Other Ambulatory Visit (HOSPITAL_COMMUNITY): Payer: Self-pay

## 2022-08-13 ENCOUNTER — Other Ambulatory Visit (HOSPITAL_COMMUNITY): Payer: Self-pay

## 2022-08-29 ENCOUNTER — Ambulatory Visit: Payer: Commercial Managed Care - PPO | Admitting: Bariatrics

## 2022-09-26 ENCOUNTER — Ambulatory Visit: Payer: Commercial Managed Care - PPO | Admitting: Bariatrics

## 2022-09-26 ENCOUNTER — Ambulatory Visit (INDEPENDENT_AMBULATORY_CARE_PROVIDER_SITE_OTHER): Payer: HMO | Admitting: Bariatrics

## 2022-09-26 ENCOUNTER — Encounter: Payer: Self-pay | Admitting: Bariatrics

## 2022-09-26 ENCOUNTER — Other Ambulatory Visit (HOSPITAL_COMMUNITY): Payer: Self-pay

## 2022-09-26 VITALS — BP 147/76 | HR 71 | Temp 98.5°F | Ht 64.0 in | Wt 187.0 lb

## 2022-09-26 DIAGNOSIS — E669 Obesity, unspecified: Secondary | ICD-10-CM

## 2022-09-26 DIAGNOSIS — E1169 Type 2 diabetes mellitus with other specified complication: Secondary | ICD-10-CM

## 2022-09-26 DIAGNOSIS — Z87891 Personal history of nicotine dependence: Secondary | ICD-10-CM | POA: Diagnosis not present

## 2022-09-26 DIAGNOSIS — Z6832 Body mass index (BMI) 32.0-32.9, adult: Secondary | ICD-10-CM | POA: Diagnosis not present

## 2022-09-26 DIAGNOSIS — I10 Essential (primary) hypertension: Secondary | ICD-10-CM | POA: Diagnosis not present

## 2022-09-26 DIAGNOSIS — Z7985 Long-term (current) use of injectable non-insulin antidiabetic drugs: Secondary | ICD-10-CM | POA: Diagnosis not present

## 2022-09-26 MED ORDER — TIRZEPATIDE 15 MG/0.5ML ~~LOC~~ SOAJ
15.0000 mg | SUBCUTANEOUS | 0 refills | Status: DC
Start: 2022-09-26 — End: 2022-11-04
  Filled 2022-09-26: qty 6, 84d supply, fill #0
  Filled 2022-10-16: qty 2, 28d supply, fill #0

## 2022-09-26 NOTE — Progress Notes (Signed)
WEIGHT SUMMARY AND BIOMETRICS  Weight Lost Since Last Visit: 10lb  Vitals Temp: 98.5 F (36.9 C) BP: (!) 147/76 Pulse Rate: 71 SpO2: 99 %   Anthropometric Measurements Height: 5\' 4"  (1.626 m) Weight: 187 lb (84.8 kg) BMI (Calculated): 32.08 Weight at Last Visit: 197lb Weight Lost Since Last Visit: 10lb Weight Gained Since Last Visit: 0 Starting Weight: 241lb Total Weight Loss (lbs): 54 lb (24.5 kg)   Body Composition  Body Fat %: 42.7 % Fat Mass (lbs): 80 lbs Muscle Mass (lbs): 102 lbs Total Body Water (lbs): 77.8 lbs Visceral Fat Rating : 12   Other Clinical Data Fasting: no Labs: no Today's Visit #: 35 Starting Date: 09/10/17    OBESITY Crystal Roberts is here to discuss her progress with her obesity treatment plan along with follow-up of her obesity related diagnoses.     Nutrition Plan: the Category 2 plan - 85-90% adherence.  Current exercise: walking  Interim History:  She is down another 10 lbs since her last visit. She states that her appetite has been low, but not severe.  Eating all of the food on the plan., Protein intake is less than prescribed., Is not skipping meals, Water intake is adequate., and Denies polyphagia  Pharmacotherapy: Crystal Roberts is on Mounjaro 15 mg SQ weekly Adverse side effects: None Hunger is moderately controlled.  Cravings are moderately controlled.  Assessment/Plan:   1. Type 2 diabetes mellitus with obesity (HCC)  HgbA1c is at goal. Last A1c was 5.6 CBGs: Not checking      Episodes of hypoglycemia: no Medication(s): Mounjaro 15 mg SQ weekly  Lab Results  Component Value Date   HGBA1C 5.6 07/31/2022   HGBA1C 5.8 (H) 12/13/2021   HGBA1C 6.0 (H) 11/18/2017   Lab Results  Component Value Date   LDLCALC 141 (H) 07/31/2022   CREATININE 0.76 07/31/2022   No results found for: "GFR"  Plan: Continue and refill Mounjaro 15 mg SQ weekly Continue all other medications.  Will keep all carbohydrates low both sweets  and starches.  Will continue exercise regimen to 30 to 60 minutes on most days of the week.  Aim for 7 to 9 hours of sleep nightly.  Eat more low glycemic index foods.   2. Essential hypertension  Hypertension reasonably well controlled.  Medication(s): Amlodipine 10 mg 1 daily   BP Readings from Last 3 Encounters:  09/26/22 (!) 147/76  07/31/22 (!) 187/92  06/06/22 125/70   Lab Results  Component Value Date   CREATININE 0.76 07/31/2022   CREATININE 0.84 12/13/2021   CREATININE 0.83 01/05/2020    Plan: Continue all antihypertensives at current dosages. No added salt. Will keep sodium content to 1,500 mg or less per day.      Generalized Obesity: Current BMI BMI (Calculated): 32.08   Pharmacotherapy Plan Continue and refill  Mounjaro 15 mg SQ weekly  Crystal Roberts is currently in the action stage of change. As such, her goal is to continue with weight loss efforts.  She has agreed to the Category 2 plan.  Exercise goals: Older adults should determine their level of effort for physical activity relative to their level of fitness.  She is doing yard and other activities.   Behavioral modification strategies: increasing lean protein intake, decreasing simple carbohydrates , no meal skipping, meal planning , better snacking choices, planning for success, increasing vegetables, get rid of junk food in the home, decrease snacking , keep healthy foods in the home, and mindful eating.  Crystal Roberts has agreed to  follow-up with our clinic in 4 weeks.      Objective:   VITALS: Per patient if applicable, see vitals. GENERAL: Alert and in no acute distress. CARDIOPULMONARY: No increased WOB. Speaking in clear sentences.  PSYCH: Pleasant and cooperative. Speech normal rate and rhythm. Affect is appropriate. Insight and judgement are appropriate. Attention is focused, linear, and appropriate.  NEURO: Oriented as arrived to appointment on time with no prompting.   Attestation Statements:   This  was prepared with the assistance of Engineer, civil (consulting).  Occasional wrong-word or sound-a-like substitutions may have occurred due to the inherent limitations of voice recognition software.   Corinna Capra, DO

## 2022-10-16 ENCOUNTER — Other Ambulatory Visit (HOSPITAL_COMMUNITY): Payer: Self-pay

## 2022-10-24 ENCOUNTER — Ambulatory Visit: Payer: Commercial Managed Care - PPO | Admitting: Bariatrics

## 2022-10-31 ENCOUNTER — Other Ambulatory Visit (HOSPITAL_COMMUNITY): Payer: Self-pay | Admitting: Family Medicine

## 2022-10-31 DIAGNOSIS — Z1231 Encounter for screening mammogram for malignant neoplasm of breast: Secondary | ICD-10-CM

## 2022-11-04 ENCOUNTER — Ambulatory Visit (INDEPENDENT_AMBULATORY_CARE_PROVIDER_SITE_OTHER): Payer: HMO | Admitting: Bariatrics

## 2022-11-04 ENCOUNTER — Encounter: Payer: Self-pay | Admitting: Bariatrics

## 2022-11-04 ENCOUNTER — Other Ambulatory Visit (HOSPITAL_COMMUNITY): Payer: Self-pay

## 2022-11-04 DIAGNOSIS — Z7985 Long-term (current) use of injectable non-insulin antidiabetic drugs: Secondary | ICD-10-CM | POA: Diagnosis not present

## 2022-11-04 DIAGNOSIS — Z6831 Body mass index (BMI) 31.0-31.9, adult: Secondary | ICD-10-CM

## 2022-11-04 DIAGNOSIS — E669 Obesity, unspecified: Secondary | ICD-10-CM

## 2022-11-04 DIAGNOSIS — E1169 Type 2 diabetes mellitus with other specified complication: Secondary | ICD-10-CM

## 2022-11-04 DIAGNOSIS — I10 Essential (primary) hypertension: Secondary | ICD-10-CM

## 2022-11-04 MED ORDER — TIRZEPATIDE 15 MG/0.5ML ~~LOC~~ SOAJ
15.0000 mg | SUBCUTANEOUS | 0 refills | Status: DC
Start: 2022-11-04 — End: 2022-12-05
  Filled 2022-11-04 – 2022-11-15 (×4): qty 2, 28d supply, fill #0

## 2022-11-04 NOTE — Progress Notes (Signed)
WEIGHT SUMMARY AND BIOMETRICS  Weight Lost Since Last Visit: 4lb  Weight Gained Since Last Visit: 0   Vitals Temp: 98.7 F (37.1 C) BP: (!) 145/86 Pulse Rate: 69 SpO2: 98 %   Anthropometric Measurements Height: 5\' 4"  (1.626 m) Weight: 183 lb (83 kg) BMI (Calculated): 31.4 Weight at Last Visit: 187lb Weight Lost Since Last Visit: 4lb Weight Gained Since Last Visit: 0 Starting Weight: 241lb Total Weight Loss (lbs): 58 lb (26.3 kg)   Body Composition  Body Fat %: 42.4 % Fat Mass (lbs): 77.6 lbs Muscle Mass (lbs): 100.2 lbs Total Body Water (lbs): 77 lbs Visceral Fat Rating : 12   Other Clinical Data Fasting: no Labs: no Today's Visit #: 22 Starting Date: 09/10/17    OBESITY Crystal Roberts is here to discuss her progress with her obesity treatment plan along with follow-up of her obesity related diagnoses.     Nutrition Plan: the Category 2 plan - 80-90% adherence.  Current exercise: walking  Interim History:  She is down about 58 lbs. She is down 4 lbs since his last visit.  Eating all of the food on the plan., Is not skipping meals, and Water intake is adequate.  Pharmacotherapy: Crystal Roberts is on Zepbound 15 mg SQ weekly and Mounjaro 15 mg SQ weekly Adverse side effects: None Hunger is moderately controlled.  Cravings are moderately controlled.  Assessment/Plan:   1. Type 2 diabetes mellitus with obesity (HCC) Type II Diabetes HgbA1c is at goal. Last A1c was 5.6 CBGs: Not checking      Episodes of hypoglycemia: no Medication(s): Mounjaro 15 mg SQ weekly  Lab Results  Component Value Date   HGBA1C 5.6 07/31/2022   HGBA1C 5.8 (H) 12/13/2021   HGBA1C 6.0 (H) 11/18/2017   Lab Results  Component Value Date   LDLCALC 141 (H) 07/31/2022   CREATININE 0.76 07/31/2022   No results found for: "GFR"  Plan: Continue and refill Mounjaro 15 mg SQ weekly Continue all other medications.  Will keep all carbohydrates low both sweets and starches.  Will  continue exercise regimen to 30 to 60 minutes on most days of the week.  Aim for 7 to 9 hours of sleep nightly.  Eat more low glycemic index foods.   2. Essential hypertension Hypertension Hypertension control uncertain.  Medication(s): Amlodipine 10 mg 1 daily  and Hyzaar 100/25 mg daily  BP Readings from Last 3 Encounters:  11/04/22 (!) 145/86  09/26/22 (!) 147/76  07/31/22 (!) 187/92   Lab Results  Component Value Date   CREATININE 0.76 07/31/2022   CREATININE 0.84 12/13/2021   CREATININE 0.83 01/05/2020   Plan: Continue all antihypertensives at current dosages. No added salt. Will keep sodium content to 1,500 mg or less per day.       Generalized Obesity: Current BMI BMI (Calculated): 31.4   Pharmacotherapy Plan Continue and refill  Mounjaro 15 mg SQ weekly  Crystal Roberts is currently in the action stage of change. As such, her goal is to continue with weight loss efforts.  She has agreed to the Category 2 plan.  Exercise goals: Older adults should determine their level of effort for physical activity relative to their level of fitness.   Behavioral modification strategies: no meal skipping, meal planning , increase water intake, better snacking choices, increasing vegetables, avoiding temptations, and mindful eating.  Crystal Roberts has agreed to follow-up with our clinic in 4 weeks.    Objective:   VITALS: Per patient if applicable, see vitals. GENERAL: Alert and in  no acute distress. CARDIOPULMONARY: No increased WOB. Speaking in clear sentences.  PSYCH: Pleasant and cooperative. Speech normal rate and rhythm. Affect is appropriate. Insight and judgement are appropriate. Attention is focused, linear, and appropriate.  NEURO: Oriented as arrived to appointment on time with no prompting.   Attestation Statements:   This was prepared with the assistance of Engineer, civil (consulting).  Occasional wrong-word or sound-a-like substitutions may have occurred due to the inherent  limitations of voice recognition software.  Corinna Capra, DO

## 2022-11-08 ENCOUNTER — Other Ambulatory Visit: Payer: Self-pay

## 2022-11-08 ENCOUNTER — Encounter (HOSPITAL_COMMUNITY): Payer: Self-pay

## 2022-11-08 ENCOUNTER — Other Ambulatory Visit (HOSPITAL_COMMUNITY): Payer: Self-pay

## 2022-11-08 MED ORDER — LEVOTHYROXINE SODIUM 75 MCG PO TABS
75.0000 ug | ORAL_TABLET | Freq: Every day | ORAL | 0 refills | Status: DC
  Filled 2022-11-08: qty 90, 90d supply, fill #0

## 2022-11-08 MED ORDER — BUPROPION HCL ER (XL) 300 MG PO TB24
300.0000 mg | ORAL_TABLET | Freq: Every day | ORAL | 0 refills | Status: DC
  Filled 2022-11-08 (×2): qty 90, 90d supply, fill #0

## 2022-11-08 MED ORDER — MELOXICAM 15 MG PO TABS
15.0000 mg | ORAL_TABLET | Freq: Every day | ORAL | 0 refills | Status: DC
  Filled 2022-11-08: qty 90, 90d supply, fill #0

## 2022-11-12 ENCOUNTER — Other Ambulatory Visit (HOSPITAL_COMMUNITY): Payer: Self-pay

## 2022-11-14 ENCOUNTER — Telehealth (INDEPENDENT_AMBULATORY_CARE_PROVIDER_SITE_OTHER): Payer: Self-pay

## 2022-11-14 NOTE — Telephone Encounter (Signed)
Crystal Roberts has been approved.  Patient has been notified.

## 2022-11-14 NOTE — Telephone Encounter (Addendum)
Prior Auth has been submitted for [facility] via cover my meds. Awaiting determination.

## 2022-11-15 ENCOUNTER — Other Ambulatory Visit: Payer: Self-pay

## 2022-11-15 ENCOUNTER — Other Ambulatory Visit (HOSPITAL_COMMUNITY): Payer: Self-pay

## 2022-11-19 ENCOUNTER — Other Ambulatory Visit (HOSPITAL_COMMUNITY): Payer: Self-pay

## 2022-11-27 DIAGNOSIS — D3132 Benign neoplasm of left choroid: Secondary | ICD-10-CM | POA: Diagnosis not present

## 2022-11-27 DIAGNOSIS — H43813 Vitreous degeneration, bilateral: Secondary | ICD-10-CM | POA: Diagnosis not present

## 2022-11-27 DIAGNOSIS — H25813 Combined forms of age-related cataract, bilateral: Secondary | ICD-10-CM | POA: Diagnosis not present

## 2022-11-27 DIAGNOSIS — E119 Type 2 diabetes mellitus without complications: Secondary | ICD-10-CM | POA: Diagnosis not present

## 2022-12-02 ENCOUNTER — Encounter (HOSPITAL_COMMUNITY): Payer: Self-pay

## 2022-12-02 ENCOUNTER — Other Ambulatory Visit (HOSPITAL_COMMUNITY): Payer: Self-pay

## 2022-12-02 ENCOUNTER — Other Ambulatory Visit: Payer: Self-pay

## 2022-12-02 ENCOUNTER — Ambulatory Visit (HOSPITAL_COMMUNITY)
Admission: RE | Admit: 2022-12-02 | Discharge: 2022-12-02 | Disposition: A | Discharge status: Home / Self Care | Payer: HMO | Source: Ambulatory Visit | Attending: Family Medicine | Admitting: Family Medicine

## 2022-12-02 DIAGNOSIS — Z1231 Encounter for screening mammogram for malignant neoplasm of breast: Secondary | ICD-10-CM | POA: Diagnosis not present

## 2022-12-02 MED ORDER — AMLODIPINE BESYLATE 10 MG PO TABS
10.0000 mg | ORAL_TABLET | Freq: Every day | ORAL | 1 refills | Status: DC
  Filled 2022-12-02: qty 90, 90d supply, fill #0
  Filled 2023-02-25: qty 90, 90d supply, fill #1

## 2022-12-02 MED ORDER — MELOXICAM 15 MG PO TABS
15.0000 mg | ORAL_TABLET | Freq: Every day | ORAL | 0 refills | Status: DC
  Filled 2022-12-02 – 2023-02-04 (×2): qty 90, 90d supply, fill #0

## 2022-12-03 ENCOUNTER — Other Ambulatory Visit: Payer: Self-pay

## 2022-12-03 DIAGNOSIS — E1159 Type 2 diabetes mellitus with other circulatory complications: Secondary | ICD-10-CM | POA: Diagnosis not present

## 2022-12-03 DIAGNOSIS — Z6830 Body mass index (BMI) 30.0-30.9, adult: Secondary | ICD-10-CM | POA: Diagnosis not present

## 2022-12-03 DIAGNOSIS — E6609 Other obesity due to excess calories: Secondary | ICD-10-CM | POA: Diagnosis not present

## 2022-12-03 DIAGNOSIS — I10 Essential (primary) hypertension: Secondary | ICD-10-CM | POA: Diagnosis not present

## 2022-12-05 ENCOUNTER — Ambulatory Visit: Payer: HMO | Admitting: Bariatrics

## 2022-12-05 ENCOUNTER — Encounter: Payer: Self-pay | Admitting: Bariatrics

## 2022-12-05 ENCOUNTER — Other Ambulatory Visit (HOSPITAL_COMMUNITY): Payer: Self-pay

## 2022-12-05 VITALS — BP 118/60 | HR 75 | Temp 98.2°F | Ht 64.0 in | Wt 176.0 lb

## 2022-12-05 DIAGNOSIS — I10 Essential (primary) hypertension: Secondary | ICD-10-CM

## 2022-12-05 DIAGNOSIS — E1169 Type 2 diabetes mellitus with other specified complication: Secondary | ICD-10-CM

## 2022-12-05 DIAGNOSIS — Z6830 Body mass index (BMI) 30.0-30.9, adult: Secondary | ICD-10-CM | POA: Diagnosis not present

## 2022-12-05 DIAGNOSIS — Z7985 Long-term (current) use of injectable non-insulin antidiabetic drugs: Secondary | ICD-10-CM

## 2022-12-05 DIAGNOSIS — E669 Obesity, unspecified: Secondary | ICD-10-CM | POA: Diagnosis not present

## 2022-12-05 MED ORDER — TIRZEPATIDE 15 MG/0.5ML ~~LOC~~ SOAJ
15.0000 mg | SUBCUTANEOUS | 0 refills | Status: DC
  Filled 2022-12-05 – 2022-12-06 (×2): qty 2, 28d supply, fill #0

## 2022-12-05 NOTE — Progress Notes (Signed)
WEIGHT SUMMARY AND BIOMETRICS  Weight Lost Since Last Visit: 7 lb  Weight Gained Since Last Visit: 0   Vitals Temp: 98.2 F (36.8 C) BP: 118/60 Pulse Rate: 75 SpO2: 100 %   Anthropometric Measurements Height: 5\' 4"  (1.626 m) Weight: 176 lb (79.8 kg) BMI (Calculated): 30.2 Weight at Last Visit: 183 lb Weight Lost Since Last Visit: 7 lb Weight Gained Since Last Visit: 0 Starting Weight: 241 lb Total Weight Loss (lbs): 65 lb (29.5 kg)   Body Composition  Body Fat %: 39.9 % Fat Mass (lbs): 70.4 lbs Muscle Mass (lbs): 100.8 lbs Total Body Water (lbs): 75.6 lbs Visceral Fat Rating : 11   Other Clinical Data Today's Visit #: 42 Starting Date: 09/10/17    OBESITY Crystal Roberts to discuss her progress with her obesity treatment plan along with follow-up of her obesity related diagnoses.    Nutrition Plan: the Category 2 plan - 80-85% adherence.  Current exercise: walking 30-45 minutes x 3  Interim History:  She is down 7 lbs since her last visit.  Eating all of the food on the plan., Is not skipping meals, Water intake is adequate., and Denies polyphagia   Pharmacotherapy: Crystal Roberts is on Crystal Roberts 15 mg SQ weekly Adverse side effects: None Hunger is moderately controlled.  Cravings are moderately controlled.  Assessment/Plan:   Type II Diabetes HgbA1c is at goal. Last A1c was 5.6 CBGs: Not checking      Episodes of hypoglycemia: no Medication(s): Crystal Roberts 15 mg SQ weekly  Lab Results  Component Value Date   HGBA1C 5.6 07/31/2022   HGBA1C 5.8 (H) 12/13/2021   HGBA1C 6.0 (H) 11/18/2017   Lab Results  Component Value Date   LDLCALC 141 (H) 07/31/2022   CREATININE 0.76 07/31/2022   No results found for: "GFR"  Plan: Continue and refill Crystal Roberts 15 mg SQ weekly Will keep all carbohydrates low both sweets and starches.  Will continue  exercise regimen to 30 to 60 minutes on most days of the week.  Aim for 7 to 9 hours of sleep nightly.  Eat more low glycemic index foods.   Hypertension Hypertension stable.  Medication(s): Amlodipine 10 mg 1 daily , and Hyzaar 100-25 mg daily.   BP Readings from Last 3 Encounters:  12/05/22 118/60  11/04/22 (!) 145/86  09/26/22 (!) 147/76   Lab Results  Component Value Date   CREATININE 0.76 07/31/2022   CREATININE 0.84 12/13/2021   CREATININE 0.83 01/05/2020    Plan: Continue all antihypertensives at current dosages. No added salt. Will keep sodium content to 1,500 mg or less per day.     Generalized Obesity: Current BMI BMI (Calculated): 30.2   Pharmacotherapy Plan Continue and refill  Crystal Roberts 15 mg SQ weekly  Crystal Roberts is currently in the action stage of change. As such, her goal is to continue with weight loss efforts.  She has agreed to the Category 2 plan.  Exercise goals: Older adults should follow the adult guidelines. When older adults cannot meet the adult guidelines, they should be as physically active as their abilities and conditions will allow.   Behavioral modification strategies: increasing lean protein intake, no meal skipping, meal planning , increase water intake, better snacking choices, planning for success, decrease snacking , keep healthy foods in the home, and mindful eating.  Crystal Roberts has agreed to follow-up with our clinic in 4 weeks.    Medications Discontinued During This Encounter  Medication Reason   linaclotide (LINZESS) 145 MCG CAPS capsule No longer needed (for PRN medications)       Objective:   VITALS: Per patient if applicable, see vitals. GENERAL: Alert and in no acute distress. CARDIOPULMONARY: No increased WOB. Speaking in clear sentences.  PSYCH: Pleasant and cooperative. Speech normal rate and rhythm. Affect is appropriate. Insight and judgement are appropriate. Attention is focused, linear, and appropriate.  NEURO: Oriented as  arrived to appointment on time with no prompting.   Attestation Statements:   This was prepared with the assistance of Engineer, civil (consulting).  Occasional wrong-word or sound-a-like substitutions may have occurred due to the inherent limitations of voice recognition    Corinna Capra, DO

## 2022-12-06 ENCOUNTER — Other Ambulatory Visit (HOSPITAL_COMMUNITY): Payer: Self-pay

## 2022-12-25 ENCOUNTER — Encounter: Payer: Self-pay | Admitting: Internal Medicine

## 2023-01-06 ENCOUNTER — Other Ambulatory Visit: Payer: Self-pay | Admitting: Bariatrics

## 2023-01-06 DIAGNOSIS — I10 Essential (primary) hypertension: Secondary | ICD-10-CM

## 2023-01-06 DIAGNOSIS — E669 Obesity, unspecified: Secondary | ICD-10-CM

## 2023-01-07 ENCOUNTER — Other Ambulatory Visit (HOSPITAL_COMMUNITY): Payer: Self-pay

## 2023-01-07 ENCOUNTER — Encounter (HOSPITAL_COMMUNITY): Payer: Self-pay

## 2023-01-09 ENCOUNTER — Encounter: Payer: Self-pay | Admitting: Bariatrics

## 2023-01-09 ENCOUNTER — Other Ambulatory Visit: Payer: Self-pay | Admitting: Bariatrics

## 2023-01-09 ENCOUNTER — Other Ambulatory Visit (HOSPITAL_COMMUNITY): Payer: Self-pay

## 2023-01-09 DIAGNOSIS — I10 Essential (primary) hypertension: Secondary | ICD-10-CM

## 2023-01-09 DIAGNOSIS — E669 Obesity, unspecified: Secondary | ICD-10-CM

## 2023-01-09 MED ORDER — TIRZEPATIDE 15 MG/0.5ML ~~LOC~~ SOAJ
15.0000 mg | SUBCUTANEOUS | 0 refills | Status: DC
  Filled 2023-01-09: qty 2, 28d supply, fill #0

## 2023-01-09 NOTE — Telephone Encounter (Signed)
Patient is needing a refill. Can you please advise? Was last seen on 12/05/22 and has a follow-up on 02/13/23.

## 2023-01-31 ENCOUNTER — Ambulatory Visit (AMBULATORY_SURGERY_CENTER): Payer: HMO | Admitting: *Deleted

## 2023-01-31 ENCOUNTER — Other Ambulatory Visit: Payer: Self-pay | Admitting: Orthopaedic Surgery

## 2023-01-31 ENCOUNTER — Other Ambulatory Visit (HOSPITAL_COMMUNITY): Payer: Self-pay

## 2023-01-31 VITALS — Ht 64.0 in | Wt 175.0 lb

## 2023-01-31 DIAGNOSIS — M75102 Unspecified rotator cuff tear or rupture of left shoulder, not specified as traumatic: Secondary | ICD-10-CM

## 2023-01-31 DIAGNOSIS — M25512 Pain in left shoulder: Secondary | ICD-10-CM | POA: Diagnosis not present

## 2023-01-31 DIAGNOSIS — Z1211 Encounter for screening for malignant neoplasm of colon: Secondary | ICD-10-CM

## 2023-01-31 MED ORDER — NA SULFATE-K SULFATE-MG SULF 17.5-3.13-1.6 GM/177ML PO SOLN
1.0000 | Freq: Once | ORAL | 0 refills | Status: AC
  Filled 2023-01-31: qty 354, 1d supply, fill #0

## 2023-01-31 NOTE — Progress Notes (Signed)
Pt's name and DOB verified at the beginning of the pre-visit wit 2 identifiers  Pt denies any difficulty with ambulating,sitting, laying down or rolling side to side  Pt has no issues with ambulation   Pt has no issues moving head neck or swallowing  No egg or soy allergy known to patient   No issues known to pt with past sedation with any surgeries or procedures  Pt denies having issues being intubated  No FH of Malignant Hyperthermia  Pt is not on diet pills or shots  Pt is not on home 02   Pt is not on blood thinners   Pt has frequent issues with constipation RN instructed pt to use Miralax per bottles instructions a week before prep days. Pt states they will  Pt is not on dialysis  Pt denise any abnormal heart rhythms   Pt denies any upcoming cardiac testing  Pt encouraged to use to use Singlecare or Goodrx to reduce cost   Patient's chart reviewed by Crystal Roberts CNRA prior to pre-visit and patient appropriate for the [facility].  Pre-visit completed and red dot placed by patient's name on their procedure day (on provider's schedule).  .  Visit in person  Pt states weight is 175 lb  Instructed pt why it is important to and  to call if they have any changes in health or new medications. Directed them to the # given and on instructions.     Instructions reviewed. Pt given both [facility] main # and MD on call # prior to instructions.  Pt states understanding. Instructed to review again prior to procedure. Pt states they will.   Instructions sent by mail with coupon and by My Chart  Coupon sent via text to mobile phone and pt verified they received it

## 2023-02-04 ENCOUNTER — Other Ambulatory Visit (HOSPITAL_COMMUNITY): Payer: Self-pay

## 2023-02-04 ENCOUNTER — Other Ambulatory Visit: Payer: Self-pay

## 2023-02-04 MED ORDER — LEVOTHYROXINE SODIUM 75 MCG PO TABS
75.0000 ug | ORAL_TABLET | Freq: Every day | ORAL | 0 refills | Status: DC
  Filled 2023-02-04: qty 90, 90d supply, fill #0

## 2023-02-04 MED ORDER — FLUOXETINE HCL 20 MG PO CAPS
20.0000 mg | ORAL_CAPSULE | Freq: Every morning | ORAL | 0 refills | Status: DC
  Filled 2023-02-04: qty 90, 90d supply, fill #0

## 2023-02-04 MED ORDER — BUPROPION HCL ER (XL) 300 MG PO TB24
300.0000 mg | ORAL_TABLET | Freq: Every day | ORAL | 0 refills | Status: DC
  Filled 2023-02-04: qty 90, 90d supply, fill #0

## 2023-02-06 ENCOUNTER — Ambulatory Visit
Admission: RE | Admit: 2023-02-06 | Discharge: 2023-02-06 | Disposition: A | Discharge status: Home / Self Care | Payer: HMO | Source: Ambulatory Visit | Attending: Orthopaedic Surgery | Admitting: Orthopaedic Surgery

## 2023-02-06 DIAGNOSIS — S46012A Strain of muscle(s) and tendon(s) of the rotator cuff of left shoulder, initial encounter: Secondary | ICD-10-CM | POA: Diagnosis not present

## 2023-02-06 DIAGNOSIS — M19012 Primary osteoarthritis, left shoulder: Secondary | ICD-10-CM | POA: Diagnosis not present

## 2023-02-06 DIAGNOSIS — M75102 Unspecified rotator cuff tear or rupture of left shoulder, not specified as traumatic: Secondary | ICD-10-CM

## 2023-02-10 ENCOUNTER — Encounter: Payer: Self-pay | Admitting: Internal Medicine

## 2023-02-13 ENCOUNTER — Ambulatory Visit (INDEPENDENT_AMBULATORY_CARE_PROVIDER_SITE_OTHER): Payer: HMO | Admitting: Bariatrics

## 2023-02-13 ENCOUNTER — Other Ambulatory Visit (HOSPITAL_COMMUNITY): Payer: Self-pay

## 2023-02-13 ENCOUNTER — Encounter: Payer: Self-pay | Admitting: Bariatrics

## 2023-02-13 VITALS — BP 109/68 | HR 69 | Temp 98.2°F | Ht 64.0 in | Wt 172.0 lb

## 2023-02-13 DIAGNOSIS — E669 Obesity, unspecified: Secondary | ICD-10-CM | POA: Diagnosis not present

## 2023-02-13 DIAGNOSIS — Z7985 Long-term (current) use of injectable non-insulin antidiabetic drugs: Secondary | ICD-10-CM | POA: Diagnosis not present

## 2023-02-13 DIAGNOSIS — E119 Type 2 diabetes mellitus without complications: Secondary | ICD-10-CM

## 2023-02-13 DIAGNOSIS — E66811 Obesity, class 1: Secondary | ICD-10-CM

## 2023-02-13 DIAGNOSIS — Z6829 Body mass index (BMI) 29.0-29.9, adult: Secondary | ICD-10-CM

## 2023-02-13 DIAGNOSIS — I10 Essential (primary) hypertension: Secondary | ICD-10-CM | POA: Diagnosis not present

## 2023-02-13 DIAGNOSIS — E1169 Type 2 diabetes mellitus with other specified complication: Secondary | ICD-10-CM

## 2023-02-13 MED ORDER — TIRZEPATIDE 15 MG/0.5ML ~~LOC~~ SOAJ
15.0000 mg | SUBCUTANEOUS | 0 refills | Status: DC
  Filled 2023-02-13: qty 2, 28d supply, fill #0

## 2023-02-13 NOTE — Progress Notes (Signed)
WEIGHT SUMMARY AND BIOMETRICS  Weight Lost Since Last Visit: 4lb  Weight Gained Since Last Visit: 0   Vitals Temp: 98.2 F (36.8 C) BP: 109/68 Pulse Rate: 69 SpO2: 98 %   Anthropometric Measurements Height: 5\' 4"  (1.626 m) Weight: 172 lb (78 kg) BMI (Calculated): 29.51 Weight at Last Visit: 176lb Weight Lost Since Last Visit: 4lb Weight Gained Since Last Visit: 0 Starting Weight: 241lb Total Weight Loss (lbs): 69 lb (31.3 kg)   Body Composition  Body Fat %: 40.3 % Fat Mass (lbs): 69.6 lbs Muscle Mass (lbs): 98 lbs Total Body Water (lbs): 73.8 lbs Visceral Fat Rating : 11   Other Clinical Data Fasting: no Labs: no Today's Visit #: 16 Starting Date: 09/10/17    OBESITY Crystal Roberts is here to discuss her progress with her obesity treatment plan along with follow-up of her obesity related diagnoses.    Nutrition Plan: the Category 2 plan - 95% adherence.  Current exercise: walking  Interim History:  She is down 4 lbs since her last visit.  Eating all of the food on the plan., Is not skipping meals, and Water intake is adequate.   Pharmacotherapy: Crystal Roberts is on Mounjaro 15 mg SQ weekly Adverse side effects: None Hunger is well controlled.  Cravings are well controlled.  Assessment/Plan:   Type II Diabetes HgbA1c is at goal. Last A1c was 5.6 CBGs: Fasting 100 to 110        Episodes of hypoglycemia: no Medication(s): Mounjaro 15 mg SQ weekly  Lab Results  Component Value Date   HGBA1C 5.6 07/31/2022   HGBA1C 5.8 (H) 12/13/2021   HGBA1C 6.0 (H) 11/18/2017   Lab Results  Component Value Date   LDLCALC 141 (H) 07/31/2022   CREATININE 0.76 07/31/2022   No results found for: "GFR"  Plan: Continue and refill Mounjaro 15 mg SQ weekly Continue all other medications.  Will keep all carbohydrates low both sweets and starches.  Will continue  exercise regimen to 30 to 60 minutes on most days of the week.  She will continue to cook more of her meals at home. She will keep her fiber, protein, and water intakes high. Eat more low glycemic index foods.   Hypertension Hypertension stable.  Medication(s): Amlodipine 10 mg 1 daily  Hyzaar 100-25 mg 1 daily.   BP Readings from Last 3 Encounters:  02/13/23 109/68  12/05/22 118/60  11/04/22 (!) 145/86   Lab Results  Component Value Date   CREATININE 0.76 07/31/2022   CREATININE 0.84 12/13/2021   CREATININE 0.83 01/05/2020   No results found for: "GFR"  Plan: Continue all antihypertensives at current dosages. No added salt. Will keep sodium content to 1,500 mg or less per day.      Generalized Obesity: Current BMI BMI (Calculated): 29.51   Pharmacotherapy Plan Continue and refill  Mounjaro 15 mg SQ weekly  Crystal Roberts  is currently in the action stage of change. As such, her goal is to continue with weight loss efforts.  She has agreed to the Category 2 plan.  Exercise goals: Older adults should do exercises that maintain or improve balance if they are at risk of falling.   Behavioral modification strategies: increasing lean protein intake, meal planning , increase water intake, better snacking choices, planning for success, avoiding temptations, and keep healthy foods in the home.  Crystal Roberts has agreed to follow-up with our clinic in 4 weeks.       Objective:   VITALS: Per patient if applicable, see vitals. GENERAL: Alert and in no acute distress. CARDIOPULMONARY: No increased WOB. Speaking in clear sentences.  PSYCH: Pleasant and cooperative. Speech normal rate and rhythm. Affect is appropriate. Insight and judgement are appropriate. Attention is focused, linear, and appropriate.  NEURO: Oriented as arrived to appointment on time with no prompting.   Attestation Statements:    This was prepared with the assistance of Engineer, civil (consulting).  Occasional wrong-word or  sound-a-like substitutions may have occurred due to the inherent limitations of voice recognition   Corinna Capra, DO

## 2023-02-14 ENCOUNTER — Encounter: Payer: Self-pay | Admitting: Internal Medicine

## 2023-02-14 ENCOUNTER — Ambulatory Visit: Payer: HMO | Admitting: Internal Medicine

## 2023-02-14 VITALS — BP 114/60 | HR 62 | Temp 98.0°F | Resp 11 | Ht 64.0 in | Wt 175.0 lb

## 2023-02-14 DIAGNOSIS — K648 Other hemorrhoids: Secondary | ICD-10-CM

## 2023-02-14 DIAGNOSIS — I10 Essential (primary) hypertension: Secondary | ICD-10-CM | POA: Diagnosis not present

## 2023-02-14 DIAGNOSIS — D122 Benign neoplasm of ascending colon: Secondary | ICD-10-CM

## 2023-02-14 DIAGNOSIS — E119 Type 2 diabetes mellitus without complications: Secondary | ICD-10-CM | POA: Diagnosis not present

## 2023-02-14 DIAGNOSIS — D123 Benign neoplasm of transverse colon: Secondary | ICD-10-CM

## 2023-02-14 DIAGNOSIS — D120 Benign neoplasm of cecum: Secondary | ICD-10-CM

## 2023-02-14 DIAGNOSIS — E039 Hypothyroidism, unspecified: Secondary | ICD-10-CM | POA: Diagnosis not present

## 2023-02-14 DIAGNOSIS — Z1211 Encounter for screening for malignant neoplasm of colon: Secondary | ICD-10-CM | POA: Diagnosis not present

## 2023-02-14 DIAGNOSIS — F32A Depression, unspecified: Secondary | ICD-10-CM | POA: Diagnosis not present

## 2023-02-14 DIAGNOSIS — F419 Anxiety disorder, unspecified: Secondary | ICD-10-CM | POA: Diagnosis not present

## 2023-02-14 MED ORDER — SODIUM CHLORIDE 0.9 % IV SOLN: 500.0000 mL | INTRAVENOUS | Status: DC

## 2023-02-14 NOTE — Progress Notes (Signed)
 Pt's states no medical or surgical changes since previsit or office visit.

## 2023-02-14 NOTE — Progress Notes (Signed)
 Report to PACU, RN, vss, BBS= Clear.

## 2023-02-14 NOTE — Op Note (Signed)
[facility] Patient Name: Crystal Roberts Procedure Date: 02/14/2023 1:58 PM MRN: [id number] Endoscopist: Madelyn Brunner [place] , , [id number] Age: 68 Referring MD:  Date of Birth: 1955/10/01 Gender: Female Account #: [id number] Procedure:                Colonoscopy Indications:              Screening for colorectal malignant neoplasm Medicines:                Monitored Anesthesia Care Procedure:                Pre-Anesthesia Assessment:                           - Prior to the procedure, a History and Physical                            was performed, and patient medications and                            allergies were reviewed. The patient's tolerance of                            previous anesthesia was also reviewed. The risks                            and benefits of the procedure and the sedation                            options and risks were discussed with the patient.                            All questions were answered, and informed consent                            was obtained. Prior Anticoagulants: The patient has                            taken no anticoagulant or antiplatelet agents. ASA                            Grade Assessment: II - A patient with mild systemic                            disease. After reviewing the risks and benefits,                            the patient was deemed in satisfactory condition to                            undergo the procedure.                           After obtaining informed consent, the colonoscope  was passed under direct vision. Throughout the                            procedure, the patient's blood pressure, pulse, and                            oxygen saturations were monitored continuously. The                            Olympus Scope SN [id number] was introduced through the                            anus and advanced to the the terminal ileum. The                             colonoscopy was performed without difficulty. The                            patient tolerated the procedure well. The quality                            of the bowel preparation was good. The terminal                            ileum, ileocecal valve, appendiceal orifice, and                            rectum were photographed. Scope In: 2:02:14 PM Scope Out: 2:24:20 PM Scope Withdrawal Time: 0 hours 15 minutes 2 seconds  Total Procedure Duration: 0 hours 22 minutes 6 seconds  Findings:                 The terminal ileum appeared normal.                           Four sessile polyps were found in the transverse                            colon, ascending colon and cecum. The polyps were 3                            to 12 mm in size. These polyps were removed with a                            cold snare. Resection and retrieval were complete.                           Non-bleeding internal hemorrhoids were found during                            retroflexion. Complications:            No immediate complications. Estimated Blood Loss:     Estimated blood loss was minimal. Impression:               -  The examined portion of the ileum was normal.                           - Four 3 to 12 mm polyps in the transverse colon,                            in the ascending colon and in the cecum, removed                            with a cold snare. Resected and retrieved.                           - Non-bleeding internal hemorrhoids. Recommendation:           - Discharge patient to home (with escort).                           - Await pathology results.                           - The findings and recommendations were discussed                            with the patient. Dr Particia Lather [address] [place],  02/14/2023 2:28:21 PM

## 2023-02-14 NOTE — Progress Notes (Signed)
GASTROENTEROLOGY PROCEDURE H&P NOTE   Primary Care Physician: Assunta Found, MD    Reason for Procedure:   Colon cancer screening  Plan:    Colonoscopy  Patient is appropriate for endoscopic procedure(s) in the ambulatory ([facility]) setting.  The nature of the procedure, as well as the risks, benefits, and alternatives were carefully and thoroughly reviewed with the patient. Ample time for discussion and questions allowed. The patient understood, was satisfied, and agreed to proceed.     HPI: Crystal Roberts is a 68 y.o. female who presents for colonoscopy for colon cancer screening. Denies blood in stools, changes in bowel habits, or unintentional weight loss. Denies family history of colon cancer. Last colonoscopy was 15 years ago and was normal.  Past Medical History:  Diagnosis Date   Anxiety    Arthritis    neck- cervical disc herniation    Asthma    as child   Back pain    Cancer (HCC)    skin - low back , insitu  melanoma    Constipation    COVID-19 09/07/2019   sx fever/cough/chills/lost taste and smell   Depression    Diabetes mellitus without complication (HCC)    Dyspnea    Gallbladder problem    GERD (gastroesophageal reflux disease)    H/O cardiovascular stress test 1990   pt. reports that she was having a lot of stress & she was seen by cardiac- had stress test & told that it was negative    Hyperlipidemia    Hypertension    Hypothyroidism    Leg edema    Pulmonary embolism (HCC) 1986   Hosp. at [facility]- on blood thinning med., thought to be related to lack of activity, smoking & BCP    Past Surgical History:  Procedure Laterality Date   ANTERIOR CERVICAL DECOMP/DISCECTOMY FUSION N/A 08/16/2016   Procedure: Cervical Four-Five Anterior cervical decompression/discectomy/fusion;  Surgeon: Maeola Harman, MD;  Location: [facility] OR;  Service: Neurosurgery;  Laterality: N/A;  C4-5 Anterior cervical decompression/discectomy/fusion   BACK SURGERY     sebaceous cyst  removed   CHOLECYSTECTOMY  1987   umbilical hernia ------ followed cholecystectomy   Cyst removed from back     CYSTECTOMY Bilateral    cyst on ovaries   HERNIA REPAIR     umbilical hernia repair    MASS EXCISION N/A 11/21/2017   Procedure: EXCISION SEBACEOUS CYST ON BACK;  Surgeon: Franky Macho, MD;  Location: [facility];  Service: General;  Laterality: N/A;   SHOULDER ARTHROSCOPY WITH ROTATOR CUFF REPAIR AND SUBACROMIAL DECOMPRESSION Right 09/23/2019   Procedure: RIGHT SHOULDER ARTHROSCOPY DEBRIDEMENT, ACROMIOPLASTY, DISTAL CLAVICAL EXCISION ROTATOR CUFF REPAIR;  Surgeon: Bjorn Pippin, MD;  Location: [facility];  Service: Orthopedics;  Laterality: Right;   TUBAL LIGATION      Prior to Admission medications   Medication Sig Start Date End Date Taking? Authorizing Provider  amLODipine (NORVASC) 10 MG tablet Take 1 tablet (10 mg total) by mouth at bedtime. 12/02/22     aspirin 81 MG tablet Take 81 mg by mouth daily.    [provider]  buPROPion (WELLBUTRIN XL) 300 MG 24 hr tablet Take 1 tablet (300 mg total) by mouth daily. 02/04/23     Cholecalciferol (VITAMIN D) 50 MCG (2000 UT) CAPS Take by mouth. Patient not taking: Reported on 02/13/2023    [provider]  CINNAMON PO Take 500 mg by mouth at bedtime.  Patient not taking: Reported on 01/31/2023  [provider]  cyclobenzaprine (FLEXERIL) 10 MG tablet Take 10 mg by mouth 3 (three) times daily as needed. 03/22/21   [provider]  diazepam (VALIUM) 2 MG tablet Take 2 mg by mouth 2 (two) times daily. 06/25/19   [provider]  FLUoxetine (PROZAC) 20 MG capsule Take 1 capsule (20 mg total) by mouth every morning. 02/04/23     furosemide (LASIX) 40 MG tablet Take 40 mg by mouth daily. 03/15/19   [provider]  levothyroxine (SYNTHROID) 75 MCG tablet Take 1 tablet (75 mcg total) by mouth daily. 02/04/23     losartan-hydrochlorothiazide (HYZAAR) 100-25 MG tablet Take 1 tablet by  mouth daily. 07/30/22     meloxicam (MOBIC) 15 MG tablet Take 1 tablet (15 mg total) by mouth daily. 12/02/22     Multiple Vitamin (MULTIVITAMIN) tablet Take 1 tablet by mouth daily.    [provider]  Polyethyl Glycol-Propyl Glycol 0.4-0.3 % SOLN Place 1 drop into both eyes 3 (three) times daily as needed (for itchy/dry eyes.).    [provider]  rosuvastatin (CRESTOR) 20 MG tablet Take 1 tablet (20 mg total) by mouth daily. 05/10/22     tirzepatide (MOUNJARO) 15 MG/0.5ML Pen Inject 15 mg into the skin once a week. 02/13/23   Corinna Capra A, DO  simvastatin (ZOCOR) 40 MG tablet Take 1 tablet (40 mg total) by mouth at bedtime. 02/20/21 02/21/21      Current Outpatient Medications  Medication Sig Dispense Refill   amLODipine (NORVASC) 10 MG tablet Take 1 tablet (10 mg total) by mouth at bedtime. 90 tablet 1   aspirin 81 MG tablet Take 81 mg by mouth daily.     buPROPion (WELLBUTRIN XL) 300 MG 24 hr tablet Take 1 tablet (300 mg total) by mouth daily. 90 tablet 0   Cholecalciferol (VITAMIN D) 50 MCG (2000 UT) CAPS Take by mouth.     diazepam (VALIUM) 2 MG tablet Take 2 mg by mouth 2 (two) times daily.     FLUoxetine (PROZAC) 20 MG capsule Take 1 capsule (20 mg total) by mouth every morning. 90 capsule 0   levothyroxine (SYNTHROID) 75 MCG tablet Take 1 tablet (75 mcg total) by mouth daily. 90 tablet 0   losartan-hydrochlorothiazide (HYZAAR) 100-25 MG tablet Take 1 tablet by mouth daily. 90 tablet 1   Multiple Vitamin (MULTIVITAMIN) tablet Take 1 tablet by mouth daily.     rosuvastatin (CRESTOR) 20 MG tablet Take 1 tablet (20 mg total) by mouth daily. 90 tablet 1   CINNAMON PO Take 500 mg by mouth at bedtime.  (Patient not taking: Reported on 01/31/2023)     cyclobenzaprine (FLEXERIL) 10 MG tablet Take 10 mg by mouth 3 (three) times daily as needed.     furosemide (LASIX) 40 MG tablet Take 40 mg by mouth daily.     meloxicam (MOBIC) 15 MG tablet Take 1 tablet (15 mg total) by mouth  daily. 90 tablet 0   Polyethyl Glycol-Propyl Glycol 0.4-0.3 % SOLN Place 1 drop into both eyes 3 (three) times daily as needed (for itchy/dry eyes.).     tirzepatide (MOUNJARO) 15 MG/0.5ML Pen Inject 15 mg into the skin once a week. 2 mL 0   Current Facility-Administered Medications  Medication Dose Route Frequency Provider Last Rate Last Admin   0.9 %  sodium chloride infusion  500 mL Intravenous Continuous Imogene Burn, MD        Allergies as of 02/14/2023 - Review Complete 02/14/2023  Allergen Reaction Noted   Diclofenac Itching 10/28/2019   Sulfa antibiotics Itching and Rash 01/20/2013    Family History  Problem Relation Age of Onset   Heart disease Mother    Hypertension Mother    Hyperlipidemia Mother    Cancer Father        melenoma   Diabetes Sister    Diabetes Sister    Cancer Sister        kidney; had kidney removed   Stroke Brother    Heart disease Brother    Breast cancer Maternal Aunt    Diabetes Paternal Grandmother    Colon polyps Cousin    Colon cancer Neg Hx    Esophageal cancer Neg Hx    Rectal cancer Neg Hx    Stomach cancer Neg Hx     Social History   Socioeconomic History   Marital status: Widowed    Spouse name: Chrissie Noa "Peyton Najjar" [place]   Number of children: Not on file   Years of education: Not on file   Highest education level: Not on file  Occupational History   Occupation: [employer]  Tobacco Use   Smoking status: Former    Current packs/day: 0.00    Average packs/day: 1 pack/day for 10.0 years (10.0 ttl pk-yrs)    Types: Cigarettes    Start date: 08/13/1976    Quit date: 08/14/1986    Years since quitting: 36.5   Smokeless tobacco: Never  Vaping Use   Vaping status: Never Used  Substance and Sexual Activity   Alcohol use: Yes    Comment: very rare    Drug use: No   Sexual activity: Not Currently    Birth control/protection: Post-menopausal, Surgical    Comment: tubal  Other Topics Concern   Not on file  Social History Narrative    Not on file   Social Drivers of Health   Financial Resource Strain: Low Risk  (06/05/2022)   Overall Financial Resource Strain (CARDIA)    Difficulty of Paying Living Expenses: Not hard at all  Food Insecurity: No Food Insecurity (06/05/2022)   Hunger Vital Sign    Worried About Running Out of Food in the Last Year: Never true    Ran Out of Food in the Last Year: Never true  Transportation Needs: No Transportation Needs (06/05/2022)   PRAPARE - Administrator, Civil Service (Medical): No    Lack of Transportation (Non-Medical): No  Physical Activity: Insufficiently Active (06/05/2022)   Exercise Vital Sign    Days of Exercise per Week: 3 days    Minutes of Exercise per Session: 30 min  Stress: No Stress Concern Present (06/05/2022)   [employer] of Occupational Health - Occupational Stress Questionnaire    Feeling of Stress : Only a little  Social Connections: Moderately Isolated (06/05/2022)   Social Connection and Isolation Panel [NHANES]    Frequency of Communication with Friends and Family: More than three times a week    Frequency of Social Gatherings with Friends and Family: Once a week    Attends Religious Services: 1 to 4 times per year    Active Member of [employer] or Organizations: No    Attends Banker Meetings: Never    Marital Status: Widowed  Intimate Partner Violence: Not At Risk (06/05/2022)   Humiliation, Afraid, Rape, and Kick questionnaire    Fear of Current or Ex-Partner: No    Emotionally Abused: No    Physically Abused: No    Sexually Abused: No  Physical Exam: Vital signs in last 24 hours: BP 123/62   Pulse 69   Temp 98 F (36.7 C)   Ht 5\' 4"  (1.626 m)   Wt 175 lb (79.4 kg)   SpO2 98%   BMI 30.04 kg/m  GEN: NAD EYE: Sclerae anicteric ENT: MMM CV: Non-tachycardic Pulm: No increased work of breathing GI: Soft, NT/ND NEURO:  Alert & Oriented   Eulah Pont, MD [facility]  02/14/2023 1:52 PM

## 2023-02-14 NOTE — Patient Instructions (Signed)
 Resume previous diet Continue present medications Await pathology results Handouts/information given for polyps and hemorrhoids  YOU HAD AN ENDOSCOPIC PROCEDURE TODAY AT THE [facility]:   Refer to the procedure report that was given to you for any specific questions about what was found during the examination.  If the procedure report does not answer your questions, please call your gastroenterologist to clarify.  If you requested that your care partner not be given the details of your procedure findings, then the procedure report has been included in a sealed envelope for you to review at your convenience later.  YOU SHOULD EXPECT: Some feelings of bloating in the abdomen. Passage of more gas than usual.  Walking can help get rid of the air that was put into your GI tract during the procedure and reduce the bloating. If you had a lower endoscopy (such as a colonoscopy or flexible sigmoidoscopy) you may notice spotting of blood in your stool or on the toilet paper. If you underwent a bowel prep for your procedure, you may not have a normal bowel movement for a few days.  Please Note:  You might notice some irritation and congestion in your nose or some drainage.  This is from the oxygen used during your procedure.  There is no need for concern and it should clear up in a day or so.  SYMPTOMS TO REPORT IMMEDIATELY:  Following lower endoscopy (colonoscopy):  Excessive amounts of blood in the stool  Significant tenderness or worsening of abdominal pains  Swelling of the abdomen that is new, acute  Fever of 100F or higher  For urgent or emergent issues, a gastroenterologist can be reached at any hour by calling [phone]. Do not use MyChart messaging for urgent concerns.   DIET:  We do recommend a small meal at first, but then you may proceed to your regular diet.  Drink plenty of fluids but you should avoid alcoholic beverages for 24 hours.  ACTIVITY:  You should plan to take  it easy for the rest of today and you should NOT DRIVE or use heavy machinery until tomorrow (because of the sedation medicines used during the test).    FOLLOW UP: Our staff will call the number listed on your records the next business day following your procedure.  We will call around 7:15- 8:00 am to check on you and address any questions or concerns that you may have regarding the information given to you following your procedure. If we do not reach you, we will leave a message.     If any biopsies were taken you will be contacted by phone or by letter within the next 1-3 weeks.  Please call us at [phone] if you have not heard about the biopsies in 3 weeks.    SIGNATURES/CONFIDENTIALITY: You and/or your care partner have signed paperwork which will be entered into your electronic medical record.  These signatures attest to the fact that that the information above on your After Visit Summary has been reviewed and is understood.  Full responsibility of the confidentiality of this discharge information lies with you and/or your care-partner.

## 2023-02-14 NOTE — Progress Notes (Signed)
 Called to room to assist during endoscopic procedure.  Patient ID and intended procedure confirmed with present staff. Received instructions for my participation in the procedure from the performing physician.

## 2023-02-17 ENCOUNTER — Telehealth: Payer: Self-pay

## 2023-02-17 NOTE — Telephone Encounter (Signed)
  Follow up Call-     02/14/2023    1:41 PM  Call back number  Post procedure Call Back phone  # [phone]  Permission to leave phone message Yes    Attempted to call patient regarding follow-up, no answer. VM left.

## 2023-02-19 LAB — SURGICAL PATHOLOGY

## 2023-02-20 ENCOUNTER — Encounter: Payer: Self-pay | Admitting: Internal Medicine

## 2023-02-20 ENCOUNTER — Other Ambulatory Visit (HOSPITAL_COMMUNITY): Payer: Self-pay

## 2023-02-21 DIAGNOSIS — M25512 Pain in left shoulder: Secondary | ICD-10-CM | POA: Diagnosis not present

## 2023-02-24 ENCOUNTER — Other Ambulatory Visit: Payer: HMO

## 2023-02-25 ENCOUNTER — Other Ambulatory Visit (HOSPITAL_COMMUNITY): Payer: Self-pay

## 2023-02-25 ENCOUNTER — Other Ambulatory Visit: Payer: Self-pay

## 2023-02-25 DIAGNOSIS — E782 Mixed hyperlipidemia: Secondary | ICD-10-CM | POA: Diagnosis not present

## 2023-02-25 DIAGNOSIS — Z6830 Body mass index (BMI) 30.0-30.9, adult: Secondary | ICD-10-CM | POA: Diagnosis not present

## 2023-02-25 DIAGNOSIS — E1159 Type 2 diabetes mellitus with other circulatory complications: Secondary | ICD-10-CM | POA: Diagnosis not present

## 2023-02-25 DIAGNOSIS — E6609 Other obesity due to excess calories: Secondary | ICD-10-CM | POA: Diagnosis not present

## 2023-02-25 DIAGNOSIS — E039 Hypothyroidism, unspecified: Secondary | ICD-10-CM | POA: Diagnosis not present

## 2023-02-25 DIAGNOSIS — I10 Essential (primary) hypertension: Secondary | ICD-10-CM | POA: Diagnosis not present

## 2023-02-26 ENCOUNTER — Other Ambulatory Visit (HOSPITAL_COMMUNITY): Payer: Self-pay

## 2023-02-26 MED ORDER — LOSARTAN POTASSIUM-HCTZ 100-25 MG PO TABS
1.0000 | ORAL_TABLET | Freq: Every day | ORAL | 1 refills | Status: AC
  Filled 2023-02-26: qty 90, 90d supply, fill #0
  Filled 2023-06-30 – 2023-10-10 (×4): qty 90, 90d supply, fill #1

## 2023-03-10 ENCOUNTER — Encounter: Payer: Self-pay | Admitting: Bariatrics

## 2023-03-10 ENCOUNTER — Other Ambulatory Visit (HOSPITAL_COMMUNITY): Payer: Self-pay

## 2023-03-10 ENCOUNTER — Ambulatory Visit (INDEPENDENT_AMBULATORY_CARE_PROVIDER_SITE_OTHER): Payer: HMO | Admitting: Bariatrics

## 2023-03-10 VITALS — BP 150/78 | HR 63 | Temp 98.3°F | Ht 64.0 in | Wt 178.0 lb

## 2023-03-10 DIAGNOSIS — Z7985 Long-term (current) use of injectable non-insulin antidiabetic drugs: Secondary | ICD-10-CM | POA: Diagnosis not present

## 2023-03-10 DIAGNOSIS — E785 Hyperlipidemia, unspecified: Secondary | ICD-10-CM

## 2023-03-10 DIAGNOSIS — E1169 Type 2 diabetes mellitus with other specified complication: Secondary | ICD-10-CM | POA: Diagnosis not present

## 2023-03-10 DIAGNOSIS — E559 Vitamin D deficiency, unspecified: Secondary | ICD-10-CM

## 2023-03-10 DIAGNOSIS — I10 Essential (primary) hypertension: Secondary | ICD-10-CM | POA: Diagnosis not present

## 2023-03-10 DIAGNOSIS — E538 Deficiency of other specified B group vitamins: Secondary | ICD-10-CM

## 2023-03-10 DIAGNOSIS — Z6830 Body mass index (BMI) 30.0-30.9, adult: Secondary | ICD-10-CM | POA: Diagnosis not present

## 2023-03-10 DIAGNOSIS — E669 Obesity, unspecified: Secondary | ICD-10-CM

## 2023-03-10 MED ORDER — TIRZEPATIDE 15 MG/0.5ML ~~LOC~~ SOAJ
15.0000 mg | SUBCUTANEOUS | 0 refills | Status: DC
  Filled 2023-03-10 – 2023-03-17 (×2): qty 2, 28d supply, fill #0

## 2023-03-10 NOTE — Progress Notes (Unsigned)
 WEIGHT SUMMARY AND BIOMETRICS  Weight Lost Since Last Visit: 0lb  Weight Gained Since Last Visit: 0lb   Vitals Temp: 98.3 F (36.8 C) BP: (!) 176/81 Pulse Rate: 63 SpO2: 97 %   Anthropometric Measurements Height: 5\' 4"  (1.626 m) Weight: 178 lb (80.7 kg) BMI (Calculated): 30.54 Weight at Last Visit: 172lb Weight Lost Since Last Visit: 0lb Weight Gained Since Last Visit: 0lb Starting Weight: 241lb Total Weight Loss (lbs): 63 lb (28.6 kg)   Body Composition  Body Fat %: 41.6 % Fat Mass (lbs): 74.2 lbs Muscle Mass (lbs): 99 lbs Total Body Water (lbs): 75.6 lbs Visceral Fat Rating : 11   Other Clinical Data Fasting: Yes Labs: Yes Today's Visit #: 40 Starting Date: 09/10/17    OBESITY Crystal Roberts is here to discuss her progress with her obesity treatment plan along with follow-up of her obesity related diagnoses.    Nutrition Plan: the Category 2 plan - 85-90% adherence.  Current exercise: walking  Interim History:  She is up 6 lbs since her last visit..  Eating all of the food on the plan., Protein intake is as prescribed, Water intake is adequate., Denies polyphagia, and Reports excessive cravings.   Pharmacotherapy: Crystal Roberts is on Mounjaro 15 mg SQ weekly Adverse side effects: None Hunger is moderately controlled.  Cravings are poorly controlled.  Assessment/Plan:   1. Type II Diabetes HgbA1c is at goal. Last A1c was 5.6 CBGs: {dwwcbg:29160}      Episodes of hypoglycemia: no Medication(s): Mounjaro 15 mg SQ weekly  Lab Results  Component Value Date   HGBA1C 5.6 07/31/2022   HGBA1C 5.8 (H) 12/13/2021   HGBA1C 6.0 (H) 11/18/2017   Lab Results  Component Value Date   LDLCALC 141 (H) 07/31/2022   CREATININE 0.76 07/31/2022    Plan: Continue and refill Mounjaro 15 mg SQ weekly Continue all other medications.  Will keep all carbohydrates  low both sweets and starches.  Will continue exercise regimen to 30 to 60 minutes on most days of the week.  Aim for 7 to 9 hours of sleep nightly.  Eat more low glycemic index foods.  Will check HbgA1c and insulin today.   2. Hyperlipidemia associated with type 2 diabetes mellitus (HCC)  LDL is not at goal. Medication(s): Crestor  Cardiovascular risk factors: advanced age (older than 38 for men, 73 for women), diabetes mellitus, dyslipidemia, hypertension, obesity (BMI >= 30 kg/m2), and sedentary lifestyle  Lab Results  Component Value Date   CHOL 241 (H) 07/31/2022   HDL 83 07/31/2022   LDLCALC 141 (H) 07/31/2022   TRIG 102 07/31/2022   Lab Results  Component Value Date   ALT 15 07/31/2022   AST 16 07/31/2022   ALKPHOS 92 07/31/2022   BILITOT 0.4 07/31/2022   The 10-year ASCVD risk score (Arnett DK, et al., 2019) is: 23.2%   Values used to  calculate the score:     Age: 68 years     Sex: Female     Is Non-Hispanic African American: No     Diabetic: Yes     Tobacco smoker: No     Systolic Blood Pressure: 176 mmHg     Is BP treated: Yes     HDL Cholesterol: 83 mg/dL     Total Cholesterol: 241 mg/dL  Plan:  Continue statin.  Information sheet on healthy vs unhealthy fats.  Will avoid all trans fats.  Will read labels Will minimize saturated fats except the following: low fat meats in moderation, diary, and limited dark chocolate.  Increase Omega 3 in foods.     3. Vitamin D deficiency  Vitamin D is not at goal of 50.  Most recent vitamin D level was 44.1. She is on OTC vitamin D3 2000 IU daily. Lab Results  Component Value Date   VD25OH 44.1 07/31/2022   VD25OH 52.9 12/13/2021   VD25OH 43.8 01/05/2020    Plan: Continue prescription vitamin D 50,000 IU weekly.   4. Vitamin B 12 deficiency  B 12 deficiency:   He/She is taking/not taking B12 (oral/sublingual). He/She has a history of B12 deficiency and had the following s/s in the past.   Plan:  Check B  12 lab today.  Continue/Begin B 12.    5. Hypertension Hypertension control uncertain. She is having shoulder pain and is going to have surgery in the near future.  Medication(s): Amlodipine 10 mg 1 daily  and Hyzaar 100/12.5 mg daily   BP Readings from Last 3 Encounters:  03/10/23 (!) 176/81  02/14/23 114/60  02/13/23 109/68   Lab Results  Component Value Date   CREATININE 0.76 07/31/2022   CREATININE 0.84 12/13/2021   CREATININE 0.83 01/05/2020   No results found for: "GFR"  Plan: Continue all antihypertensives at current dosages. She will check her blood pressure at home.  No added salt. Will keep sodium content to 1,500 mg or less per day.     Generalized Obesity: Current BMI BMI (Calculated): 30.54   Pharmacotherapy Plan Continue and refill  Mounjaro 15 mg SQ weekly  Crystal Roberts is currently in the action stage of change. As such, her goal is to continue with weight loss efforts.  She has agreed to the Category 2 plan.  Exercise goals: Older adults should determine their level of effort for physical activity relative to their level of fitness.   Behavioral modification strategies: increasing lean protein intake, no meal skipping, meal planning , increase water intake, better snacking choices, planning for success, and increasing fiber rich foods.  Crystal Roberts has agreed to follow-up with our clinic in 6 weeks.   Orders Placed This Encounter  Procedures   Lipid Panel With LDL/HDL Ratio   VITAMIN D 25 Hydroxy (Vit-D Deficiency, Fractures)   Hemoglobin A1c   Insulin, random   Comprehensive metabolic panel   Vitamin B12        Objective:   VITALS: Per patient if applicable, see vitals. GENERAL: Alert and in no acute distress. CARDIOPULMONARY: No increased WOB. Speaking in clear sentences.  PSYCH: Pleasant and cooperative. Speech normal rate and rhythm. Affect is appropriate. Insight and judgement are appropriate. Attention is focused, linear, and appropriate.  NEURO:  Oriented as arrived to appointment on time with no prompting.   Attestation Statements:    This was prepared with the assistance of Engineer, civil (consulting).  Occasional wrong-word or sound-a-like substitutions may have occurred due to the  inherent limitations of voice recognition

## 2023-03-11 LAB — COMPREHENSIVE METABOLIC PANEL
ALT: 10 IU/L (ref 0–32)
AST: 14 IU/L (ref 0–40)
Albumin: 4 g/dL (ref 3.9–4.9)
Alkaline Phosphatase: 82 IU/L (ref 44–121)
BUN/Creatinine Ratio: 15 (ref 12–28)
BUN: 14 mg/dL (ref 8–27)
Bilirubin Total: 0.3 mg/dL (ref 0.0–1.2)
CO2: 24 mmol/L (ref 20–29)
Calcium: 9.5 mg/dL (ref 8.7–10.3)
Chloride: 105 mmol/L (ref 96–106)
Creatinine, Ser: 0.93 mg/dL (ref 0.57–1.00)
Globulin, Total: 2.2 g/dL (ref 1.5–4.5)
Glucose: 79 mg/dL (ref 70–99)
Potassium: 4.5 mmol/L (ref 3.5–5.2)
Sodium: 144 mmol/L (ref 134–144)
Total Protein: 6.2 g/dL (ref 6.0–8.5)
eGFR: 68 mL/min/{1.73_m2} (ref >59)

## 2023-03-11 LAB — LIPID PANEL WITH LDL/HDL RATIO
Cholesterol, Total: 182 mg/dL (ref 100–199)
HDL: 69 mg/dL (ref >39)
LDL Chol Calc (NIH): 84 mg/dL (ref 0–99)
LDL/HDL Ratio: 1.2 ratio (ref 0.0–3.2)
Triglycerides: 174 mg/dL — ABNORMAL HIGH (ref 0–149)
VLDL Cholesterol Cal: 29 mg/dL (ref 5–40)

## 2023-03-11 LAB — VITAMIN B12: Vitamin B-12: 256 pg/mL (ref 232–1245)

## 2023-03-11 LAB — HEMOGLOBIN A1C
Est. average glucose Bld gHb Est-mCnc: 114 mg/dL
Hgb A1c MFr Bld: 5.6 % (ref 4.8–5.6)

## 2023-03-11 LAB — INSULIN, RANDOM: INSULIN: 8.1 u[IU]/mL (ref 2.6–24.9)

## 2023-03-11 LAB — VITAMIN D 25 HYDROXY (VIT D DEFICIENCY, FRACTURES): Vit D, 25-Hydroxy: 39.9 ng/mL (ref 30.0–100.0)

## 2023-03-17 ENCOUNTER — Other Ambulatory Visit (HOSPITAL_COMMUNITY): Payer: Self-pay

## 2023-03-27 ENCOUNTER — Ambulatory Visit: Attending: Orthopaedic Surgery

## 2023-03-27 ENCOUNTER — Other Ambulatory Visit: Payer: Self-pay

## 2023-03-27 DIAGNOSIS — M6281 Muscle weakness (generalized): Secondary | ICD-10-CM | POA: Insufficient documentation

## 2023-03-27 DIAGNOSIS — M25512 Pain in left shoulder: Secondary | ICD-10-CM | POA: Insufficient documentation

## 2023-03-27 DIAGNOSIS — G8929 Other chronic pain: Secondary | ICD-10-CM | POA: Diagnosis not present
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
## 2023-04-02 DIAGNOSIS — G8918 Other acute postprocedural pain: Secondary | ICD-10-CM | POA: Diagnosis not present

## 2023-04-02 DIAGNOSIS — M19012 Primary osteoarthritis, left shoulder: Secondary | ICD-10-CM | POA: Diagnosis not present

## 2023-04-02 DIAGNOSIS — M75102 Unspecified rotator cuff tear or rupture of left shoulder, not specified as traumatic: Secondary | ICD-10-CM | POA: Diagnosis not present

## 2023-04-04 ENCOUNTER — Ambulatory Visit

## 2023-04-04 DIAGNOSIS — M6281 Muscle weakness (generalized): Secondary | ICD-10-CM

## 2023-04-04 DIAGNOSIS — M25512 Pain in left shoulder: Secondary | ICD-10-CM | POA: Diagnosis not present

## 2023-04-04 DIAGNOSIS — G8929 Other chronic pain: Secondary | ICD-10-CM
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
## 2023-04-09 ENCOUNTER — Ambulatory Visit

## 2023-04-09 DIAGNOSIS — G8929 Other chronic pain: Secondary | ICD-10-CM

## 2023-04-09 DIAGNOSIS — M25512 Pain in left shoulder: Secondary | ICD-10-CM | POA: Diagnosis not present

## 2023-04-09 DIAGNOSIS — M6281 Muscle weakness (generalized): Secondary | ICD-10-CM
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
## 2023-04-11 DIAGNOSIS — M19012 Primary osteoarthritis, left shoulder: Secondary | ICD-10-CM | POA: Diagnosis not present

## 2023-04-17 ENCOUNTER — Ambulatory Visit: Attending: Orthopaedic Surgery

## 2023-04-17 DIAGNOSIS — G8929 Other chronic pain: Secondary | ICD-10-CM | POA: Insufficient documentation

## 2023-04-17 DIAGNOSIS — M6281 Muscle weakness (generalized): Secondary | ICD-10-CM | POA: Diagnosis not present

## 2023-04-17 DIAGNOSIS — M25512 Pain in left shoulder: Secondary | ICD-10-CM | POA: Diagnosis not present
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
## 2023-04-21 ENCOUNTER — Other Ambulatory Visit (HOSPITAL_COMMUNITY): Payer: Self-pay

## 2023-04-22 ENCOUNTER — Ambulatory Visit

## 2023-04-22 DIAGNOSIS — G8929 Other chronic pain: Secondary | ICD-10-CM

## 2023-04-22 DIAGNOSIS — M25512 Pain in left shoulder: Secondary | ICD-10-CM | POA: Diagnosis not present

## 2023-04-22 DIAGNOSIS — M6281 Muscle weakness (generalized): Secondary | ICD-10-CM
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
## 2023-04-24 ENCOUNTER — Ambulatory Visit: Payer: HMO | Admitting: Bariatrics

## 2023-05-01 ENCOUNTER — Ambulatory Visit

## 2023-05-01 DIAGNOSIS — G8929 Other chronic pain: Secondary | ICD-10-CM

## 2023-05-01 DIAGNOSIS — M25512 Pain in left shoulder: Secondary | ICD-10-CM | POA: Diagnosis not present

## 2023-05-01 DIAGNOSIS — M6281 Muscle weakness (generalized): Secondary | ICD-10-CM
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
## 2023-05-06 DIAGNOSIS — M19012 Primary osteoarthritis, left shoulder: Secondary | ICD-10-CM | POA: Diagnosis not present

## 2023-05-08 ENCOUNTER — Ambulatory Visit

## 2023-05-08 DIAGNOSIS — M6281 Muscle weakness (generalized): Secondary | ICD-10-CM

## 2023-05-08 DIAGNOSIS — M25512 Pain in left shoulder: Secondary | ICD-10-CM | POA: Diagnosis not present

## 2023-05-08 DIAGNOSIS — G8929 Other chronic pain: Secondary | ICD-10-CM
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
## 2023-05-15 ENCOUNTER — Ambulatory Visit

## 2023-05-19 ENCOUNTER — Ambulatory Visit (INDEPENDENT_AMBULATORY_CARE_PROVIDER_SITE_OTHER): Admitting: Bariatrics

## 2023-05-19 ENCOUNTER — Other Ambulatory Visit (HOSPITAL_COMMUNITY): Payer: Self-pay

## 2023-05-19 ENCOUNTER — Encounter: Payer: Self-pay | Admitting: Bariatrics

## 2023-05-19 VITALS — BP 128/84 | HR 60 | Temp 97.5°F | Ht 64.0 in | Wt 180.0 lb

## 2023-05-19 DIAGNOSIS — E1169 Type 2 diabetes mellitus with other specified complication: Secondary | ICD-10-CM

## 2023-05-19 DIAGNOSIS — I10 Essential (primary) hypertension: Secondary | ICD-10-CM | POA: Diagnosis not present

## 2023-05-19 DIAGNOSIS — E66811 Obesity, class 1: Secondary | ICD-10-CM

## 2023-05-19 DIAGNOSIS — Z7985 Long-term (current) use of injectable non-insulin antidiabetic drugs: Secondary | ICD-10-CM | POA: Diagnosis not present

## 2023-05-19 DIAGNOSIS — E119 Type 2 diabetes mellitus without complications: Secondary | ICD-10-CM

## 2023-05-19 DIAGNOSIS — E669 Obesity, unspecified: Secondary | ICD-10-CM

## 2023-05-19 DIAGNOSIS — Z6830 Body mass index (BMI) 30.0-30.9, adult: Secondary | ICD-10-CM | POA: Diagnosis not present

## 2023-05-19 DIAGNOSIS — E6609 Other obesity due to excess calories: Secondary | ICD-10-CM

## 2023-05-19 MED ORDER — TIRZEPATIDE 15 MG/0.5ML ~~LOC~~ SOAJ
15.0000 mg | SUBCUTANEOUS | 0 refills | Status: DC
  Filled 2023-05-19 (×2): qty 2, 28d supply, fill #0

## 2023-05-19 NOTE — Progress Notes (Signed)
 WEIGHT SUMMARY AND BIOMETRICS  Weight Lost Since Last Visit: 0  Weight Gained Since Last Visit: 2lb   Vitals Temp: (!) 97.5 F (36.4 C) BP: 128/84 Pulse Rate: 60 SpO2: 99 %   Anthropometric Measurements Height: 5\' 4"  (1.626 m) Weight: 180 lb (81.6 kg) BMI (Calculated): 30.88 Weight at Last Visit: 178lb Weight Lost Since Last Visit: 0 Weight Gained Since Last Visit: 2lb Starting Weight: 241lb Total Weight Loss (lbs): 61 lb (27.7 kg)   Body Composition  Body Fat %: 40.7 % Fat Mass (lbs): 73.4 lbs Muscle Mass (lbs): 101.4 lbs Total Body Water (lbs): 71.8 lbs Visceral Fat Rating : 11   Other Clinical Data Fasting: yes Labs: no Today's Visit #: 75 Starting Date: 09/10/17    OBESITY Crystal Roberts is here to discuss her progress with her obesity treatment plan along with follow-up of her obesity related diagnoses.    Nutrition Plan: the Category 2 plan - 60% adherence.  Current exercise: walking, going to PT and doing the exercises at home as well.   Interim History:  She is up 2 lbs since her last visit, but is doing well overall.  Eating all of the food on the plan., Protein intake is as prescribed, Is not skipping meals, and Denies excessive cravings.   Pharmacotherapy: Crystal Roberts is on Mounjaro  15 mg SQ weekly Adverse side effects: None Hunger is moderately controlled.  Cravings are moderately controlled.  Assessment/Plan:   Type II Diabetes HgbA1c is at goal. Last A1c was 5.6 Episodes of hypoglycemia: no Medication(s): Mounjaro  15 mg SQ weekly  Lab Results  Component Value Date   HGBA1C 5.6 03/10/2023   HGBA1C 5.6 07/31/2022   HGBA1C 5.8 (H) 12/13/2021   Lab Results  Component Value Date   LDLCALC 84 03/10/2023   CREATININE 0.93 03/10/2023   No results found for: "GFR"  Plan: Continue and refill Mounjaro  15 mg SQ weekly Continue all  other medications.  Will keep all carbohydrates low both sweets and starches.  Will continue exercise regimen to 30 to 60 minutes on most days of the week.  Aim for 7 to 9 hours of sleep nightly.  Eat more low glycemic index foods.   Hypertension Hypertension stable.  Medication(s): Amlodipine  10 mg 1 daily  and Hyzaar  100/25 mg daily  BP Readings from Last 3 Encounters:  05/19/23 128/84  03/10/23 (!) 150/78  02/14/23 114/60   Lab Results  Component Value Date   CREATININE 0.93 03/10/2023   CREATININE 0.76 07/31/2022   CREATININE 0.84 12/13/2021   No results found for: "GFR"  Plan: Continue all antihypertensives at current dosages. No added salt. Will keep sodium content to 1,500 mg or less per day.    Generalized Obesity: Current BMI BMI (Calculated): 30.88   Pharmacotherapy Plan Continue and refill  Mounjaro  15 mg SQ weekly  Crystal Roberts is currently in the action stage  of change. As such, her goal is to continue with weight loss efforts.  She has agreed to the Category 2 plan.  Exercise goals: Older adults should determine their level of effort for physical activity relative to their level of fitness.   Behavioral modification strategies: increasing lean protein intake, decreasing simple carbohydrates , no meal skipping, meal planning , increase water intake, better snacking choices, planning for success, increasing fiber rich foods, and mindful eating.  Crystal Roberts has agreed to follow-up with our clinic in 6 weeks.    Objective:   VITALS: Per patient if applicable, see vitals. GENERAL: Alert and in no acute distress. CARDIOPULMONARY: No increased WOB. Speaking in clear sentences.  PSYCH: Pleasant and cooperative. Speech normal rate and rhythm. Affect is appropriate. Insight and judgement are appropriate. Attention is focused, linear, and appropriate.  NEURO: Oriented as arrived to appointment on time with no prompting.   Attestation Statements:   This was prepared with the  assistance of Engineer, civil (consulting).  Occasional wrong-word or sound-a-like substitutions may have occurred due to the inherent limitations of voice recognition   Kirk Peper, DO

## 2023-05-22 ENCOUNTER — Ambulatory Visit: Attending: Orthopaedic Surgery

## 2023-05-22 DIAGNOSIS — G8929 Other chronic pain: Secondary | ICD-10-CM | POA: Diagnosis not present

## 2023-05-22 DIAGNOSIS — M25512 Pain in left shoulder: Secondary | ICD-10-CM | POA: Insufficient documentation

## 2023-05-22 DIAGNOSIS — M6281 Muscle weakness (generalized): Secondary | ICD-10-CM | POA: Diagnosis not present
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
## 2023-05-29 ENCOUNTER — Ambulatory Visit

## 2023-05-29 ENCOUNTER — Other Ambulatory Visit (HOSPITAL_COMMUNITY): Payer: Self-pay

## 2023-05-29 DIAGNOSIS — G8929 Other chronic pain: Secondary | ICD-10-CM

## 2023-05-29 DIAGNOSIS — M25512 Pain in left shoulder: Secondary | ICD-10-CM | POA: Diagnosis not present

## 2023-05-29 DIAGNOSIS — M6281 Muscle weakness (generalized): Secondary | ICD-10-CM
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
## 2023-06-05 ENCOUNTER — Ambulatory Visit

## 2023-06-05 DIAGNOSIS — G8929 Other chronic pain: Secondary | ICD-10-CM

## 2023-06-05 DIAGNOSIS — M25512 Pain in left shoulder: Secondary | ICD-10-CM | POA: Diagnosis not present

## 2023-06-05 DIAGNOSIS — M6281 Muscle weakness (generalized): Secondary | ICD-10-CM
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
## 2023-06-12 ENCOUNTER — Ambulatory Visit

## 2023-06-12 DIAGNOSIS — G8929 Other chronic pain: Secondary | ICD-10-CM

## 2023-06-12 DIAGNOSIS — M6281 Muscle weakness (generalized): Secondary | ICD-10-CM

## 2023-06-12 DIAGNOSIS — M25512 Pain in left shoulder: Secondary | ICD-10-CM | POA: Diagnosis not present
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
## 2023-06-19 ENCOUNTER — Ambulatory Visit: Attending: Orthopaedic Surgery

## 2023-06-19 DIAGNOSIS — G8929 Other chronic pain: Secondary | ICD-10-CM | POA: Diagnosis not present

## 2023-06-19 DIAGNOSIS — M6281 Muscle weakness (generalized): Secondary | ICD-10-CM | POA: Insufficient documentation

## 2023-06-19 DIAGNOSIS — M25512 Pain in left shoulder: Secondary | ICD-10-CM | POA: Diagnosis not present
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
## 2023-06-26 ENCOUNTER — Encounter

## 2023-06-30 ENCOUNTER — Other Ambulatory Visit (HOSPITAL_COMMUNITY): Payer: Self-pay

## 2023-06-30 ENCOUNTER — Encounter: Payer: Self-pay | Admitting: Bariatrics

## 2023-06-30 ENCOUNTER — Ambulatory Visit (INDEPENDENT_AMBULATORY_CARE_PROVIDER_SITE_OTHER): Admitting: Bariatrics

## 2023-06-30 VITALS — BP 134/80 | HR 63 | Temp 98.2°F | Ht 64.0 in | Wt 184.0 lb

## 2023-06-30 DIAGNOSIS — Z6831 Body mass index (BMI) 31.0-31.9, adult: Secondary | ICD-10-CM | POA: Diagnosis not present

## 2023-06-30 DIAGNOSIS — Z7985 Long-term (current) use of injectable non-insulin antidiabetic drugs: Secondary | ICD-10-CM

## 2023-06-30 DIAGNOSIS — I10 Essential (primary) hypertension: Secondary | ICD-10-CM

## 2023-06-30 DIAGNOSIS — E1169 Type 2 diabetes mellitus with other specified complication: Secondary | ICD-10-CM

## 2023-06-30 DIAGNOSIS — E669 Obesity, unspecified: Secondary | ICD-10-CM

## 2023-06-30 DIAGNOSIS — E6609 Other obesity due to excess calories: Secondary | ICD-10-CM

## 2023-06-30 DIAGNOSIS — E119 Type 2 diabetes mellitus without complications: Secondary | ICD-10-CM | POA: Diagnosis not present

## 2023-06-30 MED ORDER — MELOXICAM 15 MG PO TABS
15.0000 mg | ORAL_TABLET | Freq: Every day | ORAL | 0 refills | Status: AC
  Filled 2023-06-30 – 2023-10-10 (×4): qty 90, 90d supply, fill #0

## 2023-06-30 MED ORDER — BUPROPION HCL ER (XL) 300 MG PO TB24
300.0000 mg | ORAL_TABLET | Freq: Every day | ORAL | 0 refills | Status: DC
  Filled 2023-06-30 – 2023-10-10 (×4): qty 90, 90d supply, fill #0

## 2023-06-30 MED ORDER — ROSUVASTATIN CALCIUM 20 MG PO TABS
20.0000 mg | ORAL_TABLET | Freq: Every day | ORAL | 1 refills | Status: AC
  Filled 2023-06-30 – 2023-10-10 (×4): qty 90, 90d supply, fill #0

## 2023-06-30 MED ORDER — TIRZEPATIDE 15 MG/0.5ML ~~LOC~~ SOAJ
15.0000 mg | SUBCUTANEOUS | 0 refills | Status: DC
Start: 2023-06-30 — End: 2023-07-28
  Filled 2023-06-30: qty 2, 28d supply, fill #0

## 2023-06-30 MED ORDER — FLUOXETINE HCL 20 MG PO CAPS
20.0000 mg | ORAL_CAPSULE | Freq: Every morning | ORAL | 0 refills | Status: DC
  Filled 2023-06-30 – 2023-10-10 (×4): qty 90, 90d supply, fill #0

## 2023-06-30 MED ORDER — AMLODIPINE BESYLATE 10 MG PO TABS
10.0000 mg | ORAL_TABLET | Freq: Every day | ORAL | 1 refills | Status: AC
  Filled 2023-06-30 – 2023-10-10 (×4): qty 90, 90d supply, fill #0

## 2023-06-30 MED ORDER — LEVOTHYROXINE SODIUM 75 MCG PO TABS
75.0000 ug | ORAL_TABLET | Freq: Every day | ORAL | 0 refills | Status: DC
  Filled 2023-06-30 – 2023-10-10 (×4): qty 90, 90d supply, fill #0

## 2023-06-30 NOTE — Progress Notes (Signed)
 WEIGHT SUMMARY AND BIOMETRICS  Weight Lost Since Last Visit: 0  Weight Gained Since Last Visit: 4lb   Vitals Temp: 98.2 F (36.8 C) BP: 134/80 Pulse Rate: 63 SpO2: 98 %   Anthropometric Measurements Height: 5' 4 (1.626 m) Weight: 184 lb (83.5 kg) BMI (Calculated): 31.57 Weight at Last Visit: 180lb Weight Lost Since Last Visit: 0 Weight Gained Since Last Visit: 4lb Starting Weight: 241lb Total Weight Loss (lbs): 57 lb (25.9 kg)   Body Composition  Body Fat %: 40.6 % Fat Mass (lbs): 74.8 lbs Muscle Mass (lbs): 103.8 lbs Total Body Water (lbs): 72.2 lbs Visceral Fat Rating : 11   Other Clinical Data Fasting: no Labs: no Today's Visit #: 75 Starting Date: 09/10/17    OBESITY Crystal Roberts is here to discuss her progress with her obesity treatment plan along with follow-up of her obesity related diagnoses.    Nutrition Plan: the Category 2 plan - 50% adherence.  Current exercise: walking  Interim History:   Protein intake is as prescribed, Is not skipping meals, Water intake is adequate., and Reports excessive cravings.   Pharmacotherapy: Crystal Roberts is on Mounjaro  15 mg SQ weekly Adverse side effects: None Hunger is moderately controlled.  Cravings are moderately controlled.  Assessment/Plan:    Type II Diabetes HgbA1c is at goal. Last A1c was 5.6 CBGs: Not checking      Episodes of hypoglycemia: no Medication(s): Mounjaro  15 mg SQ weekly  Lab Results  Component Value Date   HGBA1C 5.6 03/10/2023   HGBA1C 5.6 07/31/2022   HGBA1C 5.8 (H) 12/13/2021   Lab Results  Component Value Date   LDLCALC 84 03/10/2023   CREATININE 0.93 03/10/2023   No results found for: GFR  Plan: Continue and refill Mounjaro  15 mg SQ weekly Continue all other medications.  Will keep all carbohydrates low both sweets and starches.  Will continue exercise regimen  to 30 to 60 minutes on most days of the week.  Aim for 7 to 9 hours of sleep nightly.  Eat more low glycemic index foods.  Will control her snacking and eat her 3 meals daily.    Hypertension Hypertension well controlled.  Medication(s): Amlodipine  10 mg 1 daily  and Hyzaar  100/25 mg daily  BP Readings from Last 3 Encounters:  06/30/23 134/80  05/19/23 128/84  03/10/23 (!) 150/78   Lab Results  Component Value Date   CREATININE 0.93 03/10/2023   CREATININE 0.76 07/31/2022   CREATININE 0.84 12/13/2021   No results found for: GFR  Plan: Continue all antihypertensives at current dosages. No added salt. Will keep sodium content to 1,500 mg or less per day.      Generalized Obesity: Current BMI BMI (Calculated): 31.57   Pharmacotherapy Plan Continue and refill  Mounjaro  15 mg SQ weekly  Crystal Roberts is currently in the action stage of change. As such, her goal is  to continue with weight loss efforts.  She has agreed to the Category 2 plan.  Exercise goals: Older adults should follow the adult guidelines. When older adults cannot meet the adult guidelines, they should be as physically active as their abilities and conditions will allow.   Behavioral modification strategies: increasing lean protein intake, no meal skipping, meal planning , increase water intake, better snacking choices, planning for success, increasing vegetables, decrease snacking , avoiding temptations, keep healthy foods in the home, measure portion sizes, work on smaller portions, and mindful eating.  Crystal Roberts has agreed to follow-up with our clinic in 4 weeks.    Objective:   VITALS: Per patient if applicable, see vitals. GENERAL: Alert and in no acute distress. CARDIOPULMONARY: No increased WOB. Speaking in clear sentences.  PSYCH: Pleasant and cooperative. Speech normal rate and rhythm. Affect is appropriate. Insight and judgement are appropriate. Attention is focused, linear, and appropriate.  NEURO:  Oriented as arrived to appointment on time with no prompting.   Attestation Statements:   This was prepared with the assistance of Engineer, civil (consulting).  Occasional wrong-word or sound-a-like substitutions may have occurred due to the inherent limitations of voice recognition   Kirk Peper, DO

## 2023-07-01 ENCOUNTER — Other Ambulatory Visit: Payer: Self-pay

## 2023-07-03 ENCOUNTER — Ambulatory Visit

## 2023-07-03 DIAGNOSIS — M25512 Pain in left shoulder: Secondary | ICD-10-CM | POA: Diagnosis not present

## 2023-07-03 DIAGNOSIS — G8929 Other chronic pain: Secondary | ICD-10-CM

## 2023-07-03 DIAGNOSIS — M6281 Muscle weakness (generalized): Secondary | ICD-10-CM
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
## 2023-07-04 ENCOUNTER — Other Ambulatory Visit: Payer: Self-pay

## 2023-07-08 DIAGNOSIS — M19012 Primary osteoarthritis, left shoulder: Secondary | ICD-10-CM | POA: Diagnosis not present

## 2023-07-10 ENCOUNTER — Encounter

## 2023-07-28 ENCOUNTER — Other Ambulatory Visit (HOSPITAL_COMMUNITY): Payer: Self-pay

## 2023-07-28 ENCOUNTER — Encounter: Payer: Self-pay | Admitting: Bariatrics

## 2023-07-28 ENCOUNTER — Ambulatory Visit (INDEPENDENT_AMBULATORY_CARE_PROVIDER_SITE_OTHER): Admitting: Bariatrics

## 2023-07-28 DIAGNOSIS — I10 Essential (primary) hypertension: Secondary | ICD-10-CM

## 2023-07-28 DIAGNOSIS — Z7985 Long-term (current) use of injectable non-insulin antidiabetic drugs: Secondary | ICD-10-CM | POA: Diagnosis not present

## 2023-07-28 DIAGNOSIS — E669 Obesity, unspecified: Secondary | ICD-10-CM

## 2023-07-28 DIAGNOSIS — Z6830 Body mass index (BMI) 30.0-30.9, adult: Secondary | ICD-10-CM | POA: Diagnosis not present

## 2023-07-28 DIAGNOSIS — E119 Type 2 diabetes mellitus without complications: Secondary | ICD-10-CM | POA: Diagnosis not present

## 2023-07-28 MED ORDER — TIRZEPATIDE 15 MG/0.5ML ~~LOC~~ SOAJ
15.0000 mg | SUBCUTANEOUS | 0 refills | Status: DC
  Filled 2023-07-28 – 2023-08-11 (×2): qty 2, 28d supply, fill #0

## 2023-07-28 NOTE — Progress Notes (Signed)
 WEIGHT SUMMARY AND BIOMETRICS  Weight Lost Since Last Visit: 8lb  Weight Gained Since Last Visit: 0   Vitals Temp: 97.9 F (36.6 C) BP: 121/72 Pulse Rate: 68 SpO2: 98 %   Anthropometric Measurements Height: 5' 4 (1.626 m) Weight: 176 lb (79.8 kg) BMI (Calculated): 30.2 Weight at Last Visit: 184lb Weight Lost Since Last Visit: 8lb Weight Gained Since Last Visit: 0 Starting Weight: 241lb Total Weight Loss (lbs): 65 lb (29.5 kg)   Body Composition  Body Fat %: 39.9 % Fat Mass (lbs): 70.4 lbs Muscle Mass (lbs): 100.6 lbs Total Body Water (lbs): 69.6 lbs Visceral Fat Rating : 11   Other Clinical Data Fasting: no Labs: no Today's Visit #: 70 Starting Date: 09/10/17    OBESITY Crystal Roberts is here to discuss her progress with her obesity treatment plan along with follow-up of her obesity related diagnoses.    Nutrition Plan: the Category 2 plan - 80% adherence.  Current exercise: walking  Interim History:  She is down 8 lbs since her last visit. Eating all of the food on the plan., Protein intake is as prescribed, Water intake is adequate., and Denies polyphagia   Pharmacotherapy: Crystal Roberts is on Mounjaro  15 mg SQ weekly Adverse side effects: None Hunger is well controlled.  Cravings are moderately controlled.  Assessment/Plan:   Type II Diabetes HgbA1c is at goal. Last A1c was 5.6 CBGs: Not checking      Episodes of hypoglycemia: no Medication(s): Mounjaro  15 mg SQ weekly  Lab Results  Component Value Date   HGBA1C 5.6 03/10/2023   HGBA1C 5.6 07/31/2022   HGBA1C 5.8 (H) 12/13/2021   Lab Results  Component Value Date   LDLCALC 84 03/10/2023   CREATININE 0.93 03/10/2023   No results found for: GFR  Plan: Continue and refill Mounjaro  15 mg SQ weekly Will keep all carbohydrates low both sweets and starches.  Will continue exercise regimen  to 30 to 60 minutes on most days of the week.  She usually walks at work and will continue to do so. Aim for 7 to 9 hours of sleep nightly.  Eat more low glycemic index foods.   Hypertension Hypertension well controlled.  Medication(s): Amlodipine  10 mg 1 daily   BP Readings from Last 3 Encounters:  07/28/23 121/72  06/30/23 134/80  05/19/23 128/84   Lab Results  Component Value Date   CREATININE 0.93 03/10/2023   CREATININE 0.76 07/31/2022   CREATININE 0.84 12/13/2021   No results found for: GFR  Plan: Continue all antihypertensives at current dosages. No added salt. Will keep sodium content to 1,500 mg or less per day.    She will check her blood sugar periodically, or if she does not feel well.    Generalized Obesity: Current BMI BMI (Calculated): 30.2   Pharmacotherapy Plan Continue and refill  Mounjaro  15 mg SQ weekly  Crystal Roberts  is currently in the action stage of change. As such, her goal is to continue with weight loss efforts.  She has agreed to the Category 2 plan.  Exercise goals: Older adults should determine their level of effort for physical activity relative to their level of fitness.   Behavioral modification strategies: increasing lean protein intake, no meal skipping, meal planning , better snacking choices, planning for success, increasing vegetables, increasing fiber rich foods, decrease snacking , avoiding temptations, keep healthy foods in the home, weigh protein portions, measure portion sizes, and mindful eating.  Crystal Roberts has agreed to follow-up with our clinic in 4 weeks.    Objective:   VITALS: Per patient if applicable, see vitals. GENERAL: Alert and in no acute distress. CARDIOPULMONARY: No increased WOB. Speaking in clear sentences.  PSYCH: Pleasant and cooperative. Speech normal rate and rhythm. Affect is appropriate. Insight and judgement are appropriate. Attention is focused, linear, and appropriate.  NEURO: Oriented as arrived to appointment  on time with no prompting.   Attestation Statements:   This was prepared with the assistance of Engineer, civil (consulting).  Occasional wrong-word or sound-a-like substitutions may have occurred due to the inherent limitations of voice recognition   Clayborne Daring, DO

## 2023-08-07 ENCOUNTER — Other Ambulatory Visit (HOSPITAL_COMMUNITY): Payer: Self-pay

## 2023-08-11 ENCOUNTER — Other Ambulatory Visit (HOSPITAL_COMMUNITY): Payer: Self-pay

## 2023-08-25 ENCOUNTER — Ambulatory Visit (INDEPENDENT_AMBULATORY_CARE_PROVIDER_SITE_OTHER): Admitting: Bariatrics

## 2023-08-25 ENCOUNTER — Other Ambulatory Visit (HOSPITAL_COMMUNITY): Payer: Self-pay

## 2023-08-25 ENCOUNTER — Encounter: Payer: Self-pay | Admitting: Bariatrics

## 2023-08-25 VITALS — BP 130/70 | HR 68 | Temp 98.2°F | Ht 64.0 in | Wt 174.0 lb

## 2023-08-25 DIAGNOSIS — Z6829 Body mass index (BMI) 29.0-29.9, adult: Secondary | ICD-10-CM | POA: Diagnosis not present

## 2023-08-25 DIAGNOSIS — E669 Obesity, unspecified: Secondary | ICD-10-CM | POA: Diagnosis not present

## 2023-08-25 DIAGNOSIS — Z7985 Long-term (current) use of injectable non-insulin antidiabetic drugs: Secondary | ICD-10-CM | POA: Diagnosis not present

## 2023-08-25 DIAGNOSIS — I10 Essential (primary) hypertension: Secondary | ICD-10-CM

## 2023-08-25 DIAGNOSIS — E119 Type 2 diabetes mellitus without complications: Secondary | ICD-10-CM

## 2023-08-25 DIAGNOSIS — E1169 Type 2 diabetes mellitus with other specified complication: Secondary | ICD-10-CM

## 2023-08-25 MED ORDER — TIRZEPATIDE 15 MG/0.5ML ~~LOC~~ SOAJ
15.0000 mg | SUBCUTANEOUS | 0 refills | Status: DC
  Filled 2023-08-25 – 2023-09-01 (×2): qty 2, 28d supply, fill #0

## 2023-08-25 NOTE — Progress Notes (Signed)
 WEIGHT SUMMARY AND BIOMETRICS  Weight Lost Since Last Visit: 2lb  Weight Gained Since Last Visit: 0   Vitals Temp: 98.2 F (36.8 C) BP: 130/70 Pulse Rate: 68 SpO2: 98 %   Anthropometric Measurements Height: 5' 4 (1.626 m) Weight: 174 lb (78.9 kg) BMI (Calculated): 29.85 Weight at Last Visit: 176lb Weight Lost Since Last Visit: 2lb Weight Gained Since Last Visit: 0 Starting Weight: 241lb Total Weight Loss (lbs): 67 lb (30.4 kg)   Body Composition  Body Fat %: 39.5 % Fat Mass (lbs): 68.8 lbs Muscle Mass (lbs): 100 lbs Total Body Water (lbs): 69.2 lbs Visceral Fat Rating : 11   Other Clinical Data Fasting: yes Labs: no Today's Visit #: 27 Starting Date: 09/10/17    OBESITY Crystal Roberts is here to discuss her progress with her obesity treatment plan along with follow-up of her obesity related diagnoses.    Nutrition Plan: the Category 2 plan - 90-95% adherence.  Current exercise: walking  Interim History:  She is down an additional 2 lbs.  Eating all of the food on the plan., Protein intake is as prescribed, Is not skipping meals, and Water intake is adequate.   Pharmacotherapy: Crystal Roberts is on Mounjaro  15 mg SQ weekly Adverse side effects: None Hunger is moderately controlled.  Cravings are moderately controlled.  Assessment/Plan:   Type II Diabetes HgbA1c is at goal. Last A1c was 5.6 CBGs: Not checking      Episodes of hypoglycemia: no Medication(s): Mounjaro  15 mg SQ weekly  Lab Results  Component Value Date   HGBA1C 5.6 03/10/2023   HGBA1C 5.6 07/31/2022   HGBA1C 5.8 (H) 12/13/2021   Lab Results  Component Value Date   LDLCALC 84 03/10/2023   CREATININE 0.93 03/10/2023   No results found for: GFR  Plan: Continue and refill Mounjaro  15 mg SQ weekly Continue all other medications.  Will keep all carbohydrates low both sweets and  starches.  Will continue exercise regimen to 30 to 60 minutes on most days of the week.  Aim for 7 to 9 hours of sleep nightly.  Eat more low glycemic index foods.  She will take her lunch to work  She will continue to do meal planning.     Generalized Obesity: Current BMI BMI (Calculated): 29.85   Pharmacotherapy Plan Continue and refill  Mounjaro  15 mg SQ weekly  Crystal Roberts is currently in the action stage of change. As such, her goal is to continue with weight loss efforts.  She has agreed to the Category 2 plan.  Exercise goals: Older adults should determine their level of effort for physical activity relative to their level of fitness.   Behavioral modification strategies: better snacking choices, planning for success, increasing vegetables, get rid of junk food in the home, avoiding temptations, keep healthy foods in the home, weigh protein portions, measure portion sizes, and mindful eating.  Crystal Roberts has agreed to follow-up with our clinic in 4 weeks.    Objective:   VITALS: Per patient if applicable, see vitals. GENERAL: Alert and in no acute distress. CARDIOPULMONARY: No increased WOB. Speaking in clear sentences.  PSYCH: Pleasant and cooperative. Speech normal rate and rhythm. Affect is appropriate. Insight and judgement are appropriate. Attention is focused, linear, and appropriate.  NEURO: Oriented as arrived to appointment on time with no prompting.   Attestation Statements:   This was prepared with the assistance of Engineer, civil (consulting).  Occasional wrong-word or sound-a-like substitutions may have occurred due to the inherent limitations of voice recognition   Crystal Daring, DO

## 2023-09-01 ENCOUNTER — Other Ambulatory Visit (HOSPITAL_COMMUNITY): Payer: Self-pay

## 2023-09-04 ENCOUNTER — Other Ambulatory Visit (HOSPITAL_COMMUNITY): Payer: Self-pay

## 2023-09-17 ENCOUNTER — Encounter: Payer: Self-pay | Admitting: Bariatrics

## 2023-09-17 ENCOUNTER — Other Ambulatory Visit (HOSPITAL_COMMUNITY): Payer: Self-pay

## 2023-09-17 ENCOUNTER — Ambulatory Visit (INDEPENDENT_AMBULATORY_CARE_PROVIDER_SITE_OTHER): Admitting: Bariatrics

## 2023-09-17 VITALS — BP 153/84 | HR 66 | Temp 97.6°F | Ht 64.0 in | Wt 180.0 lb

## 2023-09-17 DIAGNOSIS — I10 Essential (primary) hypertension: Secondary | ICD-10-CM

## 2023-09-17 DIAGNOSIS — Z6830 Body mass index (BMI) 30.0-30.9, adult: Secondary | ICD-10-CM

## 2023-09-17 DIAGNOSIS — E038 Other specified hypothyroidism: Secondary | ICD-10-CM | POA: Diagnosis not present

## 2023-09-17 DIAGNOSIS — Z7985 Long-term (current) use of injectable non-insulin antidiabetic drugs: Secondary | ICD-10-CM

## 2023-09-17 DIAGNOSIS — E039 Hypothyroidism, unspecified: Secondary | ICD-10-CM

## 2023-09-17 DIAGNOSIS — E119 Type 2 diabetes mellitus without complications: Secondary | ICD-10-CM

## 2023-09-17 DIAGNOSIS — E1169 Type 2 diabetes mellitus with other specified complication: Secondary | ICD-10-CM | POA: Diagnosis not present

## 2023-09-17 DIAGNOSIS — E538 Deficiency of other specified B group vitamins: Secondary | ICD-10-CM | POA: Diagnosis not present

## 2023-09-17 DIAGNOSIS — E669 Obesity, unspecified: Secondary | ICD-10-CM | POA: Diagnosis not present

## 2023-09-17 DIAGNOSIS — E559 Vitamin D deficiency, unspecified: Secondary | ICD-10-CM

## 2023-09-17 MED ORDER — TIRZEPATIDE 15 MG/0.5ML ~~LOC~~ SOAJ
15.0000 mg | SUBCUTANEOUS | 0 refills | Status: DC
  Filled 2023-09-17 – 2023-10-10 (×4): qty 2, 28d supply, fill #0

## 2023-09-17 NOTE — Progress Notes (Signed)
 WEIGHT SUMMARY AND BIOMETRICS  Weight Lost Since Last Visit: 0  Weight Gained Since Last Visit: 6lb   Vitals Temp: 97.6 F (36.4 C) BP: (!) 153/84 Pulse Rate: 66 SpO2: 98 %   Anthropometric Measurements Height: 5' 4 (1.626 m) Weight: 180 lb (81.6 kg) BMI (Calculated): 30.88 Weight at Last Visit: 174lb Weight Lost Since Last Visit: 0 Weight Gained Since Last Visit: 6lb Starting Weight: 241lb Total Weight Loss (lbs): 61 lb (27.7 kg)   Body Composition  Body Fat %: 41.3 % Fat Mass (lbs): 74.4 lbs Muscle Mass (lbs): 100.2 lbs Total Body Water (lbs): 73.6 lbs Visceral Fat Rating : 11   Other Clinical Data Fasting: yes Labs: yes Today's Visit #: 45 Starting Date: 09/10/17    OBESITY Crystal Roberts is here to discuss her progress with her obesity treatment plan along with follow-up of her obesity related diagnoses.    Nutrition Plan: the Category 2 plan - 90-95% adherence.  Current exercise: walking and house work.  Interim History:  She is up 6 lbs since her last visit. She has had 3 different parties.  Eating all of the food on the plan., Protein intake is as prescribed, Is not skipping meals, and Water intake is adequate.   Pharmacotherapy: Crystal Roberts is on Mounjaro  15 mg SQ weekly Adverse side effects: None Hunger is moderately controlled.  Cravings are moderately controlled.  Assessment/Plan:    Hypertension:   Taking medications as directed.  Blood pressure is slightly elevated today but is usually normal.  Plan: Continue medications as directed. No added salt.   Vitamin D  deficiency:   Taking vitamin D  prescription/OTC  Plan: Continue vitamin D . Will check vitamin D .    B 12 deficiency:   She is not taking B 12. He/She has a history of B12 deficiency.   Plan:  Check B 12 lab today.    Type 2 Diabetes:   She needs labs today. Her  HgbA1c was at goal at the last lab draw. She is taking medications as directed.   Plan: Will check HgbA1c and insulin . Will continue to work on the plan and exercise as able.    Hypothyroidism:   Taking medications as directed. Denies any side effects.   Plan: Will check thyroid  panel. Continue medications.    Labs done today (CMP, Lipids, HgbA1c, insulin , vitamin D , B 12, and thyroid  panel).     Generalized Obesity: Current BMI BMI (Calculated): 30.88   Pharmacotherapy Plan Continue and refill  Mounjaro  15 mg SQ weekly  Crystal Roberts is currently in the action stage of change. As such, her goal is to continue with weight loss efforts.  She has agreed to the Category 2 plan.  Exercise goals: Older adults should follow the adult guidelines. When older adults cannot meet the adult guidelines, they should be as physically active as their abilities and conditions will allow.  Behavioral modification strategies: increasing lean protein intake, no meal skipping, meal planning , increase water intake, better snacking choices, planning for success, increasing vegetables, keep healthy foods in the home, and mindful eating.  Crystal Roberts has agreed to follow-up with our clinic in 4 weeks.      Objective:   VITALS: Per patient if applicable, see vitals. GENERAL: Alert and in no acute distress. CARDIOPULMONARY: No increased WOB. Speaking in clear sentences.  PSYCH: Pleasant and cooperative. Speech normal rate and rhythm. Affect is appropriate. Insight and judgement are appropriate. Attention is focused, linear, and appropriate.  NEURO: Oriented as arrived to appointment on time with no prompting.   Attestation Statements:   This was prepared with the assistance of Engineer, civil (consulting).  Occasional wrong-word or sound-a-like substitutions may have occurred due to the inherent limitations of voice recognition   Clayborne Daring, DO

## 2023-09-18 LAB — COMPREHENSIVE METABOLIC PANEL WITH GFR
ALT: 12 IU/L (ref 0–32)
AST: 11 IU/L (ref 0–40)
Albumin: 4.4 g/dL (ref 3.9–4.9)
Alkaline Phosphatase: 93 IU/L (ref 44–121)
BUN/Creatinine Ratio: 22 (ref 12–28)
BUN: 18 mg/dL (ref 8–27)
Bilirubin Total: 0.4 mg/dL (ref 0.0–1.2)
CO2: 22 mmol/L (ref 20–29)
Calcium: 9.6 mg/dL (ref 8.7–10.3)
Chloride: 105 mmol/L (ref 96–106)
Creatinine, Ser: 0.82 mg/dL (ref 0.57–1.00)
Globulin, Total: 2.4 g/dL (ref 1.5–4.5)
Glucose: 97 mg/dL (ref 70–99)
Potassium: 4.7 mmol/L (ref 3.5–5.2)
Sodium: 141 mmol/L (ref 134–144)
Total Protein: 6.8 g/dL (ref 6.0–8.5)
eGFR: 79 mL/min/1.73 (ref >59)

## 2023-09-18 LAB — HEMOGLOBIN A1C
Est. average glucose Bld gHb Est-mCnc: 117 mg/dL
Hgb A1c MFr Bld: 5.7 % — ABNORMAL HIGH (ref 4.8–5.6)

## 2023-09-18 LAB — LIPID PANEL WITH LDL/HDL RATIO
Cholesterol, Total: 195 mg/dL (ref 100–199)
HDL: 71 mg/dL (ref >39)
LDL Chol Calc (NIH): 109 mg/dL — ABNORMAL HIGH (ref 0–99)
LDL/HDL Ratio: 1.5 ratio (ref 0.0–3.2)
Triglycerides: 84 mg/dL (ref 0–149)
VLDL Cholesterol Cal: 15 mg/dL (ref 5–40)

## 2023-09-18 LAB — TSH+T4F+T3FREE
Free T4: 1.12 ng/dL (ref 0.82–1.77)
T3, Free: 2.9 pg/mL (ref 2.0–4.4)
TSH: 3.95 u[IU]/mL (ref 0.450–4.500)

## 2023-09-18 LAB — INSULIN, RANDOM: INSULIN: 24.1 u[IU]/mL (ref 2.6–24.9)

## 2023-09-18 LAB — VITAMIN D 25 HYDROXY (VIT D DEFICIENCY, FRACTURES): Vit D, 25-Hydroxy: 44.5 ng/mL (ref 30.0–100.0)

## 2023-09-18 LAB — VITAMIN B12: Vitamin B-12: 335 pg/mL (ref 232–1245)

## 2023-10-10 ENCOUNTER — Other Ambulatory Visit (HOSPITAL_COMMUNITY): Payer: Self-pay

## 2023-10-10 ENCOUNTER — Encounter: Payer: Self-pay | Admitting: Pharmacist

## 2023-10-10 ENCOUNTER — Other Ambulatory Visit: Payer: Self-pay

## 2023-10-20 ENCOUNTER — Ambulatory Visit: Admitting: Bariatrics

## 2023-11-13 ENCOUNTER — Other Ambulatory Visit (HOSPITAL_COMMUNITY): Payer: Self-pay | Admitting: Family Medicine

## 2023-11-13 ENCOUNTER — Encounter: Payer: Self-pay | Admitting: Bariatrics

## 2023-11-13 ENCOUNTER — Ambulatory Visit (INDEPENDENT_AMBULATORY_CARE_PROVIDER_SITE_OTHER): Admitting: Bariatrics

## 2023-11-13 ENCOUNTER — Other Ambulatory Visit (HOSPITAL_COMMUNITY): Payer: Self-pay

## 2023-11-13 VITALS — BP 127/75 | HR 62 | Temp 97.9°F | Ht 64.0 in | Wt 180.0 lb

## 2023-11-13 DIAGNOSIS — Z7985 Long-term (current) use of injectable non-insulin antidiabetic drugs: Secondary | ICD-10-CM | POA: Diagnosis not present

## 2023-11-13 DIAGNOSIS — E119 Type 2 diabetes mellitus without complications: Secondary | ICD-10-CM

## 2023-11-13 DIAGNOSIS — E65 Localized adiposity: Secondary | ICD-10-CM

## 2023-11-13 DIAGNOSIS — E669 Obesity, unspecified: Secondary | ICD-10-CM

## 2023-11-13 DIAGNOSIS — Z6830 Body mass index (BMI) 30.0-30.9, adult: Secondary | ICD-10-CM | POA: Diagnosis not present

## 2023-11-13 DIAGNOSIS — Z1231 Encounter for screening mammogram for malignant neoplasm of breast: Secondary | ICD-10-CM

## 2023-11-13 DIAGNOSIS — E6609 Other obesity due to excess calories: Secondary | ICD-10-CM

## 2023-11-13 MED ORDER — TIRZEPATIDE 15 MG/0.5ML ~~LOC~~ SOAJ
15.0000 mg | SUBCUTANEOUS | 0 refills | Status: DC
  Filled 2023-11-13: qty 2, 28d supply, fill #0

## 2023-11-13 NOTE — Progress Notes (Signed)
 WEIGHT SUMMARY AND BIOMETRICS  Weight Lost Since Last Visit: 0  Weight Gained Since Last Visit: 0   Vitals Temp: 97.9 F (36.6 C) BP: 127/75 Pulse Rate: 62 SpO2: 99 %   Anthropometric Measurements Height: 5' 4 (1.626 m) Weight: 180 lb (81.6 kg) BMI (Calculated): 30.88 Weight at Last Visit: 180lb Weight Lost Since Last Visit: 0 Weight Gained Since Last Visit: 0 Starting Weight: 241lb Total Weight Loss (lbs): 61 lb (27.7 kg)   Body Composition  Body Fat %: 41.1 % Fat Mass (lbs): 74.2 lbs Muscle Mass (lbs): 100.8 lbs Total Body Water (lbs): 78.4 lbs Visceral Fat Rating : 11   Other Clinical Data Fasting: no Labs: no Today's Visit #: 61 Starting Date: 09/10/17    OBESITY Crystal Roberts is here to discuss her progress with her obesity treatment plan along with follow-up of her obesity related diagnoses.    Nutrition Plan: the Category 2 plan - 50% adherence.  Current exercise: walking  Interim History:  Her weight remains the same.  Eating all of the food on the plan., Protein intake is as prescribed, and Water intake is adequate.   Pharmacotherapy: Avarose is on Mounjaro  15 mg SQ weekly Adverse side effects: None Hunger is moderately controlled.  Cravings are moderately controlled.  Assessment/Plan:   Type II Diabetes HgbA1c is at goal. Last A1c was 5.7 Episodes of hypoglycemia: no Medication(s): Mounjaro  15 mg SQ weekly  Lab Results  Component Value Date   HGBA1C 5.7 (H) 09/17/2023   HGBA1C 5.6 03/10/2023   HGBA1C 5.6 07/31/2022   Lab Results  Component Value Date   LDLCALC 109 (H) 09/17/2023   CREATININE 0.82 09/17/2023   No results found for: GFR  Plan: Continue and refill Mounjaro  15 mg SQ weekly Continue all other medications.  Will keep all carbohydrates low both sweets and starches.  Will continue exercise regimen to 30 to 60  minutes on most days of the week.  Aim for 7 to 9 hours of sleep nightly.  Eat more low glycemic index foods.  Will cook more at home.  She will continue to do meal planning.   Abdominal pannus:  She has a pannus that is subject to irritation and infection. She is using various products such as powder, and OTC medication for candidiasis but without relief.   Plan:  Referral to plastic surgery ([facility]) to consider a panniculectomy.      Generalized Obesity: Current BMI BMI (Calculated): 30.88   Pharmacotherapy Plan Continue and refill  Mounjaro  15 mg SQ weekly  Crystal Roberts is currently in the action stage of change. As such, her goal is to continue with weight loss efforts.  She has agreed to the Category 2 plan.  Exercise goals: Older adults should follow the adult guidelines. When older adults cannot meet the adult guidelines, they should be as physically active as their abilities and  conditions will allow.   Behavioral modification strategies: increasing lean protein intake, no meal skipping, meal planning , increase water intake, better snacking choices, planning for success, avoiding temptations, keep healthy foods in the home, weigh protein portions, measure portion sizes, and mindful eating.  Crystal Roberts has agreed to follow-up with our clinic in 4 weeks.     Objective:   VITALS: Per patient if applicable, see vitals. GENERAL: Alert and in no acute distress. CARDIOPULMONARY: No increased WOB. Speaking in clear sentences.  PSYCH: Pleasant and cooperative. Speech normal rate and rhythm. Affect is appropriate. Insight and judgement are appropriate. Attention is focused, linear, and appropriate.  NEURO: Oriented as arrived to appointment on time with no prompting.   Attestation Statements:   This was prepared with the assistance of Engineer, Civil (consulting).  Occasional wrong-word or sound-a-like substitutions may have occurred due to the inherent limitations of voice recognition   Clayborne Daring, DO

## 2023-11-26 ENCOUNTER — Ambulatory Visit: Admitting: Plastic Surgery

## 2023-11-26 VITALS — BP 129/78 | HR 66 | Ht 64.0 in | Wt 188.6 lb

## 2023-11-26 DIAGNOSIS — E65 Localized adiposity: Secondary | ICD-10-CM | POA: Diagnosis not present

## 2023-11-26 DIAGNOSIS — M793 Panniculitis, unspecified: Secondary | ICD-10-CM

## 2023-11-26 DIAGNOSIS — R21 Rash and other nonspecific skin eruption: Secondary | ICD-10-CM

## 2023-11-26 NOTE — Progress Notes (Signed)
 Referring Provider Marvine Rush, MD [address] [place],  [place] [address]   CC:  Chief Complaint  Patient presents with   Advice Only      Crystal Roberts is an 68 y.o. female.  HPI: Ms. Hohn is a 68 year old female who is referred for removal of her anterior abdominal wall pannus.  Patient has had a significant weight loss of close to 90 pounds.  She now has excess skin and fat which frequently becomes infected with rashes and will have a an objectionable odor if she has been sweating.  It is difficult for her to wear her close appropriately.  She is interested in a panniculectomy.  She is prediabetic but this is controlled with her last hemoglobin A1c less than 6.  She does take an aspirin  each day.  She has a very remote history of a DVT in 1986.  She has not been treated for this since the initial incident.  Allergies  Allergen Reactions   Diclofenac Itching    causes itching   Sulfa Antibiotics Itching and Rash    Outpatient Encounter Medications as of 11/26/2023  Medication Sig Note   amLODipine  (NORVASC ) 10 MG tablet Take 1 tablet (10 mg total) by mouth at bedtime.    aspirin  81 MG tablet Take 81 mg by mouth daily.    buPROPion  (WELLBUTRIN  XL) 300 MG 24 hr tablet Take 1 tablet (300 mg total) by mouth daily.    Cholecalciferol (VITAMIN D ) 50 MCG (2000 UT) CAPS Take by mouth.    cyclobenzaprine  (FLEXERIL ) 10 MG tablet Take 10 mg by mouth 3 (three) times daily as needed.    diazepam (VALIUM) 2 MG tablet Take 2 mg by mouth 2 (two) times daily.    FLUoxetine  (PROZAC ) 20 MG capsule Take 1 capsule (20 mg total) by mouth every morning.    furosemide (LASIX) 40 MG tablet Take 40 mg by mouth daily. (Patient taking differently: Take 40 mg by mouth daily.)    levothyroxine  (SYNTHROID ) 75 MCG tablet Take 1 tablet (75 mcg total) by mouth daily.    losartan -hydrochlorothiazide  (HYZAAR ) 100-25 MG tablet Take 1 tablet by mouth daily.    meloxicam  (MOBIC ) 15 MG tablet Take 1 tablet (15  mg total) by mouth daily.    Multiple Vitamin (MULTIVITAMIN) tablet Take 1 tablet by mouth daily.    Polyethyl Glycol-Propyl Glycol 0.4-0.3 % SOLN Place 1 drop into both eyes 3 (three) times daily as needed (for itchy/dry eyes.).    rosuvastatin  (CRESTOR ) 20 MG tablet Take 1 tablet (20 mg total) by mouth daily.    tirzepatide  (MOUNJARO ) 15 MG/0.5ML Pen Inject 15 mg into the skin once a week.    CINNAMON  PO Take 500 mg by mouth at bedtime.    [DISCONTINUED] simvastatin  (ZOCOR ) 40 MG tablet Take 1 tablet (40 mg total) by mouth at bedtime. 02/21/2021: changed to crestor  per md   No facility-administered encounter medications on file as of 11/26/2023.     Past Medical History:  Diagnosis Date   Anxiety    Arthritis    neck- cervical disc herniation    Asthma    as child   Back pain    Cancer (HCC)    skin - low back , insitu  melanoma    Constipation    COVID-19 09/07/2019   sx fever/cough/chills/lost taste and smell   Depression    Diabetes mellitus without complication (HCC)    Dyspnea    Gallbladder problem  GERD (gastroesophageal reflux disease)    H/O cardiovascular stress test 1990   pt. reports that she was having a lot of stress & she was seen by cardiac- had stress test & told that it was negative    Hyperlipidemia    Hypertension    Hypothyroidism    Leg edema    Pulmonary embolism (HCC) 1986   Hosp. at [facility]- on blood thinning med., thought to be related to lack of activity, smoking & BCP    Past Surgical History:  Procedure Laterality Date   ANTERIOR CERVICAL DECOMP/DISCECTOMY FUSION N/A 08/16/2016   Procedure: Cervical Four-Five Anterior cervical decompression/discectomy/fusion;  Surgeon: Unice Pac, MD;  Location: [facility] OR;  Service: Neurosurgery;  Laterality: N/A;  C4-5 Anterior cervical decompression/discectomy/fusion   BACK SURGERY     sebaceous cyst removed   CHOLECYSTECTOMY  1987   umbilical hernia ------ followed cholecystectomy   Cyst removed from back      CYSTECTOMY Bilateral    cyst on ovaries   HERNIA REPAIR     umbilical hernia repair    MASS EXCISION N/A 11/21/2017   Procedure: EXCISION SEBACEOUS CYST ON BACK;  Surgeon: Mavis Anes, MD;  Location: [facility] ORS;  Service: General;  Laterality: N/A;   SHOULDER ARTHROSCOPY WITH ROTATOR CUFF REPAIR AND SUBACROMIAL DECOMPRESSION Right 09/23/2019   Procedure: RIGHT SHOULDER ARTHROSCOPY DEBRIDEMENT, ACROMIOPLASTY, DISTAL CLAVICAL EXCISION ROTATOR CUFF REPAIR;  Surgeon: Cristy Bonner DASEN, MD;  Location: [facility];  Service: Orthopedics;  Laterality: Right;   TUBAL LIGATION      Family History  Problem Relation Age of Onset   Heart disease Mother    Hypertension Mother    Hyperlipidemia Mother    Cancer Father        melenoma   Diabetes Sister    Diabetes Sister    Cancer Sister        kidney; had kidney removed   Stroke Brother    Heart disease Brother    Breast cancer Maternal Aunt    Diabetes Paternal Grandmother    Colon polyps Cousin    Colon cancer Neg Hx    Esophageal cancer Neg Hx    Rectal cancer Neg Hx    Stomach cancer Neg Hx     Social History   Social History Narrative   Not on file     Review of Systems General: Denies fevers, chills, weight loss CV: Denies chest pain, shortness of breath, palpitations Abdomen: Excess skin and fat on the anterior abdominal wall  Physical Exam    11/26/2023    1:49 PM 11/13/2023    4:00 PM 09/17/2023    7:58 AM  Vitals with BMI  Height 5' 4 5' 4   Weight 188 lbs 10 oz 180 lbs   BMI 32.36 30.88   Systolic 129 127 846  Diastolic 78 75 84  Pulse 66 62     General:  No acute distress,  Alert and oriented, Non-Toxic, Normal speech and affect Abdomen: Has noted excess skin and fat on the anterior abdominal wall.  The pannus hangs well past the symphysis pubis and does cover both inguinal creases.  She has a well-healed subcostal incision on the right.  No palpable ventral or umbilical hernia. Mammogram: November 2024  BI-RADS 1 Assessment/Plan Pannus: Patient has excess skin of fat on the anterior abdominal wall which frequently becomes infected.  I believe she would be a good candidate for panniculectomy.  I showed her the location of the incisions and  we discussed the unpredictable nature of scarring and wound healing.  Specifically we discussed the increased risk of wound separation and wound infection with panniculectomy's.  We also discussed the risk of bleeding, infection, and seroma formation.  She understands that she will have 2 drains which will be in place for up to 4 weeks.  She will also need to wear compressive garment for 6 weeks postoperatively.  We discussed the postoperative limitations including no heavy lifting greater than 20 pounds, no vigorous activity, no submerging the incisions in water for 6 weeks.  She will be encouraged to begin ambulation immediately after surgery to decrease the risk of DVT.  She may return to light activity as tolerated.  All questions were answered to her satisfaction.  Photographs were obtained today with her consent.  Will submit her for a panniculectomy at her request.  Will obtain clearance from her primary care doctor.  Given the isolated nature of her DVT it is unlikely that she will need perioperative anticoagulation however if it is felt that she does then she will no longer be a candidate for a panniculectomy; she understands this.  Leonce KATHEE [place] 11/26/2023, 3:49 PM

## 2023-12-01 ENCOUNTER — Telehealth: Payer: Self-pay

## 2023-12-01 NOTE — Telephone Encounter (Signed)
 Surgical clearance sent to Dr. Gordy [facility] requesting DVT risk stratification and recommendations. Confirmation of receipt.

## 2023-12-03 ENCOUNTER — Encounter (HOSPITAL_COMMUNITY): Payer: Self-pay

## 2023-12-03 ENCOUNTER — Ambulatory Visit (HOSPITAL_COMMUNITY)
Admission: RE | Admit: 2023-12-03 | Discharge: 2023-12-03 | Disposition: A | Discharge status: Home / Self Care | Source: Ambulatory Visit | Attending: Family Medicine | Admitting: Family Medicine

## 2023-12-03 DIAGNOSIS — Z1231 Encounter for screening mammogram for malignant neoplasm of breast: Secondary | ICD-10-CM | POA: Insufficient documentation

## 2023-12-08 ENCOUNTER — Other Ambulatory Visit (HOSPITAL_COMMUNITY): Payer: Self-pay

## 2023-12-08 ENCOUNTER — Ambulatory Visit (INDEPENDENT_AMBULATORY_CARE_PROVIDER_SITE_OTHER): Admitting: Bariatrics

## 2023-12-08 ENCOUNTER — Encounter: Payer: Self-pay | Admitting: Bariatrics

## 2023-12-08 VITALS — BP 147/75 | HR 65 | Temp 98.0°F | Ht 64.0 in | Wt 180.0 lb

## 2023-12-08 DIAGNOSIS — E039 Hypothyroidism, unspecified: Secondary | ICD-10-CM

## 2023-12-08 DIAGNOSIS — E66811 Obesity, class 1: Secondary | ICD-10-CM

## 2023-12-08 DIAGNOSIS — E119 Type 2 diabetes mellitus without complications: Secondary | ICD-10-CM

## 2023-12-08 DIAGNOSIS — Z7985 Long-term (current) use of injectable non-insulin antidiabetic drugs: Secondary | ICD-10-CM

## 2023-12-08 DIAGNOSIS — Z6830 Body mass index (BMI) 30.0-30.9, adult: Secondary | ICD-10-CM | POA: Diagnosis not present

## 2023-12-08 DIAGNOSIS — E669 Obesity, unspecified: Secondary | ICD-10-CM

## 2023-12-08 DIAGNOSIS — E038 Other specified hypothyroidism: Secondary | ICD-10-CM

## 2023-12-08 MED ORDER — TIRZEPATIDE 15 MG/0.5ML ~~LOC~~ SOAJ
15.0000 mg | SUBCUTANEOUS | 0 refills | Status: DC
  Filled 2023-12-08 – 2023-12-09 (×2): qty 2, 28d supply, fill #0

## 2023-12-08 NOTE — Progress Notes (Signed)
 WEIGHT SUMMARY AND BIOMETRICS  Weight Lost Since Last Visit: 0lb  Weight Gained Since Last Visit: 0lb   Vitals Temp: 98 F (36.7 C) BP: (!) 147/75 Pulse Rate: 65 SpO2: 97 %   Anthropometric Measurements Height: 5' 4 (1.626 m) Weight: 180 lb (81.6 kg) BMI (Calculated): 30.88 Weight at Last Visit: 180lb Weight Lost Since Last Visit: 0lb Weight Gained Since Last Visit: 0lb Starting Weight: 241lb Total Weight Loss (lbs): 61 lb (27.7 kg)   Body Composition  Body Fat %: 41.7 % Fat Mass (lbs): 75.2 lbs Muscle Mass (lbs): 99.6 lbs Total Body Water (lbs): 74.8 lbs Visceral Fat Rating : 11   Other Clinical Data Fasting: No Labs: no Today's Visit #: 82 Starting Date: 09/10/17    OBESITY Crystal Roberts is here to discuss her progress with her obesity treatment plan along with follow-up of her obesity related diagnoses.    Nutrition Plan: the Category 2 plan - 60-80% adherence.  Current exercise: walking  Interim History:  Her weight remains the same.  Eating all of the food on the plan., Protein intake is as prescribed, Not journaling consistently., and Water intake is adequate.   Pharmacotherapy: Crystal Roberts is on Mounjaro  15 mg SQ weekly Adverse side effects: None Hunger is moderately controlled.  Cravings are moderately controlled.  Assessment/Plan:   Type II Diabetes HgbA1c is at goal. Last A1c was 5.7 CBGs: rarely, but if checked around 90's      Episodes of hypoglycemia: no Medication(s): Mounjaro  15 mg SQ weekly  Lab Results  Component Value Date   HGBA1C 5.7 (H) 09/17/2023   HGBA1C 5.6 03/10/2023   HGBA1C 5.6 07/31/2022   Lab Results  Component Value Date   LDLCALC 109 (H) 09/17/2023   CREATININE 0.82 09/17/2023   No results found for: GFR  Plan: Continue and refill Mounjaro  15 mg SQ weekly Continue all other medications.  Will keep all  carbohydrates low both sweets and starches.  Will continue exercise regimen to 30 to 60 minutes on most days of the week. Will add in more resistance.   Hypothyroidism Stable.  Does not report symptoms associated with uncontrolled hypothyroidism. Medication(s): Levothyroxine  75 mcg daily. Lab Results  Component Value Date   TSH 3.950 09/17/2023    Plan: Continue levothyroxine  at current dose. Counseling: The correct way to take levothyroxine  is fasting, with water, separated by at least 30 minutes from breakfast, and separated by more than 4 hours from calcium , iron, multivitamins, acid reflux medications (PPIs).      Generalized Obesity: Current BMI BMI (Calculated): 30.88   Pharmacotherapy Plan Continue and refill  Mounjaro  15 mg SQ weekly  Crystal Roberts is currently in the action stage of change. As such, her goal is to continue with weight loss efforts.  She has agreed to the Category 2 plan.  Exercise goals: Older adults should determine their level of effort  for physical activity relative to their level of fitness.   Behavioral modification strategies: increasing lean protein intake, decreasing simple carbohydrates , no meal skipping, meal planning , increase water intake, better snacking choices, planning for success, increasing vegetables, increasing fiber rich foods, avoiding temptations, keep healthy foods in the home, weigh protein portions, measure portion sizes, and mindful eating.  Crystal Roberts has agreed to follow-up with our clinic in 4 weeks.   Objective:   VITALS: Per patient if applicable, see vitals. GENERAL: Alert and in no acute distress. CARDIOPULMONARY: No increased WOB. Speaking in clear sentences.  PSYCH: Pleasant and cooperative. Speech normal rate and rhythm. Affect is appropriate. Insight and judgement are appropriate. Attention is focused, linear, and appropriate.  NEURO: Oriented as arrived to appointment on time with no prompting.   Attestation Statements:    This was prepared with the assistance of Engineer, Civil (consulting).  Occasional wrong-word or sound-a-like substitutions may have occurred due to the inherent limitations of voice recognition.  Clayborne Daring, DO

## 2023-12-09 ENCOUNTER — Other Ambulatory Visit: Payer: Self-pay

## 2023-12-09 ENCOUNTER — Other Ambulatory Visit (HOSPITAL_COMMUNITY): Payer: Self-pay

## 2023-12-09 ENCOUNTER — Telehealth (HOSPITAL_COMMUNITY): Payer: Self-pay | Admitting: Pharmacy Technician

## 2023-12-09 ENCOUNTER — Telehealth (HOSPITAL_COMMUNITY): Payer: Self-pay

## 2023-12-09 ENCOUNTER — Encounter (HOSPITAL_COMMUNITY): Payer: Self-pay

## 2023-12-09 NOTE — Telephone Encounter (Signed)
 Pharmacy Patient Advocate Encounter   Received notification from Patient Pharmacy/pt calls msgs that prior authorization for Mounjaro  15mg  is due for renewal.   Insurance verification completed.   The patient is insured through [facility] [employer].  Action: PA required; PA submitted to above mentioned insurance via Latent Key/confirmation #/EOC [id number] Status is pending

## 2023-12-09 NOTE — Telephone Encounter (Signed)
 PA request has been Received. New Encounter has been or will be created for follow up. For additional info see Pharmacy Prior Auth telephone encounter from 12/09/23.

## 2023-12-09 NOTE — Telephone Encounter (Signed)
 Pharmacy Patient Advocate Encounter  Received notification from [employer] that Prior Authorization for Mounjaro  15mg  has been APPROVED from 12/09/23 to 12/08/24. Ran test claim, Copay is $0. This test claim was processed through [facility]- copay amounts may vary at other pharmacies due to pharmacy/plan contracts, or as the patient moves through the different stages of their insurance plan.   PA #/Case ID/Reference #: [id number]

## 2024-01-05 ENCOUNTER — Other Ambulatory Visit (HOSPITAL_COMMUNITY): Payer: Self-pay

## 2024-01-05 ENCOUNTER — Encounter: Admitting: Bariatrics

## 2024-01-05 ENCOUNTER — Encounter: Payer: Self-pay | Admitting: Bariatrics

## 2024-01-05 LAB — BASIC METABOLIC PANEL WITH GFR
BUN: 24 — AB (ref 4–21)
CO2: 27 — AB (ref 13–22)
Chloride: 105 (ref 99–108)
Creatinine: 0.9 (ref 0.5–1.1)
Glucose: 88
Potassium: 4.2 meq/L (ref 3.5–5.1)
Sodium: 141 (ref 137–147)

## 2024-01-05 LAB — LIPID PANEL
Cholesterol: 174 (ref 0–200)
LDL Cholesterol: 81
Triglycerides: 71 (ref 40–160)

## 2024-01-05 LAB — TSH
TSH: 0.74 (ref 0.41–5.90)
TSH: 0.74 (ref 0.41–5.90)

## 2024-01-05 LAB — MICROALBUMIN / CREATININE URINE RATIO
Creatinine, POC: 180 mg/dL
Microalb Creat Ratio: 10.4
Microalbumin, Urine: 1.86

## 2024-01-05 LAB — HEPATIC FUNCTION PANEL
ALT: 11 U/L (ref 7–35)
AST: 14 (ref 13–35)

## 2024-01-05 LAB — HEMOGLOBIN A1C
A1c: 5.4
Hemoglobin A1C: 5.4

## 2024-01-05 LAB — COMPREHENSIVE METABOLIC PANEL WITH GFR
Albumin: 4.3 (ref 3.5–5.0)
Calcium: 9.8 (ref 8.7–10.7)

## 2024-01-05 MED ORDER — LEVOTHYROXINE SODIUM 75 MCG PO TABS
75.0000 ug | ORAL_TABLET | ORAL | 1 refills | Status: AC
  Filled 2024-01-05: qty 90, 90d supply, fill #0

## 2024-01-05 MED ORDER — FLUOXETINE HCL 20 MG PO CAPS
20.0000 mg | ORAL_CAPSULE | Freq: Every day | ORAL | 1 refills | Status: AC
  Filled 2024-01-05: qty 90, 90d supply, fill #0

## 2024-01-05 MED ORDER — BUPROPION HCL ER (XL) 300 MG PO TB24
300.0000 mg | ORAL_TABLET | ORAL | 1 refills | Status: AC
  Filled 2024-01-05: qty 90, 90d supply, fill #0

## 2024-01-05 MED ORDER — ROSUVASTATIN CALCIUM 20 MG PO TABS
20.0000 mg | ORAL_TABLET | Freq: Every day | ORAL | 1 refills | Status: AC
  Filled 2024-01-05: qty 90, 90d supply, fill #0

## 2024-01-05 MED ORDER — AMLODIPINE BESYLATE 10 MG PO TABS
10.0000 mg | ORAL_TABLET | Freq: Every day | ORAL | 1 refills | Status: AC
  Filled 2024-01-05: qty 90, 90d supply, fill #0

## 2024-01-05 NOTE — Progress Notes (Unsigned)
" ° ° °                                                                                                     °    ° °  WEIGHT SUMMARY AND BIOMETRICS  No data recorded No data recorded  No data recorded Anthropometric Measurements Height: 5' 4 (1.626 m) Weight at Last Visit: 180lb Starting Weight: 241lb   No data recorded Other Clinical Data Fasting: no Labs: no Today's Visit #: 48 Starting Date: 09/10/17    OBESITY Crystal Roberts is here to discuss her progress with her obesity treatment plan along with follow-up of her obesity related diagnoses.    Nutrition Plan: the Category 2 plan - ***% adherence.  Current exercise: {exercise types:16438}  Interim History:  *** {aabnutritionassessment:29213}   Pharmacotherapy: Crystal Roberts is on {dwwpharmacotherapy:29109} Adverse side effects: {dwwse:29122} Hunger is {EWCONTROLASSESSMENT:24261}.  Cravings are {EWCONTROLASSESSMENT:24261}.  Assessment/Plan:   There are no diagnoses linked to this encounter.    {dwwmorbid:29108::Morbid Obesity}: Current BMI No data recorded  Pharmacotherapy Plan {dwwmed:29123}  {dwwpharmacotherapy:29109}  Crystal Roberts {CHL AMB IS/IS NOT:210130109} currently in the action stage of change. As such, her goal is to {MWMwtloss#1:210800005}.  She has agreed to {dwwsldiets:29085}.  Exercise goals: {MWM EXERCISE RECS:23473}  Behavioral modification strategies: {dwwslwtlossstrategies:29088}.  Crystal Roberts has agreed to follow-up with our clinic in {NUMBER 1-10:22536} weeks.   No orders of the defined types were placed in this encounter.   There are no discontinued medications.   No orders of the defined types were placed in this encounter.     Objective:   VITALS: Per patient if applicable, see vitals. GENERAL: Alert and in no acute distress. CARDIOPULMONARY: No increased WOB. Speaking in clear sentences.  PSYCH: Pleasant and cooperative. Speech normal rate and rhythm. Affect is appropriate. Insight and judgement are  appropriate. Attention is focused, linear, and appropriate.  NEURO: Oriented as arrived to appointment on time with no prompting.   Attestation Statements:   This was prepared with the assistance of Engineer, Civil (consulting).  Occasional wrong-word or sound-a-like substitutions may have occurred due to the inherent limitations of voice recognition   "

## 2024-01-06 ENCOUNTER — Encounter: Payer: Self-pay | Admitting: Bariatrics

## 2024-01-06 ENCOUNTER — Other Ambulatory Visit (HOSPITAL_COMMUNITY): Payer: Self-pay

## 2024-01-06 LAB — COMPREHENSIVE METABOLIC PANEL (CC13): EGFR: 70

## 2024-01-06 MED ORDER — VALACYCLOVIR HCL 1 G PO TABS
1000.0000 mg | ORAL_TABLET | Freq: Two times a day (BID) | ORAL | 0 refills | Status: DC
  Filled 2024-01-06: qty 14, 7d supply, fill #0

## 2024-01-06 NOTE — Progress Notes (Signed)
 This encounter was created in error - please disregard.

## 2024-01-07 ENCOUNTER — Other Ambulatory Visit (HOSPITAL_COMMUNITY): Payer: Self-pay

## 2024-01-19 ENCOUNTER — Encounter: Payer: Self-pay | Admitting: Bariatrics

## 2024-01-19 ENCOUNTER — Other Ambulatory Visit (HOSPITAL_COMMUNITY): Payer: Self-pay

## 2024-01-19 ENCOUNTER — Other Ambulatory Visit: Payer: Self-pay | Admitting: Family Medicine

## 2024-01-19 ENCOUNTER — Ambulatory Visit: Admitting: Bariatrics

## 2024-01-19 VITALS — BP 144/76 | HR 85 | Ht 64.0 in | Wt 180.0 lb

## 2024-01-19 DIAGNOSIS — E119 Type 2 diabetes mellitus without complications: Secondary | ICD-10-CM

## 2024-01-19 DIAGNOSIS — E1169 Type 2 diabetes mellitus with other specified complication: Secondary | ICD-10-CM

## 2024-01-19 DIAGNOSIS — Z7985 Long-term (current) use of injectable non-insulin antidiabetic drugs: Secondary | ICD-10-CM

## 2024-01-19 DIAGNOSIS — E785 Hyperlipidemia, unspecified: Secondary | ICD-10-CM

## 2024-01-19 DIAGNOSIS — E66811 Obesity, class 1: Secondary | ICD-10-CM

## 2024-01-19 DIAGNOSIS — Z6830 Body mass index (BMI) 30.0-30.9, adult: Secondary | ICD-10-CM | POA: Diagnosis not present

## 2024-01-19 DIAGNOSIS — E669 Obesity, unspecified: Secondary | ICD-10-CM

## 2024-01-19 MED ORDER — TIRZEPATIDE 15 MG/0.5ML ~~LOC~~ SOAJ
15.0000 mg | SUBCUTANEOUS | 0 refills | Status: AC
  Filled 2024-01-19: qty 2, 28d supply, fill #0

## 2024-01-19 MED ORDER — VALACYCLOVIR HCL 1 G PO TABS
1000.0000 mg | ORAL_TABLET | Freq: Two times a day (BID) | ORAL | 2 refills | Status: AC
  Filled 2024-01-19: qty 14, 7d supply, fill #0

## 2024-01-19 NOTE — Progress Notes (Signed)
 "                                                                                                             WEIGHT SUMMARY AND BIOMETRICS  Weight Lost Since Last Visit: 0  Weight Gained Since Last Visit: 0   Vitals BP: (!) 144/76 Pulse Rate: 85 SpO2: 98 %   Anthropometric Measurements Height: 5' 4 (1.626 m) Weight: 180 lb (81.6 kg) BMI (Calculated): 30.88 Weight at Last Visit: 180lb Weight Lost Since Last Visit: 0 Weight Gained Since Last Visit: 0 Starting Weight: 241lb Total Weight Loss (lbs): 611 lb (277.1 kg)   Body Composition  Body Fat %: 43.1 % Fat Mass (lbs): 78 lbs Muscle Mass (lbs): 97.6 lbs Total Body Water (lbs): 81.2 lbs Visceral Fat Rating : 12   Other Clinical Data Fasting: no Labs: no Today's Visit #: 48 Starting Date: 09/10/17    OBESITY Crystal Roberts is here to discuss her progress with her obesity treatment plan along with follow-up of her obesity related diagnoses.    Nutrition Plan: the Category 2 plan - 90% adherence.  Current exercise: walking  Interim History:  Her weight remains the same.  Eating all of the food on the plan., Protein intake is as prescribed, Is not skipping meals, and Water intake is adequate.   Pharmacotherapy: Crystal Roberts is on Mounjaro  15 mg SQ weekly Adverse side effects: None Hunger is moderately controlled.  Cravings are moderately controlled.  Assessment/Plan:    Type II Diabetes HgbA1c is at goal. Last A1c was 5.7 CBGs: Not checking  on a regular basis.    Episodes of hypoglycemia: no Medication(s): Mounjaro  15 mg SQ weekly  Lab Results  Component Value Date   HGBA1C 5.7 (H) 09/17/2023   HGBA1C 5.6 03/10/2023   HGBA1C 5.6 07/31/2022   Lab Results  Component Value Date   LDLCALC 109 (H) 09/17/2023   CREATININE 0.82 09/17/2023   No results found for: GFR  Plan: Continue and refill Mounjaro  15 mg SQ weekly Continue all other medications.  Will keep all carbohydrates low both sweets and  starches.  Will continue exercise regimen to 30 to 60 minutes on most days of the week.  Aim for 7 to 9 hours of sleep nightly.  Eat more low glycemic index foods.  Will read labels on a regular basis.   Hyperlipidemia LDL is not at goal. Medication(s): Crestor  Cardiovascular risk factors: advanced age (older than 76 for men, 60 for women), diabetes mellitus, dyslipidemia, obesity (BMI >= 30 kg/m2), and sedentary lifestyle  Lab Results  Component Value Date   CHOL 195 09/17/2023   HDL 71 09/17/2023   LDLCALC 109 (H) 09/17/2023   TRIG 84 09/17/2023   Lab Results  Component Value Date   ALT 12 09/17/2023   AST 11 09/17/2023   ALKPHOS 93 09/17/2023   BILITOT 0.4 09/17/2023   The 10-year ASCVD risk score (Arnett DK, et al., 2019) is: 17.3%   Values used to calculate the score:     Age: 69 years  Clinically relevant sex: Female     Is Non-Hispanic African American: No     Diabetic: Yes     Tobacco smoker: No     Systolic Blood Pressure: 144 mmHg     Is BP treated: Yes     HDL Cholesterol: 71 mg/dL     Total Cholesterol: 195 mg/dL  Plan:  Continue statin.  Will add in some arm weights and will walk.  Will avoid all trans fats.  Will read labels Will minimize saturated fats except the following: low fat meats in moderation, diary, and limited dark chocolate.    Generalized Obesity: Current BMI BMI (Calculated): 30.88   Pharmacotherapy Plan Continue and refill  Mounjaro  15 mg SQ weekly  Crystal Roberts is currently in the action stage of change. As such, her goal is to continue with weight loss efforts.  She has agreed to the Category 2 plan.  Exercise goals: All adults should avoid inactivity. Some physical activity is better than none, and adults who participate in any amount of physical activity gain some health benefits.  Behavioral modification strategies: increasing lean protein intake, decreasing simple carbohydrates , decrease eating out, meal planning , decrease liquid  calories, increase water intake, better snacking choices, planning for success, increasing vegetables, avoiding temptations, keep healthy foods in the home, weigh protein portions, measure portion sizes, and mindful eating.  Crystal Roberts has agreed to follow-up with our clinic in 4 weeks.     Objective:   VITALS: Per patient if applicable, see vitals. GENERAL: Alert and in no acute distress. CARDIOPULMONARY: No increased WOB. Speaking in clear sentences.  PSYCH: Pleasant and cooperative. Speech normal rate and rhythm. Affect is appropriate. Insight and judgement are appropriate. Attention is focused, linear, and appropriate.  NEURO: Oriented as arrived to appointment on time with no prompting.   Attestation Statements:   This was prepared with the assistance of Engineer, Civil (consulting).  Occasional wrong-word or sound-a-like substitutions may have occurred due to the inherent limitations of voice recognition.    Clayborne Daring, DO   "

## 2024-02-17 ENCOUNTER — Telehealth: Payer: Self-pay | Admitting: Plastic Surgery

## 2024-02-17 NOTE — Telephone Encounter (Signed)
 Pt called stating that she needs to change her surgery date.

## 2024-02-19 ENCOUNTER — Ambulatory Visit: Admitting: Bariatrics

## 2024-02-27 ENCOUNTER — Encounter: Admitting: Student

## 2024-03-08 ENCOUNTER — Encounter: Admitting: Plastic Surgery

## 2024-03-12 ENCOUNTER — Ambulatory Visit (HOSPITAL_BASED_OUTPATIENT_CLINIC_OR_DEPARTMENT_OTHER): Admit: 2024-03-12 | Admitting: Plastic Surgery

## 2024-03-12 ENCOUNTER — Encounter (HOSPITAL_BASED_OUTPATIENT_CLINIC_OR_DEPARTMENT_OTHER): Payer: Self-pay

## 2024-03-12 SURGERY — PANNICULECTOMY
Anesthesia: Choice

## 2024-03-17 ENCOUNTER — Ambulatory Visit: Admitting: Bariatrics

## 2024-04-02 ENCOUNTER — Encounter
